# Patient Record
Sex: Female | Born: 1995 | Race: Black or African American | Hispanic: No | Marital: Single | Smoking: Never smoker
Health system: Southern US, Community
[De-identification: ages and names within clinical notes are randomized; demographics above are authoritative.]

## PROBLEM LIST (undated history)

## (undated) ENCOUNTER — Inpatient Hospital Stay (HOSPITAL_COMMUNITY): Payer: Self-pay

## (undated) DIAGNOSIS — F32A Depression, unspecified: Secondary | ICD-10-CM

## (undated) DIAGNOSIS — E119 Type 2 diabetes mellitus without complications: Secondary | ICD-10-CM

## (undated) DIAGNOSIS — G40909 Epilepsy, unspecified, not intractable, without status epilepticus: Secondary | ICD-10-CM

## (undated) DIAGNOSIS — F329 Major depressive disorder, single episode, unspecified: Secondary | ICD-10-CM

## (undated) HISTORY — PX: NO PAST SURGERIES: SHX2092

---
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
## 2017-03-28 LAB — OB RESULTS CONSOLE ANTIBODY SCREEN: ANTIBODY SCREEN: NEGATIVE

## 2017-03-28 LAB — SICKLE CELL SCREEN: SICKLE CELL SCREEN: NEGATIVE

## 2017-03-28 LAB — OB RESULTS CONSOLE TSH: TSH: 0.818

## 2017-03-28 LAB — OB RESULTS CONSOLE HIV ANTIBODY (ROUTINE TESTING): HIV: NONREACTIVE

## 2017-03-28 LAB — OB RESULTS CONSOLE ABO/RH: RH Type: POSITIVE

## 2017-03-28 LAB — OB RESULTS CONSOLE HGB/HCT, BLOOD
HEMATOCRIT: 39
HEMOGLOBIN: 13.1

## 2017-03-28 LAB — OB RESULTS CONSOLE RPR: RPR: NONREACTIVE

## 2017-03-28 LAB — OB RESULTS CONSOLE GC/CHLAMYDIA
CHLAMYDIA, DNA PROBE: NEGATIVE
GC PROBE AMP, GENITAL: NEGATIVE

## 2017-03-28 LAB — OB RESULTS CONSOLE RUBELLA ANTIBODY, IGM: Rubella: IMMUNE

## 2017-03-28 LAB — OB RESULTS CONSOLE PLATELET COUNT: Platelets: 264

## 2017-03-28 LAB — OB RESULTS CONSOLE HEPATITIS B SURFACE ANTIGEN: Hepatitis B Surface Ag: NEGATIVE

## 2017-05-28 NOTE — L&D Delivery Note (Signed)
Patient: Samantha Bowers MRN: [id number]  GBS status: positive, IAP given (PCN x 3 doses)  Patient is a 22 y.o. now G1P1001 s/p NSVD at [redacted]w[redacted]d, who was admitted for IOL for T2DM. S/p IOL with IV Pitocin. SROM 4h 8m prior to delivery with clar fluid.   Delivery Note At 5:56 PM a viable female was delivered via Vaginal, Spontaneous (Presentation: vertex; OA ).  APGAR: 5, 9; weight 8 lb 14.7 oz (4045 g).   Placenta status: intact, sent to pathology.  Cord: 3-vessel  with the following complications: shoulder dystocia.  Cord pH: 7.23  Head delivered OA and shoulder dystocia immediately recognized. Patient already in McRobert's, suprapubic pressure applied, then rotation with Rubins maneuver attempted, followed by attempt to deliver posterior shoulder, but with some difficulty. Dr. Charlotta Newton arrived for assistance and was able to deliver posterior shoulder, and body followed quickly. Infant bulb suctioned, and cord clamped and cut by without delay, and handed to awaiting neonatal team. Cord blood and arterial gas drawn. IV Pitocin started. Placenta delivered spontaneously with gentle cord traction.  Brisk bleeding noted, and fundus found to have poor tone. Bimanual massage done, IM methergine, misoprostol (400 mcg buccal and 400 mcg rectally) given. Tone improved with continued bimanual massage, but some continuous bleeding noted, so TXA 1g also given. 2nd degree perineal repaired with 3.0 Monocryl with good hemostasis achieved.  Anesthesia:  Epidural Episiotomy: None Lacerations: 2nd degree;Perineal Suture Repair: 3.0 Monocryl Est. Blood Loss (mL): 700  Mom to postpartum.  Baby to Couplet care / Skin to Skin.  Raynelle Fanning P. Gloris Shiroma, MD OB Fellow 10/10/17, 7:19 PM

## 2017-06-17 ENCOUNTER — Encounter (HOSPITAL_COMMUNITY): Payer: Self-pay | Admitting: Emergency Medicine

## 2017-06-17 ENCOUNTER — Other Ambulatory Visit: Payer: Self-pay

## 2017-06-17 ENCOUNTER — Emergency Department (HOSPITAL_COMMUNITY)
Admission: EM | Admit: 2017-06-17 | Discharge: 2017-06-17 | Disposition: A | Discharge status: Home / Self Care | Payer: Medicaid Other | Attending: Emergency Medicine | Admitting: Emergency Medicine

## 2017-06-17 DIAGNOSIS — K0889 Other specified disorders of teeth and supporting structures: Secondary | ICD-10-CM | POA: Diagnosis not present

## 2017-06-17 DIAGNOSIS — E119 Type 2 diabetes mellitus without complications: Secondary | ICD-10-CM | POA: Insufficient documentation

## 2017-06-17 HISTORY — DX: Type 2 diabetes mellitus without complications: E11.9

## 2017-06-17 MED ORDER — PENICILLIN V POTASSIUM 500 MG PO TABS: 500.0000 mg | ORAL_TABLET | Freq: Four times a day (QID) | ORAL | 0 refills | Status: AC

## 2017-06-17 NOTE — Discharge Instructions (Signed)
Please take Tylenol for pain Take Penicillin for possible infection. Take 4 times a day for the next week. A dental resource guide has been provided

## 2017-06-17 NOTE — ED Provider Notes (Signed)
[facility] EMERGENCY DEPARTMENT Provider Note   CSN: [id number] Arrival date & time: 06/17/17  1256     History   Chief Complaint Chief Complaint  Patient presents with  . Dental Pain    HPI Samantha Bowers is a 22 y.o. female who presents with left lower dental pain.  Patient is currently 5 months pregnant.  She states that filling fell out of her tooth about a month ago.  Over the past couple days it has been gradually worsening and she is noticed a small amount of swelling over her jawline. She has pain when she eats on that side but is able to swallow and does not have any shortness of breath. She has not taken anything for pain. She recently moved to the area and does not have a dentist.  She tried to get in with a dentist however was told that she did not have coverage through her Medicaid.  HPI  Past Medical History:  Diagnosis Date  . Diabetes mellitus without complication (HCC)     There are no active problems to display for this patient.   The histories are not reviewed yet. Please review them in the "History" navigator section and refresh this SmartLink.  OB History    Gravida Para Term Preterm AB Living   1             SAB TAB Ectopic Multiple Live Births                   Home Medications    Prior to Admission medications   Medication Sig Start Date End Date Taking? Authorizing Provider  penicillin v potassium (VEETID) 500 MG tablet Take 1 tablet (500 mg total) by mouth 4 (four) times daily for 7 days. 06/17/17 06/24/17  Bethel Born, PA-C    Family History No family history on file.  Social History Social History   Tobacco Use  . Smoking status: Never Smoker  Substance Use Topics  . Alcohol use: Not on file  . Drug use: Not on file     Allergies   Patient has no known allergies.   Review of Systems Review of Systems  Constitutional: Negative for fever.  HENT: Positive for dental problem.      Physical  Exam Updated Vital Signs BP 120/85 (BP Location: Right Arm)   Pulse 84   Temp 99.1 F (37.3 C) (Oral)   Resp 17   Ht 6' (1.829 m)   Wt 99.8 kg (220 lb)   SpO2 98%   BMI 29.84 kg/m   Physical Exam  Constitutional: She is oriented to person, place, and time. She appears well-developed and well-nourished. No distress.  HENT:  Head: Normocephalic and atraumatic.  Left lower posterior molar has filling missing. No obvious signs of infection or abscess  Eyes: Conjunctivae are normal. Pupils are equal, round, and reactive to light. Right eye exhibits no discharge. Left eye exhibits no discharge. No scleral icterus.  Neck: Normal range of motion.  Cardiovascular: Normal rate.  Pulmonary/Chest: Effort normal. No respiratory distress.  Abdominal: She exhibits no distension.  Neurological: She is alert and oriented to person, place, and time.  Skin: Skin is warm and dry.  Psychiatric: She has a normal mood and affect. Her behavior is normal.  Nursing note and vitals reviewed.    ED Treatments / Results  Labs (all labs ordered are listed, but only abnormal results are displayed) Labs Reviewed - No data to display  EKG  EKG Interpretation None       Radiology No results found.  Procedures Procedures (including critical care time)  Medications Ordered in ED Medications - No data to display   Initial Impression / Assessment and Plan / ED Course  I have reviewed the triage vital signs and the nursing notes.  Pertinent labs & imaging results that were available during my care of the patient were reviewed by me and considered in my medical decision making (see chart for details).  Dental pain due to missing filling and possible dental infection. Patient is afebrile, non toxic appearing, and swallowing secretions well. I gave patient referral to dentist and stressed the importance of dental follow up for ultimate management of dental pain. She asked me to pull her tooth or fill  it and I advised that we do not do that in the ED. She was advised to take Tylenol for pain and was given PCN rx. Patient expresses understanding and agrees with plan.   Final Clinical Impressions(s) / ED Diagnoses   Final diagnoses:  Pain, dental    ED Discharge Orders        Ordered    penicillin v potassium (VEETID) 500 MG tablet  4 times daily     06/17/17 1635       Bethel BornGekas, Kelly Marie, PA-C 06/17/17 1645    Tilden Fossaees, Elizabeth, MD 06/17/17 2041

## 2017-06-17 NOTE — ED Triage Notes (Addendum)
Pt had a cavity filling fall out 1 month ago.has pain to left lower tooth that started 2 days ago. Does not have dentist, needs referral. Currently 5 mths pregnant.

## 2017-06-17 NOTE — ED Notes (Signed)
Pt states she understands instructions. Home stable with steady gait. 

## 2017-07-02 ENCOUNTER — Encounter: Payer: Self-pay | Admitting: *Deleted

## 2017-07-12 ENCOUNTER — Ambulatory Visit (INDEPENDENT_AMBULATORY_CARE_PROVIDER_SITE_OTHER): Payer: Medicaid Other | Admitting: Obstetrics and Gynecology

## 2017-07-12 ENCOUNTER — Encounter: Payer: Self-pay | Admitting: Obstetrics and Gynecology

## 2017-07-12 ENCOUNTER — Encounter: Payer: Self-pay | Admitting: General Practice

## 2017-07-12 VITALS — BP 128/70 | HR 83 | Wt 240.6 lb

## 2017-07-12 DIAGNOSIS — O2342 Unspecified infection of urinary tract in pregnancy, second trimester: Principal | ICD-10-CM

## 2017-07-12 DIAGNOSIS — Z23 Encounter for immunization: Secondary | ICD-10-CM

## 2017-07-12 DIAGNOSIS — O2340 Unspecified infection of urinary tract in pregnancy, unspecified trimester: Secondary | ICD-10-CM

## 2017-07-12 DIAGNOSIS — E1165 Type 2 diabetes mellitus with hyperglycemia: Secondary | ICD-10-CM | POA: Insufficient documentation

## 2017-07-12 DIAGNOSIS — O98812 Other maternal infectious and parasitic diseases complicating pregnancy, second trimester: Secondary | ICD-10-CM

## 2017-07-12 DIAGNOSIS — B951 Streptococcus, group B, as the cause of diseases classified elsewhere: Secondary | ICD-10-CM | POA: Insufficient documentation

## 2017-07-12 DIAGNOSIS — O0990 Supervision of high risk pregnancy, unspecified, unspecified trimester: Secondary | ICD-10-CM | POA: Insufficient documentation

## 2017-07-12 DIAGNOSIS — O24113 Pre-existing diabetes mellitus, type 2, in pregnancy, third trimester: Secondary | ICD-10-CM

## 2017-07-12 DIAGNOSIS — O98819 Other maternal infectious and parasitic diseases complicating pregnancy, unspecified trimester: Secondary | ICD-10-CM

## 2017-07-12 DIAGNOSIS — O24111 Pre-existing diabetes mellitus, type 2, in pregnancy, first trimester: Secondary | ICD-10-CM | POA: Diagnosis not present

## 2017-07-12 DIAGNOSIS — O0991 Supervision of high risk pregnancy, unspecified, first trimester: Secondary | ICD-10-CM

## 2017-07-12 DIAGNOSIS — O2341 Unspecified infection of urinary tract in pregnancy, first trimester: Secondary | ICD-10-CM

## 2017-07-12 DIAGNOSIS — Z9119 Patient's noncompliance with other medical treatment and regimen: Secondary | ICD-10-CM

## 2017-07-12 DIAGNOSIS — O24119 Pre-existing diabetes mellitus, type 2, in pregnancy, unspecified trimester: Secondary | ICD-10-CM

## 2017-07-12 DIAGNOSIS — O98811 Other maternal infectious and parasitic diseases complicating pregnancy, first trimester: Secondary | ICD-10-CM

## 2017-07-12 DIAGNOSIS — A749 Chlamydial infection, unspecified: Secondary | ICD-10-CM

## 2017-07-12 DIAGNOSIS — Z91199 Patient's noncompliance with other medical treatment and regimen due to unspecified reason: Secondary | ICD-10-CM

## 2017-07-12 DIAGNOSIS — R7309 Other abnormal glucose: Secondary | ICD-10-CM | POA: Insufficient documentation

## 2017-07-12 LAB — POCT URINALYSIS DIP (DEVICE)
BILIRUBIN URINE: NEGATIVE
Hgb urine dipstick: NEGATIVE
Ketones, ur: NEGATIVE mg/dL
Leukocytes, UA: NEGATIVE
Nitrite: NEGATIVE
Protein, ur: NEGATIVE mg/dL
SPECIFIC GRAVITY, URINE: 1.02 (ref 1.005–1.030)
Urobilinogen, UA: 0.2 mg/dL (ref 0.0–1.0)
pH: 6.5 (ref 5.0–8.0)

## 2017-07-12 MED ORDER — INSULIN LISPRO 100 UNIT/ML CARTRIDGE: 12.0000 [IU] | Freq: Three times a day (TID) | SUBCUTANEOUS | 11 refills | Status: DC

## 2017-07-12 MED ORDER — INSULIN DETEMIR 100 UNIT/ML ~~LOC~~ SOLN: 15.0000 [IU] | Freq: Every day | SUBCUTANEOUS | Status: DC

## 2017-07-12 NOTE — Progress Notes (Signed)
New OB Note  07/12/2017   [facility]  Chief Complaint: transfer of care from [facility] [place]   History of Present Illness: [name] is a 22 y.o. G1P0000 @ 26/0 weeks (EDC 5/24, based on 8wk u/) Patient's last menstrual period was 01/10/2017 (exact date). Preg complicated by has GBS (group B streptococcus) UTI complicating pregnancy; Type 2 diabetes mellitus affecting pregnancy, antepartum; Chlamydia infection complicating pregnancy; and Supervision of high risk pregnancy, antepartum on their problem list.   No s/s of PTL or decreased FM  ROS: A 12-point review of systems was performed and negative, except as stated in the above HPI.  OBGYN History: As per HPI. OB History  Gravida Para Term Preterm AB Living  1 0 0 0 0 0  SAB TAB Ectopic Multiple Live Births  0 0 0 0 0    # Outcome Date GA Lbr Len/2nd Weight Sex Delivery Anes PTL Lv  1 Current                Past Medical History: Past Medical History:  Diagnosis Date  . Diabetes mellitus without complication [facility](HCC)     Past Surgical History: Past Surgical History:  Procedure Laterality Date  . NO PAST SURGERIES      Family History:  Family History  Problem Relation Age of Onset  . Diabetes Mother   . Diabetes Father     Social History:  Social History   Socioeconomic History  . Marital status: Single    Spouse name: Not on file  . Number of children: Not on file  . Years of education: Not on file  . Highest education level: Not on file  Social Needs  . Financial resource strain: Not on file  . Food insecurity - worry: Not on file  . Food insecurity - inability: Not on file  . Transportation needs - medical: Not on file  . Transportation needs - non-medical: Not on file  Occupational History  . Not on file  Tobacco Use  . Smoking status: Never Smoker  . Smokeless tobacco: Never Used  Substance and Sexual Activity  . Alcohol use: No    Frequency: Never  . Drug use: No  .  Sexual activity: Yes    Birth control/protection: Injection  Other Topics Concern  . Not on file  Social History Narrative  . Not on file    Allergy: No Known Allergies  Current Outpatient Medications: PN gummies humalog 05/09/11 Levemir 15 quhs  Physical Exam:   BP 128/70   Pulse 83   Wt 240 lb 9.6 oz (109.1 kg)   LMP 01/10/2017 (Exact Date)   BMI 32.63 kg/m  Body mass index is 32.63 kg/m. Contractions: Not present Vag. Bleeding: Bloody Show. Fundal height: 27 FHTs: 150s  General appearance: Well nourished, well developed female in no acute distress.   Laboratory: See care everywhere  Imaging:  See media tab. S/p normal anatomy u/s  Assessment: pt stable  Plan: 1. Supervision of high risk pregnancy, antepartum Routine care. Not on low dose ASA, too late to start at this point.   2. Type 2 diabetes mellitus affecting pregnancy, antepartum Pt only doing am fasting checks which are in the 120s. I told her re: risk of not having good BS compliance, including IUFD. I told her and sheet given am fasting and 2hr PP checks and #s to aim for. Will increase qhs levemir to 15 and pt told to check meal coverage at 12 b/c we don't  know what her PP #s are. Pt states that she didn't keep fetal echo appt in [place] so will set one up for her. Also will set her up for growth u/s here and ophtho exam. Will get a1c today. Was 8.4 on 10/19. Start ap testing at 32wks, delivery at 39wks -[facility] 4/16 @ 13:45 -dr Elizebeth Brooking 3/12 @ 11am  3. GBS bacteruria tx in labor. toc neg  4. H/o CT Negative TOC. Retest later in pregnancy  5. BMI 30s  Problem list reviewed and updated.  Follow up in 1 weeks.  >50% of 25 min visit spent on counseling and coordination of care.     Cornelia Copa MD Attending Center for [facility])

## 2017-07-12 NOTE — Progress Notes (Signed)
Fetal Echo scheduled w/ Dr Elizebeth Brookingotton on 08/06/17 @ 11am

## 2017-07-12 NOTE — Progress Notes (Signed)
Flu declined,Tdap accepted 07/12/16 in right arm @ 9:20

## 2017-07-12 NOTE — Progress Notes (Signed)
[facility] @ 145pm

## 2017-07-13 LAB — HEMOGLOBIN A1C
ESTIMATED AVERAGE GLUCOSE: 163 mg/dL
Hgb A1c MFr Bld: 7.3 % — ABNORMAL HIGH (ref 4.8–5.6)

## 2017-07-15 ENCOUNTER — Encounter (HOSPITAL_COMMUNITY): Payer: Self-pay | Admitting: Obstetrics and Gynecology

## 2017-07-18 ENCOUNTER — Ambulatory Visit (INDEPENDENT_AMBULATORY_CARE_PROVIDER_SITE_OTHER): Payer: Medicaid Other | Admitting: Obstetrics & Gynecology

## 2017-07-18 VITALS — BP 114/57 | HR 90 | Wt 245.5 lb

## 2017-07-18 DIAGNOSIS — O0990 Supervision of high risk pregnancy, unspecified, unspecified trimester: Secondary | ICD-10-CM

## 2017-07-18 DIAGNOSIS — O24119 Pre-existing diabetes mellitus, type 2, in pregnancy, unspecified trimester: Secondary | ICD-10-CM

## 2017-07-18 MED ORDER — INSULIN NPH (HUMAN) (ISOPHANE) 100 UNIT/ML ~~LOC~~ SUSP: SUBCUTANEOUS | 3 refills | Status: DC

## 2017-07-18 MED ORDER — INSULIN LISPRO 100 UNIT/ML CARTRIDGE: 15.0000 [IU] | Freq: Three times a day (TID) | SUBCUTANEOUS | 11 refills | Status: DC

## 2017-07-18 NOTE — Progress Notes (Signed)
Needs refills on insulin

## 2017-07-18 NOTE — Progress Notes (Signed)
   PRENATAL VISIT NOTE  Subjective:  Samantha Bowers is a 22 y.o. G1P0000 at 6062w0d being seen today for ongoing prenatal care.  She is currently monitored for the following issues for this high-risk pregnancy and has GBS (group B streptococcus) UTI complicating pregnancy; Type 2 diabetes mellitus affecting pregnancy, antepartum; Chlamydia infection complicating pregnancy; Supervision of high risk pregnancy, antepartum; and Noncompliance on their problem list.  Patient reports no complaints.  Contractions: Not present. Vag. Bleeding: None.  Movement: Present. Denies leaking of fluid.   The following portions of the patient's history were reviewed and updated as appropriate: allergies, current medications, past family history, past medical history, past social history, past surgical history and problem list. Problem list updated.  Objective:   Vitals:   07/18/17 0826  BP: (!) 114/57  Pulse: 90  Weight: 245 lb 8 oz (111.4 kg)    Fetal Status: Fetal Heart Rate (bpm): 145   Movement: Present     General:  Alert, oriented and cooperative. Patient is in no acute distress.  Skin: Skin is warm and dry. No rash noted.   Cardiovascular: Normal heart rate noted  Respiratory: Normal respiratory effort, no problems with respiration noted  Abdomen: Soft, gravid, appropriate for gestational age.  Pain/Pressure: Absent     Pelvic: Cervical exam deferred        Extremities: Normal range of motion.  Edema: None  Mental Status:  Normal mood and affect. Normal behavior. Normal judgment and thought content.   Assessment and Plan:  Pregnancy: G1P0000 at 6362w0d  1. Supervision of high risk pregnancy, antepartum FBS up to 150, PP above 200, change insulin dose and preparation - HIV antibody (with reflex) - RPR - CBC - insulin lispro (HUMALOG) 100 UNIT/ML cartridge; Inject 0.15 mLs (15 Units total) into the skin 3 (three) times daily with meals.  Dispense: 15 mL; Refill: 11 - insulin NPH Human (HUMULIN  N,NOVOLIN N) 100 UNIT/ML injection; 20 units [place] AC breakfast and 15 units [place] at bedtime  Dispense: 10 mL; Refill: 3  2. Type 2 diabetes mellitus affecting pregnancy, antepartum Go over dietary restrictions  Preterm labor symptoms and general obstetric precautions including but not limited to vaginal bleeding, contractions, leaking of fluid and fetal movement were reviewed in detail with the patient. Please refer to After Visit Summary for other counseling recommendations.  Return in about 1 week (around 07/25/2017).   Scheryl DarterJames Arnold, MD

## 2017-07-18 NOTE — Patient Instructions (Signed)

## 2017-07-19 ENCOUNTER — Ambulatory Visit (HOSPITAL_COMMUNITY)
Admission: RE | Admit: 2017-07-19 | Discharge: 2017-07-19 | Disposition: A | Discharge status: Home / Self Care | Payer: Medicaid Other | Source: Ambulatory Visit | Attending: Obstetrics and Gynecology | Admitting: Obstetrics and Gynecology

## 2017-07-19 ENCOUNTER — Other Ambulatory Visit: Payer: Self-pay | Admitting: Obstetrics and Gynecology

## 2017-07-19 ENCOUNTER — Encounter (HOSPITAL_COMMUNITY): Payer: Self-pay

## 2017-07-19 ENCOUNTER — Other Ambulatory Visit (HOSPITAL_COMMUNITY): Payer: Self-pay | Admitting: *Deleted

## 2017-07-19 DIAGNOSIS — A749 Chlamydial infection, unspecified: Secondary | ICD-10-CM

## 2017-07-19 DIAGNOSIS — O0990 Supervision of high risk pregnancy, unspecified, unspecified trimester: Secondary | ICD-10-CM

## 2017-07-19 DIAGNOSIS — O98819 Other maternal infectious and parasitic diseases complicating pregnancy, unspecified trimester: Secondary | ICD-10-CM

## 2017-07-19 DIAGNOSIS — Z3A27 27 weeks gestation of pregnancy: Secondary | ICD-10-CM | POA: Insufficient documentation

## 2017-07-19 DIAGNOSIS — O24119 Pre-existing diabetes mellitus, type 2, in pregnancy, unspecified trimester: Secondary | ICD-10-CM

## 2017-07-19 DIAGNOSIS — O24414 Gestational diabetes mellitus in pregnancy, insulin controlled: Secondary | ICD-10-CM

## 2017-07-19 DIAGNOSIS — Z3689 Encounter for other specified antenatal screening: Secondary | ICD-10-CM | POA: Insufficient documentation

## 2017-07-19 DIAGNOSIS — O09892 Supervision of other high risk pregnancies, second trimester: Secondary | ICD-10-CM | POA: Insufficient documentation

## 2017-07-19 DIAGNOSIS — B951 Streptococcus, group B, as the cause of diseases classified elsewhere: Secondary | ICD-10-CM

## 2017-07-19 DIAGNOSIS — O2340 Unspecified infection of urinary tract in pregnancy, unspecified trimester: Secondary | ICD-10-CM

## 2017-07-19 DIAGNOSIS — O24312 Unspecified pre-existing diabetes mellitus in pregnancy, second trimester: Secondary | ICD-10-CM | POA: Insufficient documentation

## 2017-07-19 LAB — CBC
HEMATOCRIT: 36.5 % (ref 34.0–46.6)
Hemoglobin: 11.9 g/dL (ref 11.1–15.9)
MCH: 30.3 pg (ref 26.6–33.0)
MCHC: 32.6 g/dL (ref 31.5–35.7)
MCV: 93 fL (ref 79–97)
PLATELETS: 230 10*3/uL (ref 150–379)
RBC: 3.93 x10E6/uL (ref 3.77–5.28)
RDW: 13.6 % (ref 12.3–15.4)
WBC: 7.4 10*3/uL (ref 3.4–10.8)

## 2017-07-19 LAB — HIV ANTIBODY (ROUTINE TESTING W REFLEX): HIV Screen 4th Generation wRfx: NONREACTIVE

## 2017-07-19 LAB — RPR: RPR: NONREACTIVE

## 2017-07-22 ENCOUNTER — Encounter: Payer: Self-pay | Admitting: *Deleted

## 2017-07-23 ENCOUNTER — Other Ambulatory Visit: Payer: Medicaid Other

## 2017-07-24 ENCOUNTER — Other Ambulatory Visit: Payer: Self-pay | Admitting: General Practice

## 2017-07-24 ENCOUNTER — Encounter: Payer: Self-pay | Admitting: Obstetrics & Gynecology

## 2017-07-24 DIAGNOSIS — O24119 Pre-existing diabetes mellitus, type 2, in pregnancy, unspecified trimester: Secondary | ICD-10-CM

## 2017-07-24 MED ORDER — "INSULIN SYRINGE 31G X 5/16"" 0.5 ML MISC": 1.0000 | Freq: Every day | 9 refills | Status: DC

## 2017-07-29 ENCOUNTER — Encounter: Payer: Self-pay | Admitting: Family Medicine

## 2017-07-29 ENCOUNTER — Ambulatory Visit (INDEPENDENT_AMBULATORY_CARE_PROVIDER_SITE_OTHER): Payer: Medicaid Other | Admitting: Family Medicine

## 2017-07-29 VITALS — BP 117/61 | HR 100 | Wt 250.8 lb

## 2017-07-29 DIAGNOSIS — O2340 Unspecified infection of urinary tract in pregnancy, unspecified trimester: Secondary | ICD-10-CM

## 2017-07-29 DIAGNOSIS — O24119 Pre-existing diabetes mellitus, type 2, in pregnancy, unspecified trimester: Secondary | ICD-10-CM

## 2017-07-29 DIAGNOSIS — O0990 Supervision of high risk pregnancy, unspecified, unspecified trimester: Secondary | ICD-10-CM

## 2017-07-29 DIAGNOSIS — B951 Streptococcus, group B, as the cause of diseases classified elsewhere: Secondary | ICD-10-CM

## 2017-07-29 LAB — POCT URINALYSIS DIP (DEVICE)
BILIRUBIN URINE: NEGATIVE
Glucose, UA: 500 mg/dL — AB
KETONES UR: NEGATIVE mg/dL
LEUKOCYTES UA: NEGATIVE
Nitrite: NEGATIVE
PH: 6 (ref 5.0–8.0)
Protein, ur: NEGATIVE mg/dL
Specific Gravity, Urine: 1.02 (ref 1.005–1.030)
Urobilinogen, UA: 0.2 mg/dL (ref 0.0–1.0)

## 2017-07-29 MED ORDER — INSULIN NPH (HUMAN) (ISOPHANE) 100 UNIT/ML ~~LOC~~ SUSP: SUBCUTANEOUS | 3 refills | Status: DC

## 2017-07-29 NOTE — Patient Instructions (Signed)
Increase NPH to 30units in AM and 20units in PM

## 2017-07-29 NOTE — Progress Notes (Signed)
Subjective:  Samantha Bowers is a [age] y.o. G1P0000 at 5167w4d being seen today for ongoing prenatal care.  She is currently monitored for the following issues for this high-risk pregnancy and has GBS (group B streptococcus) UTI complicating pregnancy; Type 2 diabetes mellitus affecting pregnancy, antepartum; Chlamydia infection complicating pregnancy; Supervision of high risk pregnancy, antepartum; and Noncompliance on their problem list.  GDM: Patient taking NPH and humalog.  Reports no hypoglycemic episodes.  Tolerating medication well. Patient had numbers written in phone, which broke Fasting: multiple high  2hr PP: multiple really high blood sugars 180 -200 range  Patient reports no complaints.  Contractions: Not present. Vag. Bleeding: None.  Movement: Present. Denies leaking of fluid.   The following portions of the patient's history were reviewed and updated as appropriate: allergies, current medications, past family history, past medical history, past social history, past surgical history and problem list. Problem list updated.  Objective:   Vitals:   07/29/17 1350  BP: 117/61  Pulse: 100  Weight: 250 lb 12.8 oz (113.8 kg)    Fetal Status: Fetal Heart Rate (bpm): 150   Movement: Present     General:  Alert, oriented and cooperative. Patient is in no acute distress.  Skin: Skin is warm and dry. No rash noted.   Cardiovascular: Normal heart rate noted  Respiratory: Normal respiratory effort, no problems with respiration noted  Abdomen: Soft, gravid, appropriate for gestational age. Pain/Pressure: Present     Pelvic: Vag. Bleeding: None     Cervical exam deferred        Extremities: Normal range of motion.  Edema: None  Mental Status: Normal mood and affect. Normal behavior. Normal judgment and thought content.   Urinalysis:      Assessment and Plan:  Pregnancy: G1P0000 at 3667w4d  1. Supervision of high risk pregnancy, antepartum FHT and FH normal  2. Group B Streptococcus  urinary tract infection affecting pregnancy, antepartum Intrapartum prophylaxis  3. Type 2 diabetes mellitus affecting pregnancy, antepartum Increase NPH to 30units in AM and 20units in PM Antenatal testing at 32 weeks US for growth on 3/22  Preterm labor symptoms and general obstetric precautions including but not limited to vaginal bleeding, contractions, leaking of fluid and fetal movement were reviewed in detail with the patient. Please refer to After Visit Summary for other counseling recommendations.  No Follow-up on file.   Levie HeritageStinson, Rodell Marrs J, DO

## 2017-08-01 ENCOUNTER — Encounter: Payer: Self-pay | Admitting: Family Medicine

## 2017-08-05 ENCOUNTER — Encounter: Payer: Self-pay | Admitting: Family Medicine

## 2017-08-09 ENCOUNTER — Encounter: Payer: Medicaid Other | Admitting: Family Medicine

## 2017-08-12 ENCOUNTER — Ambulatory Visit (INDEPENDENT_AMBULATORY_CARE_PROVIDER_SITE_OTHER): Payer: Medicaid Other | Admitting: Family Medicine

## 2017-08-12 VITALS — BP 118/66 | HR 92 | Wt 253.9 lb

## 2017-08-12 DIAGNOSIS — O0990 Supervision of high risk pregnancy, unspecified, unspecified trimester: Secondary | ICD-10-CM

## 2017-08-12 DIAGNOSIS — O24119 Pre-existing diabetes mellitus, type 2, in pregnancy, unspecified trimester: Secondary | ICD-10-CM

## 2017-08-12 NOTE — Progress Notes (Signed)
Subjective:  Samantha Bowers is a 22 y.o. G1P0000 at 2450w4d being seen today for ongoing prenatal care.  She is currently monitored for the following issues for this high-risk pregnancy and has GBS (group B streptococcus) UTI complicating pregnancy; Type 2 diabetes mellitus affecting pregnancy, antepartum; Chlamydia infection complicating pregnancy; Supervision of high risk pregnancy, antepartum; and Noncompliance on their problem list.  GDM: Patient taking Lispro 12 units TID with meals and NPH 30 unit AM, 20 qhs.  Reports no hypoglycemic episodes.  Tolerating medication well Fasting: 90-94 2hr PP: 150-160  Patient reports no complaints.  Contractions: Not present. Vag. Bleeding: None.  Movement: Present. Denies leaking of fluid.   The following portions of the patient's history were reviewed and updated as appropriate: allergies, current medications, past family history, past medical history, past social history, past surgical history and problem list. Problem list updated.  Objective:   Vitals:   08/12/17 1614  BP: 118/66  Pulse: 92  Weight: 253 lb 14.4 oz (115.2 kg)    Fetal Status: Fetal Heart Rate (bpm): 147   Movement: Present     General:  Alert, oriented and cooperative. Patient is in no acute distress.  Skin: Skin is warm and dry. No rash noted.   Cardiovascular: Normal heart rate noted  Respiratory: Normal respiratory effort, no problems with respiration noted  Abdomen: Soft, gravid, appropriate for gestational age. Pain/Pressure: Absent     Pelvic: Vag. Bleeding: None     Cervical exam deferred        Extremities: Normal range of motion.  Edema: Trace  Mental Status: Normal mood and affect. Normal behavior. Normal judgment and thought content.   Urinalysis:      Assessment and Plan:  Pregnancy: G1P0000 at 3450w4d  1. Type 2 diabetes mellitus affecting pregnancy, antepartum Increase lispro to 15 units with meals. Continue NPH 30/20 units. US this Friday Antenatal  testing at 32 weeks.  2. Supervision of high risk pregnancy, antepartum FH and FHT normal  Preterm labor symptoms and general obstetric precautions including but not limited to vaginal bleeding, contractions, leaking of fluid and fetal movement were reviewed in detail with the patient. Please refer to After Visit Summary for other counseling recommendations.  No Follow-up on file.   Levie HeritageStinson, Jacob J, DO

## 2017-08-13 ENCOUNTER — Encounter: Payer: Self-pay | Admitting: *Deleted

## 2017-08-13 LAB — POCT URINALYSIS DIP (DEVICE)
BILIRUBIN URINE: NEGATIVE
Glucose, UA: 500 mg/dL — AB
HGB URINE DIPSTICK: NEGATIVE
Ketones, ur: NEGATIVE mg/dL
NITRITE: NEGATIVE
PH: 7 (ref 5.0–8.0)
Protein, ur: NEGATIVE mg/dL
Specific Gravity, Urine: 1.015 (ref 1.005–1.030)
UROBILINOGEN UA: 0.2 mg/dL (ref 0.0–1.0)

## 2017-08-16 ENCOUNTER — Ambulatory Visit (HOSPITAL_COMMUNITY)
Admission: RE | Admit: 2017-08-16 | Discharge: 2017-08-16 | Disposition: A | Discharge status: Home / Self Care | Payer: Medicaid Other | Source: Ambulatory Visit | Attending: Obstetrics and Gynecology | Admitting: Obstetrics and Gynecology

## 2017-08-16 ENCOUNTER — Other Ambulatory Visit (HOSPITAL_COMMUNITY): Payer: Self-pay | Admitting: Maternal and Fetal Medicine

## 2017-08-16 ENCOUNTER — Encounter (HOSPITAL_COMMUNITY): Payer: Self-pay

## 2017-08-16 ENCOUNTER — Other Ambulatory Visit (HOSPITAL_COMMUNITY): Payer: Self-pay | Admitting: *Deleted

## 2017-08-16 DIAGNOSIS — Z0489 Encounter for examination and observation for other specified reasons: Secondary | ICD-10-CM

## 2017-08-16 DIAGNOSIS — O24313 Unspecified pre-existing diabetes mellitus in pregnancy, third trimester: Secondary | ICD-10-CM | POA: Insufficient documentation

## 2017-08-16 DIAGNOSIS — Z362 Encounter for other antenatal screening follow-up: Secondary | ICD-10-CM | POA: Diagnosis not present

## 2017-08-16 DIAGNOSIS — O24414 Gestational diabetes mellitus in pregnancy, insulin controlled: Secondary | ICD-10-CM | POA: Diagnosis present

## 2017-08-16 DIAGNOSIS — IMO0002 Reserved for concepts with insufficient information to code with codable children: Secondary | ICD-10-CM

## 2017-08-16 DIAGNOSIS — Z3A31 31 weeks gestation of pregnancy: Secondary | ICD-10-CM | POA: Insufficient documentation

## 2017-08-22 ENCOUNTER — Ambulatory Visit: Payer: Self-pay

## 2017-08-22 ENCOUNTER — Encounter: Payer: Self-pay | Admitting: Family Medicine

## 2017-08-22 ENCOUNTER — Ambulatory Visit (INDEPENDENT_AMBULATORY_CARE_PROVIDER_SITE_OTHER): Payer: Medicaid Other | Admitting: Family Medicine

## 2017-08-22 VITALS — BP 100/81 | HR 103 | Wt 251.0 lb

## 2017-08-22 DIAGNOSIS — O24113 Pre-existing diabetes mellitus, type 2, in pregnancy, third trimester: Secondary | ICD-10-CM

## 2017-08-22 DIAGNOSIS — O24119 Pre-existing diabetes mellitus, type 2, in pregnancy, unspecified trimester: Secondary | ICD-10-CM

## 2017-08-22 DIAGNOSIS — O0990 Supervision of high risk pregnancy, unspecified, unspecified trimester: Secondary | ICD-10-CM

## 2017-08-22 DIAGNOSIS — O2343 Unspecified infection of urinary tract in pregnancy, third trimester: Secondary | ICD-10-CM | POA: Diagnosis not present

## 2017-08-22 DIAGNOSIS — O2340 Unspecified infection of urinary tract in pregnancy, unspecified trimester: Secondary | ICD-10-CM

## 2017-08-22 DIAGNOSIS — O0993 Supervision of high risk pregnancy, unspecified, third trimester: Secondary | ICD-10-CM | POA: Diagnosis present

## 2017-08-22 DIAGNOSIS — B951 Streptococcus, group B, as the cause of diseases classified elsewhere: Secondary | ICD-10-CM

## 2017-08-22 LAB — POCT URINALYSIS DIP (DEVICE)
Bilirubin Urine: NEGATIVE
GLUCOSE, UA: 500 mg/dL — AB
Hgb urine dipstick: NEGATIVE
KETONES UR: NEGATIVE mg/dL
Nitrite: NEGATIVE
PROTEIN: NEGATIVE mg/dL
SPECIFIC GRAVITY, URINE: 1.01 (ref 1.005–1.030)
UROBILINOGEN UA: 0.2 mg/dL (ref 0.0–1.0)
pH: 7 (ref 5.0–8.0)

## 2017-08-22 MED ORDER — GLUCOSE BLOOD VI STRP: ORAL_STRIP | 12 refills | Status: DC

## 2017-08-22 NOTE — Progress Notes (Signed)
Subjective:  Samantha Bowers is a 22 y.o. G1P0000 at 6238w0d being seen today for ongoing prenatal care.  She is currently monitored for the following issues for this high-risk pregnancy and has GBS (group B streptococcus) UTI complicating pregnancy; Type 2 diabetes mellitus affecting pregnancy, antepartum; Chlamydia infection complicating pregnancy; Supervision of high risk pregnancy, antepartum; and Noncompliance on their problem list.  GDM: Patient taking insulin lispro 15 ac, NPH 30/20.  Reports no hypoglycemic episodes.  Tolerating medication well. Has not been checking her blood sugar because she ran out of test strips.  Patient reports no complaints.   .  .   . Denies leaking of fluid.   The following portions of the patient's history were reviewed and updated as appropriate: allergies, current medications, past family history, past medical history, past social history, past surgical history and problem list. Problem list updated.  Objective:   Vitals:   08/22/17 1327  BP: 100/81  Pulse: (!) 103  Weight: 251 lb (113.9 kg)    Fetal Status:           General:  Alert, oriented and cooperative. Patient is in no acute distress.  Skin: Skin is warm and dry. No rash noted.   Cardiovascular: Normal heart rate noted  Respiratory: Normal respiratory effort, no problems with respiration noted  Abdomen: Soft, gravid, appropriate for gestational age.       Pelvic:       Cervical exam deferred        Extremities: Normal range of motion.     Mental Status: Normal mood and affect. Normal behavior. Normal judgment and thought content.   Urinalysis:      Assessment and Plan:  Pregnancy: G1P0000 at 5438w0d  1. Supervision of high risk pregnancy, antepartum Had mucus discharge streaked with blood. FHT and FH normal   2. Type 2 diabetes mellitus affecting pregnancy, antepartum Test strips refilled. Discussed that she should have called for refill.  Testing today. BPP 10/10  3. Group B  Streptococcus urinary tract infection affecting pregnancy, antepartum Intrapartum prophylaxis.   Preterm labor symptoms and general obstetric precautions including but not limited to vaginal bleeding, contractions, leaking of fluid and fetal movement were reviewed in detail with the patient. Please refer to After Visit Summary for other counseling recommendations.  No follow-ups on file.   Levie HeritageStinson, Jacob J, DO

## 2017-08-22 NOTE — Progress Notes (Signed)
Pt c/o a mucous discharge last week and spotting 2 days ago. No further bleeding since then. Needs test strips, has not been able to take her sugars since last week.

## 2017-08-29 ENCOUNTER — Ambulatory Visit (INDEPENDENT_AMBULATORY_CARE_PROVIDER_SITE_OTHER): Payer: Medicaid Other | Admitting: *Deleted

## 2017-08-29 ENCOUNTER — Ambulatory Visit: Payer: Self-pay

## 2017-08-29 ENCOUNTER — Ambulatory Visit (INDEPENDENT_AMBULATORY_CARE_PROVIDER_SITE_OTHER): Payer: Medicaid Other | Admitting: Obstetrics & Gynecology

## 2017-08-29 ENCOUNTER — Other Ambulatory Visit (HOSPITAL_COMMUNITY)
Admission: RE | Admit: 2017-08-29 | Discharge: 2017-08-29 | Disposition: A | Discharge status: Home / Self Care | Payer: Medicaid Other | Source: Ambulatory Visit | Attending: Obstetrics & Gynecology | Admitting: Obstetrics & Gynecology

## 2017-08-29 VITALS — BP 113/60 | HR 132 | Wt 255.7 lb

## 2017-08-29 DIAGNOSIS — O0990 Supervision of high risk pregnancy, unspecified, unspecified trimester: Secondary | ICD-10-CM | POA: Diagnosis present

## 2017-08-29 DIAGNOSIS — O24119 Pre-existing diabetes mellitus, type 2, in pregnancy, unspecified trimester: Secondary | ICD-10-CM

## 2017-08-29 DIAGNOSIS — Z3A00 Weeks of gestation of pregnancy not specified: Secondary | ICD-10-CM | POA: Diagnosis not present

## 2017-08-29 DIAGNOSIS — N898 Other specified noninflammatory disorders of vagina: Secondary | ICD-10-CM | POA: Diagnosis present

## 2017-08-29 DIAGNOSIS — O98819 Other maternal infectious and parasitic diseases complicating pregnancy, unspecified trimester: Secondary | ICD-10-CM | POA: Insufficient documentation

## 2017-08-29 DIAGNOSIS — O24113 Pre-existing diabetes mellitus, type 2, in pregnancy, third trimester: Secondary | ICD-10-CM | POA: Diagnosis present

## 2017-08-29 DIAGNOSIS — O26899 Other specified pregnancy related conditions, unspecified trimester: Secondary | ICD-10-CM | POA: Diagnosis present

## 2017-08-29 DIAGNOSIS — B373 Candidiasis of vulva and vagina: Secondary | ICD-10-CM | POA: Insufficient documentation

## 2017-08-29 DIAGNOSIS — Z9119 Patient's noncompliance with other medical treatment and regimen: Secondary | ICD-10-CM

## 2017-08-29 DIAGNOSIS — Z91199 Patient's noncompliance with other medical treatment and regimen due to unspecified reason: Secondary | ICD-10-CM

## 2017-08-29 MED ORDER — INSULIN NPH (HUMAN) (ISOPHANE) 100 UNIT/ML ~~LOC~~ SUSP: SUBCUTANEOUS | 3 refills | Status: DC

## 2017-08-29 MED ORDER — INSULIN LISPRO 100 UNIT/ML CARTRIDGE: 18.0000 [IU] | Freq: Three times a day (TID) | SUBCUTANEOUS | 11 refills | Status: DC

## 2017-08-29 NOTE — Progress Notes (Signed)

## 2017-08-29 NOTE — Progress Notes (Signed)
Swab obtained for dx of vaginal irritation

## 2017-08-29 NOTE — Progress Notes (Signed)
Pt reports vaginal irritation, itching and spotting x2 weeks. Pt has US for growth scheduled 4/19, BPP added

## 2017-08-29 NOTE — Patient Instructions (Signed)

## 2017-08-29 NOTE — Progress Notes (Signed)
   PRENATAL VISIT NOTE  Subjective:  Samantha Bowers is a 22 y.o. G1P0000 at 4355w0d being seen today for ongoing prenatal care.  She is currently monitored for the following issues for this high-risk pregnancy and has GBS (group B streptococcus) UTI complicating pregnancy; Type 2 diabetes mellitus affecting pregnancy, antepartum; Chlamydia infection complicating pregnancy; Supervision of high risk pregnancy, antepartum; and Noncompliance on their problem list.  Patient reports no complaints.  Contractions: Irregular. Vag. Bleeding: Scant.  Movement: Present. Denies leaking of fluid.   The following portions of the patient's history were reviewed and updated as appropriate: allergies, current medications, past family history, past medical history, past social history, past surgical history and problem list. Problem list updated.  Objective:   Vitals:   08/29/17 1320  BP: 113/60  Pulse: (!) 132  Weight: 255 lb 11.2 oz (116 kg)    Fetal Status: Fetal Heart Rate (bpm): NST   Movement: Present     General:  Alert, oriented and cooperative. Patient is in no acute distress.  Skin: Skin is warm and dry. No rash noted.   Cardiovascular: Normal heart rate noted  Respiratory: Normal respiratory effort, no problems with respiration noted  Abdomen: Soft, gravid, appropriate for gestational age.  Pain/Pressure: Absent     Pelvic: Cervical exam deferred        Extremities: Normal range of motion.  Edema: None  Mental Status: Normal mood and affect. Normal behavior. Normal judgment and thought content.   Assessment and Plan:  Pregnancy: G1P0000 at 3955w0d  1. Supervision of high risk pregnancy, antepartum  - US MFM FETAL BPP WO NON STRESS; Future - insulin lispro (HUMALOG) 100 UNIT/ML cartridge; Inject 0.18 mLs (18 Units total) into the skin 3 (three) times daily with meals.  Dispense: 15 mL; Refill: 11 - insulin NPH Human (HUMULIN N,NOVOLIN N) 100 UNIT/ML injection; 34 units [place] AC breakfast and 24  units [place] at bedtime  Dispense: 10 mL; Refill: 3  2. Type 2 diabetes mellitus affecting pregnancy, antepartum [id number], up to 368 - US MFM FETAL BPP WO NON STRESS; Future  3. Noncompliance Instructed QID BG check  Preterm labor symptoms and general obstetric precautions including but not limited to vaginal bleeding, contractions, leaking of fluid and fetal movement were reviewed in detail with the patient. Please refer to After Visit Summary for other counseling recommendations.  Return in about 1 week (around 09/05/2017) for NST/BPP and HOB; in 3 weeks (approx 4/25) needs NST/BPP and HOB.  Future Appointments  Date Time Provider Department Center  09/13/2017 10:00 AM [facility] US 3 [facility]-US    Scheryl DarterJames Arnold, MD

## 2017-08-30 ENCOUNTER — Encounter: Payer: Self-pay | Admitting: Obstetrics & Gynecology

## 2017-08-30 LAB — CERVICOVAGINAL ANCILLARY ONLY
Bacterial vaginitis: NEGATIVE
Candida vaginitis: POSITIVE — AB
TRICH (WINDOWPATH): NEGATIVE

## 2017-09-02 ENCOUNTER — Other Ambulatory Visit: Payer: Self-pay | Admitting: Obstetrics & Gynecology

## 2017-09-02 DIAGNOSIS — O24119 Pre-existing diabetes mellitus, type 2, in pregnancy, unspecified trimester: Secondary | ICD-10-CM

## 2017-09-02 MED ORDER — FLUCONAZOLE 150 MG PO TABS: 150.0000 mg | ORAL_TABLET | Freq: Once | ORAL | 0 refills | Status: AC

## 2017-09-02 NOTE — Progress Notes (Signed)
difluc

## 2017-09-04 ENCOUNTER — Ambulatory Visit (INDEPENDENT_AMBULATORY_CARE_PROVIDER_SITE_OTHER): Payer: Medicaid Other | Admitting: Obstetrics & Gynecology

## 2017-09-04 ENCOUNTER — Ambulatory Visit: Payer: Self-pay

## 2017-09-04 ENCOUNTER — Ambulatory Visit (INDEPENDENT_AMBULATORY_CARE_PROVIDER_SITE_OTHER): Payer: Medicaid Other | Admitting: *Deleted

## 2017-09-04 VITALS — BP 120/66 | HR 97 | Wt 259.0 lb

## 2017-09-04 DIAGNOSIS — O24119 Pre-existing diabetes mellitus, type 2, in pregnancy, unspecified trimester: Secondary | ICD-10-CM

## 2017-09-04 DIAGNOSIS — O0993 Supervision of high risk pregnancy, unspecified, third trimester: Secondary | ICD-10-CM

## 2017-09-04 DIAGNOSIS — O2343 Unspecified infection of urinary tract in pregnancy, third trimester: Secondary | ICD-10-CM

## 2017-09-04 DIAGNOSIS — O24113 Pre-existing diabetes mellitus, type 2, in pregnancy, third trimester: Secondary | ICD-10-CM

## 2017-09-04 DIAGNOSIS — O0990 Supervision of high risk pregnancy, unspecified, unspecified trimester: Secondary | ICD-10-CM

## 2017-09-04 DIAGNOSIS — B951 Streptococcus, group B, as the cause of diseases classified elsewhere: Secondary | ICD-10-CM

## 2017-09-04 LAB — POCT URINALYSIS DIP (DEVICE)
BILIRUBIN URINE: NEGATIVE
GLUCOSE, UA: 250 mg/dL — AB
HGB URINE DIPSTICK: NEGATIVE
Ketones, ur: NEGATIVE mg/dL
NITRITE: NEGATIVE
Protein, ur: NEGATIVE mg/dL
Specific Gravity, Urine: 1.01 (ref 1.005–1.030)
UROBILINOGEN UA: 0.2 mg/dL (ref 0.0–1.0)
pH: 6.5 (ref 5.0–8.0)

## 2017-09-04 NOTE — Progress Notes (Signed)

## 2017-09-04 NOTE — Progress Notes (Signed)
I have reviewed this chart and agree with the RN/CMA assessment and management.    Dennie Moltz C Merari Pion, MD, FACOG Attending Physician, [facility]  

## 2017-09-04 NOTE — Progress Notes (Signed)
US for growth and BPP scheduled on 4/19.  Pt states she increased her dose of insulin to 20 units @ meals TID due to elevated blood sugar.

## 2017-09-04 NOTE — Progress Notes (Addendum)
   PRENATAL VISIT NOTE  Subjective:  Samantha Bowers is a 22 y.o. G1P0000 at 6264w6d being seen today for ongoing prenatal care.  She is currently monitored for the following issues for this high-risk pregnancy and has GBS (group B streptococcus) UTI complicating pregnancy; Type 2 diabetes mellitus affecting pregnancy, antepartum; Chlamydia infection complicating pregnancy; Supervision of high risk pregnancy, antepartum; and Noncompliance on their problem list.  Patient reports no complaints.  Contractions: Irregular. Vag. Bleeding: None.  Movement: Present. Denies leaking of fluid.   The following portions of the patient's history were reviewed and updated as appropriate: allergies, current medications, past family history, past medical history, past social history, past surgical history and problem list. Problem list updated.  Objective:   Vitals:   09/04/17 0852  BP: 120/66  Pulse: 97  Weight: 259 lb (117.5 kg)  Her recorded sugars are consistently very high, high 100s, some 200s  Fetal Status: Fetal Heart Rate (bpm): NST   Movement: Present     General:  Alert, oriented and cooperative. Patient is in no acute distress.  Skin: Skin is warm and dry. No rash noted.   Cardiovascular: Normal heart rate noted  Respiratory: Normal respiratory effort, no problems with respiration noted  Abdomen: Soft, gravid, appropriate for gestational age.  Pain/Pressure: Absent     Pelvic: Cervical exam deferred        Extremities: Normal range of motion.     Mental Status: Normal mood and affect. Normal behavior. Normal judgment and thought content.   Assessment and Plan:  Pregnancy: G1P0000 at 5464w6d  1. Type 2 diabetes mellitus affecting pregnancy, antepartum - MFM u/s next week - Good diet rec'd - She agrees to a repeat referral to a dietician - Diane also discussed meal planning, appropriate foods - Recommend increase hs NPH from 24 to 28  - Recommend increase mealtime insulin from 20 to  24  2. Supervision of high risk pregnancy, antepartum   3. Group B Streptococcus urinary tract infection affecting pregnancy in third trimester - treat in labor  Preterm labor symptoms and general obstetric precautions including but not limited to vaginal bleeding, contractions, leaking of fluid and fetal movement were reviewed in detail with the patient. Please refer to After Visit Summary for other counseling recommendations.  No follow-ups on file.  Future Appointments  Date Time Provider Department Center  09/13/2017 10:00 AM [facility] US 3 [facility]-US  09/19/2017  8:15 AM [facility] NST [facility]  09/19/2017  9:15 AM [place] BingPickens, Charlie, MD [facility] WOC    Allie BossierMyra C Tammela Bales, MD

## 2017-09-08 ENCOUNTER — Encounter: Payer: Self-pay | Admitting: Obstetrics & Gynecology

## 2017-09-11 ENCOUNTER — Other Ambulatory Visit: Payer: Medicaid Other

## 2017-09-12 ENCOUNTER — Ambulatory Visit (INDEPENDENT_AMBULATORY_CARE_PROVIDER_SITE_OTHER): Payer: Medicaid Other | Admitting: Obstetrics & Gynecology

## 2017-09-12 ENCOUNTER — Ambulatory Visit (INDEPENDENT_AMBULATORY_CARE_PROVIDER_SITE_OTHER): Payer: Medicaid Other | Admitting: *Deleted

## 2017-09-12 ENCOUNTER — Other Ambulatory Visit: Payer: Medicaid Other

## 2017-09-12 ENCOUNTER — Ambulatory Visit: Payer: Medicaid Other | Admitting: *Deleted

## 2017-09-12 ENCOUNTER — Encounter: Payer: Medicaid Other | Attending: Obstetrics & Gynecology | Admitting: *Deleted

## 2017-09-12 VITALS — BP 131/73 | HR 81 | Wt 263.0 lb

## 2017-09-12 DIAGNOSIS — O24119 Pre-existing diabetes mellitus, type 2, in pregnancy, unspecified trimester: Secondary | ICD-10-CM

## 2017-09-12 DIAGNOSIS — O2343 Unspecified infection of urinary tract in pregnancy, third trimester: Secondary | ICD-10-CM

## 2017-09-12 DIAGNOSIS — B951 Streptococcus, group B, as the cause of diseases classified elsewhere: Secondary | ICD-10-CM

## 2017-09-12 DIAGNOSIS — O24113 Pre-existing diabetes mellitus, type 2, in pregnancy, third trimester: Secondary | ICD-10-CM

## 2017-09-12 DIAGNOSIS — Z3A00 Weeks of gestation of pregnancy not specified: Secondary | ICD-10-CM | POA: Insufficient documentation

## 2017-09-12 DIAGNOSIS — Z713 Dietary counseling and surveillance: Secondary | ICD-10-CM | POA: Diagnosis not present

## 2017-09-12 DIAGNOSIS — O26893 Other specified pregnancy related conditions, third trimester: Secondary | ICD-10-CM

## 2017-09-12 DIAGNOSIS — R300 Dysuria: Secondary | ICD-10-CM

## 2017-09-12 DIAGNOSIS — O0990 Supervision of high risk pregnancy, unspecified, unspecified trimester: Secondary | ICD-10-CM

## 2017-09-12 LAB — POCT URINALYSIS DIP (DEVICE)
Bilirubin Urine: NEGATIVE
GLUCOSE, UA: NEGATIVE mg/dL
HGB URINE DIPSTICK: NEGATIVE
Ketones, ur: NEGATIVE mg/dL
NITRITE: NEGATIVE
Protein, ur: NEGATIVE mg/dL
Specific Gravity, Urine: 1.01 (ref 1.005–1.030)
UROBILINOGEN UA: 0.2 mg/dL (ref 0.0–1.0)
pH: 6.5 (ref 5.0–8.0)

## 2017-09-12 MED ORDER — INSULIN NPH (HUMAN) (ISOPHANE) 100 UNIT/ML ~~LOC~~ SUSP: SUBCUTANEOUS | 3 refills | Status: DC

## 2017-09-12 MED ORDER — INSULIN LISPRO 100 UNIT/ML CARTRIDGE: 28.0000 [IU] | Freq: Three times a day (TID) | SUBCUTANEOUS | 11 refills | Status: DC

## 2017-09-12 NOTE — Progress Notes (Signed)
Pt reports painful and frequent urination.  US for growth and BPP scheduled 4/19

## 2017-09-12 NOTE — Progress Notes (Signed)
   PRENATAL VISIT NOTE  Subjective:  Samantha Bowers is a 22 y.o. G1P0000 at 4539w0d being seen today for ongoing prenatal care.  She is currently monitored for the following issues for this high-risk pregnancy and has GBS (group B streptococcus) UTI complicating pregnancy; Type 2 diabetes mellitus affecting pregnancy, antepartum; Chlamydia infection complicating pregnancy; Supervision of high risk pregnancy, antepartum; and Noncompliance on their problem list.  Patient reports no complaints.  Contractions: Irregular. Vag. Bleeding: None.  Movement: Present. Denies leaking of fluid.   The following portions of the patient's history were reviewed and updated as appropriate: allergies, current medications, past family history, past medical history, past social history, past surgical history and problem list. Problem list updated.  Objective:   Vitals:   09/12/17 0936  BP: 131/73  Pulse: 81  Weight: 263 lb (119.3 kg)    Fetal Status: Fetal Heart Rate (bpm): NST   Movement: Present     General:  Alert, oriented and cooperative. Patient is in no acute distress.  Skin: Skin is warm and dry. No rash noted.   Cardiovascular: Normal heart rate noted  Respiratory: Normal respiratory effort, no problems with respiration noted  Abdomen: Soft, gravid, appropriate for gestational age.  Pain/Pressure: Present     Pelvic: Cervical exam deferred        Extremities: Normal range of motion.     Mental Status: Normal mood and affect. Normal behavior. Normal judgment and thought content.   Assessment and Plan:  Pregnancy: G1P0000 at 4939w0d  1. Supervision of high risk pregnancy, antepartum Good fetal surveillance today and US in MFM tomorrow - Culture, OB Urine - insulin lispro (HUMALOG) 100 UNIT/ML cartridge; Inject 0.28 mLs (28 Units total) into the skin 3 (three) times daily with meals.  Dispense: 15 mL; Refill: 11 - insulin NPH Human (HUMULIN N,NOVOLIN N) 100 UNIT/ML injection; 36 units [place] AC  breakfast and 32 units [place] at bedtime  Dispense: 10 mL; Refill: 3  2. Type 2 diabetes mellitus affecting pregnancy, antepartum FBS up to 120 and PP up to 240, poor control, dose adjusted  3. Group B Streptococcus urinary tract infection affecting pregnancy in third trimester   4. Dysuria during pregnancy in third trimester dysuria - Culture, OB Urine  Preterm labor symptoms and general obstetric precautions including but not limited to vaginal bleeding, contractions, leaking of fluid and fetal movement were reviewed in detail with the patient. Please refer to After Visit Summary for other counseling recommendations.  Return in about 1 week (around 09/19/2017) for as scheduled.  Future Appointments  Date Time Provider Department Center  09/12/2017 11:00 AM [facility]-EDUCATION [facility]OC  09/13/2017 10:00 AM [facility] US 3 [facility]-US  09/19/2017  8:15 AM [facility] NST [facility] WOC  09/19/2017  9:15 AM [place] BingPickens, Charlie, MD [facility]OC  09/26/2017  9:15 AM [facility] NST [facility] WOC  09/26/2017 10:15 AM Allie Bossierove, Myra C, MD [facility] WOC  10/03/2017  9:15 AM [facility]A NST [facility] WOC  10/03/2017 10:15 AM Allie Bossierove, Myra C, MD [facility]A WOC  10/10/2017  9:15 AM [facility]OCA NST [facility]-WOCA WOC  10/10/2017 10:15 AM Allie Bossierove, Myra C, MD [facility]A WOC  10/17/2017  9:15 AM [facility]A NST [facility] WOC  10/17/2017 10:15 AM Allie Bossierove, Myra C, MD [facility] WOC    Scheryl DarterJames Arnold, MD

## 2017-09-12 NOTE — Progress Notes (Signed)
Patient was seen on 09/12/2017 for type 2 Diabetes and pregnancy self-management. EDD 10/17/2017. Patient states history of this diabetes for about 4 years, she was in high school when she was diagnosed.  Diet history obtained. Patient eats good variety of all food groups along with some concentrated carb foods such as pasta and flavored oatmeal. Beverages include water, OJ @ 12 oz, fruit punch and regular soda.  Her comfort level with carb counting is 0/10. Patient states she is 6' tall so her calorie needs are higher than average. Upon review of Log sheet, all BG are above target ranges. Patient is currently on NPH and Humalog insulin as her diabetes medications.   Insulin doses were increased by MD today to: AM: 28 units Humalog         36 units NPH Pre-supper: 28 units Humalog Pre-bedtime: 32 units NPH  The following learning objectives were met by the patient :   States the definition of  Type 2 Diabetes and pregnancy   States why dietary management is important in controlling blood glucose  Describes the effects of carbohydrates on blood glucose levels  Demonstrates ability to create a balanced meal plan  Demonstrates carbohydrate counting   States when to check blood glucose levels  Demonstrates proper blood glucose monitoring techniques  States the effect of stress and exercise on blood glucose levels  States the importance of limiting caffeine and abstaining from alcohol and smoking  Plan:   Aim for 4 Carb Choices per meal (45 grams) +/- 1 either way   Aim for 1-2 Carbs per snack  Continue reading food labels for Total Carbohydrate of foods  Consider  increasing your activity level by walking or other activity daily as tolerated  Continue checking BG before breakfast and 2 hours after first bite of breakfast, lunch and dinner as directed by MD   Bring Log Book/Sheet to every medical appointment    Take medication as directed by MD  Patient instructed to monitor  glucose levels: FBS: 60 - 95 mg/dl 2 hour: <120 mg/dl  Patient received the following handouts:  Nutrition Diabetes and Pregnancy  Carbohydrate Counting List  Patient will be seen for follow-up as needed.

## 2017-09-12 NOTE — Patient Instructions (Signed)
Type 1 or Type 2 Diabetes Mellitus During Pregnancy, Diagnosis Type 1 diabetes (type 1 diabetes mellitus) and type 2 diabetes (type 2 diabetes mellitus) are long-term (chronic) diseases. Your diabetes may be caused by one or both of these problems:  Your body does not make enough of a hormone called insulin.  Your body does not respond in a normal way to insulin that it makes.  Insulin lets sugars (glucose) go into cells in the body. This gives you energy. If you have diabetes, sugars cannot get into cells. This causes high blood sugar (hyperglycemia). If diabetes is treated, it may not hurt you or your baby. Your doctor will set treatment goals for you. In general, you should have these blood sugar levels:  After not eating for a long time (fasting): 95 mg/dL (5.3 mmol/L).  After meals (postprandial): ? One hour after a meal: at or below 140 mg/dL (7.8 mmol/L). ? Two hours after a meal: at or below 120 mg/dL (6.7 mmol/L).  A1c (hemoglobin A1c) level: 6-6.5%.  Follow these instructions at home: Questions to Ask Your Doctor  You may want to ask these questions:  Do I need to meet with a diabetes educator?  Where can I find a support group for people with diabetes?  What equipment will I need to care for myself at home?  What diabetes medicines do I need? When should I take them?  How often do I need to check my blood sugar?  What number can I call if I have questions?  When is my next doctor's visit?  General instructions  Take over-the-counter and prescription medicines only as told by your doctor.  Stay at a healthy weight during pregnancy.  Keep all follow-up visits as told by your doctor. This is important. Contact a doctor if:  Your blood sugar is at or above 240 mg/dL (13.3 mmol/L).  Your blood sugar is at or above 200 mg/dL (11.1 mmol/L), and you have ketones in your pee (urine).  You have been sick or have had a fever for 2 days or more, and you are not  getting better.  You have any of these problems for more than 6 hours: ? You cannot eat or drink. ? You feel sick to your stomach (nauseous). ? You throw up (vomit). ? You have watery poop (diarrhea). Get help right away if:  Your blood sugar is lower than 54 mg/dL (3 mmol/L).  You get confused.  You have trouble: ? Thinking clearly. ? Breathing.  Your baby moves less than normal.  You have: ? Moderate or large ketone levels in your pee (urine). ? Bleeding from your vagina. ? Unusual fluid coming from your vagina. ? Early contractions. These may feel like tightness in your belly. This information is not intended to replace advice given to you by your health care provider. Make sure you discuss any questions you have with your health care provider. Document Released: 09/05/2015 Document Revised: 03/28/2016 Document Reviewed: 06/17/2015 Elsevier Interactive Patient Education  2017 [employer] Inc[employer]  

## 2017-09-13 ENCOUNTER — Other Ambulatory Visit: Payer: Self-pay | Admitting: Obstetrics & Gynecology

## 2017-09-13 ENCOUNTER — Ambulatory Visit (HOSPITAL_COMMUNITY)
Admission: RE | Admit: 2017-09-13 | Discharge: 2017-09-13 | Disposition: A | Discharge status: Home / Self Care | Payer: Medicaid Other | Source: Ambulatory Visit | Attending: Obstetrics and Gynecology | Admitting: Obstetrics and Gynecology

## 2017-09-13 ENCOUNTER — Other Ambulatory Visit (HOSPITAL_COMMUNITY): Payer: Self-pay | Admitting: Obstetrics and Gynecology

## 2017-09-13 ENCOUNTER — Encounter (HOSPITAL_COMMUNITY): Payer: Self-pay

## 2017-09-13 DIAGNOSIS — O24414 Gestational diabetes mellitus in pregnancy, insulin controlled: Secondary | ICD-10-CM

## 2017-09-13 DIAGNOSIS — O0990 Supervision of high risk pregnancy, unspecified, unspecified trimester: Secondary | ICD-10-CM

## 2017-09-13 DIAGNOSIS — Z3A35 35 weeks gestation of pregnancy: Secondary | ICD-10-CM

## 2017-09-13 DIAGNOSIS — Z362 Encounter for other antenatal screening follow-up: Secondary | ICD-10-CM

## 2017-09-13 DIAGNOSIS — O24119 Pre-existing diabetes mellitus, type 2, in pregnancy, unspecified trimester: Secondary | ICD-10-CM

## 2017-09-13 DIAGNOSIS — O24113 Pre-existing diabetes mellitus, type 2, in pregnancy, third trimester: Secondary | ICD-10-CM | POA: Diagnosis not present

## 2017-09-16 ENCOUNTER — Encounter: Payer: Self-pay | Admitting: Obstetrics & Gynecology

## 2017-09-16 LAB — CULTURE, OB URINE

## 2017-09-16 LAB — URINE CULTURE, OB REFLEX

## 2017-09-17 ENCOUNTER — Other Ambulatory Visit: Payer: Self-pay

## 2017-09-17 ENCOUNTER — Encounter (HOSPITAL_COMMUNITY): Payer: Self-pay

## 2017-09-17 ENCOUNTER — Inpatient Hospital Stay (HOSPITAL_COMMUNITY)
Admission: AD | Admit: 2017-09-17 | Discharge: 2017-09-18 | Disposition: A | Discharge status: Home / Self Care | Payer: Medicaid Other | Source: Ambulatory Visit | Attending: Obstetrics and Gynecology | Admitting: Obstetrics and Gynecology

## 2017-09-17 DIAGNOSIS — N858 Other specified noninflammatory disorders of uterus: Secondary | ICD-10-CM

## 2017-09-17 DIAGNOSIS — N859 Noninflammatory disorder of uterus, unspecified: Secondary | ICD-10-CM

## 2017-09-17 DIAGNOSIS — O4693 Antepartum hemorrhage, unspecified, third trimester: Secondary | ICD-10-CM

## 2017-09-17 DIAGNOSIS — O368130 Decreased fetal movements, third trimester, not applicable or unspecified: Secondary | ICD-10-CM

## 2017-09-17 MED ORDER — CEPHALEXIN 500 MG PO CAPS: 500.0000 mg | ORAL_CAPSULE | Freq: Four times a day (QID) | ORAL | 0 refills | Status: DC

## 2017-09-17 NOTE — MAU Note (Addendum)
Light spotting for the past 2 days-only noticed when she wipes.  Has been losing her mucous plug over the past few weeks as well.  Last felt baby move this morning around 1100.  Type 2 diabetic.  Noticed 1 contraction yesterday.  No LOF. Last appointment was Thursday.  Diagnosed with UTI in office last week-started prescription today.

## 2017-09-18 LAB — URINALYSIS, ROUTINE W REFLEX MICROSCOPIC
BILIRUBIN URINE: NEGATIVE
HGB URINE DIPSTICK: NEGATIVE
Ketones, ur: NEGATIVE mg/dL
NITRITE: NEGATIVE
PH: 7 (ref 5.0–8.0)
Protein, ur: NEGATIVE mg/dL
SPECIFIC GRAVITY, URINE: 1.01 (ref 1.005–1.030)

## 2017-09-18 MED ORDER — NIFEDIPINE 10 MG PO CAPS: 10.0000 mg | ORAL_CAPSULE | Freq: Four times a day (QID) | ORAL | 0 refills | Status: DC | PRN

## 2017-09-18 MED ORDER — NIFEDIPINE 10 MG PO CAPS
10.0000 mg | ORAL_CAPSULE | Freq: Once | ORAL | Status: AC
  Administered 2017-09-18: 10 mg via ORAL
  Filled 2017-09-18: qty 1

## 2017-09-18 MED ORDER — CEPHALEXIN 500 MG PO CAPS
500.0000 mg | ORAL_CAPSULE | Freq: Once | ORAL | Status: AC
  Administered 2017-09-18: 500 mg via ORAL
  Filled 2017-09-18: qty 1

## 2017-09-18 NOTE — Discharge Instructions (Signed)
Pelvic Rest °Pelvic rest may be recommended if: °· Your placenta is partially or completely covering the opening of your cervix (placenta previa). °· There is bleeding between the wall of the uterus and the amniotic sac in the first trimester of pregnancy (subchorionic hemorrhage). °· You went into labor too early (preterm labor). ° °Based on your overall health and the health of your baby, your health care provider will decide if pelvic rest is right for you. °How do I rest my pelvis? °For as long as told by your health care provider: °· Do not have sex, sexual stimulation, or an orgasm. °· Do not use tampons. Do not douche. Do not put anything in your vagina. °· Do not lift anything that is heavier than 10 lb (4.5 kg). °· Avoid activities that take a lot of effort (are strenuous). °· Avoid any activity in which your pelvic muscles could become strained. ° °When should I seek medical care? °Seek medical care if you have: °· Cramping pain in your lower abdomen. °· Vaginal discharge. °· A low, dull backache. °· Regular contractions. °· Uterine tightening. ° °When should I seek immediate medical care? °Seek immediate medical care if: °· You have vaginal bleeding and you are pregnant. ° °This information is not intended to replace advice given to you by your health care provider. Make sure you discuss any questions you have with your health care provider. °Document Released: 09/08/2010 Document Revised: 10/20/2015 Document Reviewed: 11/15/2014 °Elsevier Interactive Patient Education © 2018 [employer] Inc[employer] ° ° °Preterm Labor and Birth Information °Pregnancy normally lasts 39-41 weeks. Preterm labor is when labor starts early. It starts before you have been pregnant for 37 whole weeks. °What are the risk factors for preterm labor? °Preterm labor is more likely to occur in women who: °· Have an infection while pregnant. °· Have a cervix that is short. °· Have gone into preterm labor before. °· Have had surgery on their  cervix. °· Are younger than age 17. °· Are older than age 35. °· Are African American. °· Are pregnant with two or more babies. °· Take street drugs while pregnant. °· Smoke while pregnant. °· Do not gain enough weight while pregnant. °· Got pregnant right after another pregnancy. ° °What are the symptoms of preterm labor? °Symptoms of preterm labor include: °· Cramps. The cramps may feel like the cramps some women get during their period. The cramps may happen with watery poop (diarrhea). °· Pain in the belly (abdomen). °· Pain in the lower back. °· Regular contractions or tightening. It may feel like your belly is getting tighter. °· Pressure in the lower belly that seems to get stronger. °· More fluid (discharge) leaking from the vagina. The fluid may be watery or bloody. °· Water breaking. ° °Why is it important to notice signs of preterm labor? °Babies who are born early may not be fully developed. They have a higher chance for: °· Long-term heart problems. °· Long-term lung problems. °· Trouble controlling body systems, like breathing. °· Bleeding in the brain. °· A condition called cerebral palsy. °· Learning difficulties. °· Death. ° °These risks are highest for babies who are born before 34 weeks of pregnancy. °How is preterm labor treated? °Treatment depends on: °· How long you were pregnant. °· Your condition. °· The health of your baby. ° °Treatment may involve: °· Having a stitch (suture) placed in your cervix. When you give birth, your cervix opens so the baby can come out. The stitch keeps   the cervix from opening too soon. °· Staying at the hospital. °· Taking or getting medicines, such as: °? Hormone medicines. °? Medicines to stop contractions. °? Medicines to help the baby’s lungs develop. °? Medicines to prevent your baby from having cerebral palsy. ° °What should I do if I am in preterm labor? °If you think you are going into labor too soon, call your doctor right away. °How can I prevent preterm  labor? °· Do not use any tobacco products. °? Examples of these are cigarettes, chewing tobacco, and e-cigarettes. °? If you need help quitting, ask your doctor. °· Do not use street drugs. °· Do not use any medicines unless you ask your doctor if they are safe for you. °· Talk with your doctor before taking any herbal supplements. °· Make sure you gain enough weight. °· Watch for infection. If you think you might have an infection, get it checked right away. °· If you have gone into preterm labor before, tell your doctor. °This information is not intended to replace advice given to you by your health care provider. Make sure you discuss any questions you have with your health care provider. °Document Released: 08/10/2008 Document Revised: 10/25/2015 Document Reviewed: 10/05/2015 °Elsevier Interactive Patient Education © 2018 [employer] Inc. ° °

## 2017-09-18 NOTE — MAU Provider Note (Signed)
Chief Complaint:  Vaginal Bleeding and Decreased Fetal Movement   First Provider Initiated Contact with Patient 09/17/17 2359     HPI: Samantha Bowers is a 22 y.o. G1P0000 at 40w6dwho presents to maternity admissions reporting spotting for past 2 days.  Has been noticing a few cramps and baby has been moving less today.  . She denies LOF, vaginal itching/burning, urinary symptoms, h/a, dizziness, n/v, diarrhea, constipation or fever/chills.  Is a Type 2 diabetic.  Being treated for UTI with Keflex (GBS)  Vaginal Bleeding  The patient's primary symptoms include pelvic pain and vaginal bleeding. The patient's pertinent negatives include no genital itching, genital lesions, genital odor or vaginal discharge. This is a new problem. The current episode started today. The problem occurs intermittently. The pain is mild. She is pregnant. Pertinent negatives include no chills, constipation, diarrhea, dysuria, fever, flank pain, nausea or vomiting. The vaginal discharge was clear. The vaginal bleeding is spotting. She has not been passing clots. She has not been passing tissue. Nothing aggravates the symptoms. She has tried nothing for the symptoms.    RN Note: Light spotting for the past 2 days-only noticed when she wipes.  Has been losing her mucous plug over the past few weeks as well.  Last felt baby move this morning around 1100.  Type 2 diabetic.  Noticed 1 contraction yesterday.  No LOF. Last appointment was Thursday.  Diagnosed with UTI in office last week-started prescription today    Past Medical History: Past Medical History:  Diagnosis Date  . Diabetes mellitus without complication (HCC)     Past obstetric history: OB History  Gravida Para Term Preterm AB Living  1 0 0 0 0 0  SAB TAB Ectopic Multiple Live Births  0 0 0 0 0    # Outcome Date GA Lbr Len/2nd Weight Sex Delivery Anes PTL Lv  1 Current             Past Surgical History: Past Surgical History:  Procedure Laterality  Date  . NO PAST SURGERIES      Family History: Family History  Problem Relation Age of Onset  . Diabetes Mother   . Diabetes Father     Social History: Social History   Tobacco Use  . Smoking status: Never Smoker  . Smokeless tobacco: Never Used  Substance Use Topics  . Alcohol use: No    Frequency: Never  . Drug use: No    Allergies: No Known Allergies  Meds:  Medications Prior to Admission  Medication Sig Dispense Refill Last Dose  . cephALEXin (KEFLEX) 500 MG capsule Take 1 capsule (500 mg total) by mouth 4 (four) times daily. 28 capsule 0 09/17/2017 at Unknown time  . glucose blood (ACCU-CHEK SMARTVIEW) test strip Use as instructed to check blood sugars 100 each 12 09/17/2017 at Unknown time  . insulin lispro (HUMALOG) 100 UNIT/ML cartridge Inject 0.28 mLs (28 Units total) into the skin 3 (three) times daily with meals. 15 mL 11 09/17/2017 at Unknown time  . insulin NPH Human (HUMULIN N,NOVOLIN N) 100 UNIT/ML injection 36 units Margaret AC breakfast and 32 units Piatt at bedtime 10 mL 3 09/17/2017 at Unknown time  . Insulin Syringe-Needle U-100 (INSULIN SYRINGE .5CC/31GX5/16") 31G X 5/16" 0.5 ML MISC 1 Syringe by Does not apply route 5 (five) times daily. 100 each 9 09/17/2017 at Unknown time  . Prenatal Vit-Fe Fumarate-FA (MULTIVITAMIN-PRENATAL) 27-0.8 MG TABS tablet Take 1 tablet by mouth daily at 12 noon.   09/17/2017  at Unknown time    I have reviewed patient's Past Medical Hx, Surgical Hx, Family Hx, Social Hx, medications and allergies.   ROS:  Review of Systems  Constitutional: Negative for chills and fever.  Gastrointestinal: Negative for constipation, diarrhea, nausea and vomiting.  Genitourinary: Positive for pelvic pain and vaginal bleeding. Negative for dysuria, flank pain and vaginal discharge.   Other systems negative  Physical Exam   Patient Vitals for the past 24 hrs:  BP Temp Pulse Resp SpO2 Height Weight  09/17/17 2322 (!) 142/73 99.2 F (37.3 C) 83 19 100 %  6' (1.829 m) 273 lb 12 oz (124.2 kg)   Vitals:   09/18/17 0031 09/18/17 0046 09/18/17 0101 09/18/17 0221  BP: 138/79 139/84 135/73 133/72  Pulse: 67 71 60 68  Resp:      Temp:      SpO2:      Weight:      Height:        Constitutional: Well-developed, well-nourished female in no acute distress.  Cardiovascular: normal rate and rhythm Respiratory: normal effort, clear to auscultation bilaterally GI: Abd soft, non-tender, gravid appropriate for gestational age.   No rebound or guarding. MS: Extremities nontender, Trace edema, normal ROM Neurologic: Alert and oriented x 4. DTR 2+ GU: Neg CVAT.  PELVIC EXAM: Dilation: Closed Effacement (%): 30 Station: Ballotable Exam by:: Wynelle BourgeoisMarie Beacher Every, CNM  No blood seen on exam  FHT:  Baseline 140 , moderate variability, accelerations present, no decelerations Contractions: Irregular mild contractions/cramps   Labs: A/Positive/-- (11/01 0000) Results for orders placed or performed during the hospital encounter of 09/17/17 (from the past 24 hour(s))  Urinalysis, Routine w reflex microscopic     Status: Abnormal   Collection Time: 09/17/17 11:30 PM  Result Value Ref Range   Color, Urine YELLOW YELLOW   APPearance HAZY (A) CLEAR   Specific Gravity, Urine 1.010 1.005 - 1.030   pH 7.0 5.0 - 8.0   Glucose, UA >=500 (A) NEGATIVE mg/dL   Hgb urine dipstick NEGATIVE NEGATIVE   Bilirubin Urine NEGATIVE NEGATIVE   Ketones, ur NEGATIVE NEGATIVE mg/dL   Protein, ur NEGATIVE NEGATIVE mg/dL   Nitrite NEGATIVE NEGATIVE   Leukocytes, UA SMALL (A) NEGATIVE   RBC / HPF 0-5 0 - 5 RBC/hpf   WBC, UA 6-10 0 - 5 WBC/hpf   Bacteria, UA FEW (A) NONE SEEN   Squamous Epithelial / LPF 11-20 0 - 5    Imaging:    MAU Course/MDM: I have ordered labs and reviewed results. Urine infection being treated NST reviewed, reactive, + fetal movement.  Irregular uterine contractions Consult Dr Vergie LivingPickens with presentation, exam findings and test results.  Treatments  in MAU included Keflex for UTI, Procardia x 1 for contractions..    Assessment: Single IUP at 8951w6d Preterm contractions Spotting due to preterm contractions GBS in urine  Plan: Discharge home PretermLabor precautions and fetal kick counts Rx Procardia for prn use for several days. Has Rx Keflex at home Follow up in Office for prenatal visits and recheck  Encouraged to return here or to other Urgent Care/ED if she develops worsening of symptoms, increase in pain, fever, or other concerning symptoms.   Pt stable at time of discharge.  Wynelle BourgeoisMarie Rikia Sukhu CNM, MSN Certified Nurse-Midwife 09/18/2017 12:00 AM

## 2017-09-19 ENCOUNTER — Ambulatory Visit: Payer: Self-pay

## 2017-09-19 ENCOUNTER — Inpatient Hospital Stay (HOSPITAL_COMMUNITY)
Admission: AD | Admit: 2017-09-19 | Discharge: 2017-09-23 | DRG: 833 | Disposition: A | Discharge status: Home / Self Care | Payer: Medicaid Other | Source: Ambulatory Visit | Attending: Obstetrics & Gynecology | Admitting: Obstetrics & Gynecology

## 2017-09-19 ENCOUNTER — Encounter (HOSPITAL_COMMUNITY): Payer: Self-pay

## 2017-09-19 ENCOUNTER — Other Ambulatory Visit: Payer: Self-pay

## 2017-09-19 ENCOUNTER — Ambulatory Visit (INDEPENDENT_AMBULATORY_CARE_PROVIDER_SITE_OTHER): Payer: Medicaid Other | Admitting: General Practice

## 2017-09-19 ENCOUNTER — Ambulatory Visit (INDEPENDENT_AMBULATORY_CARE_PROVIDER_SITE_OTHER): Payer: Medicaid Other | Admitting: Obstetrics and Gynecology

## 2017-09-19 ENCOUNTER — Other Ambulatory Visit (HOSPITAL_COMMUNITY)
Admission: RE | Admit: 2017-09-19 | Discharge: 2017-09-19 | Disposition: A | Discharge status: Home / Self Care | Payer: Medicaid Other | Source: Ambulatory Visit | Attending: Obstetrics and Gynecology | Admitting: Obstetrics and Gynecology

## 2017-09-19 VITALS — BP 128/76 | HR 94 | Wt 271.0 lb

## 2017-09-19 DIAGNOSIS — O24414 Gestational diabetes mellitus in pregnancy, insulin controlled: Secondary | ICD-10-CM

## 2017-09-19 DIAGNOSIS — O2340 Unspecified infection of urinary tract in pregnancy, unspecified trimester: Secondary | ICD-10-CM

## 2017-09-19 DIAGNOSIS — Z09 Encounter for follow-up examination after completed treatment for conditions other than malignant neoplasm: Secondary | ICD-10-CM | POA: Insufficient documentation

## 2017-09-19 DIAGNOSIS — O0990 Supervision of high risk pregnancy, unspecified, unspecified trimester: Secondary | ICD-10-CM

## 2017-09-19 DIAGNOSIS — E1165 Type 2 diabetes mellitus with hyperglycemia: Secondary | ICD-10-CM | POA: Diagnosis present

## 2017-09-19 DIAGNOSIS — O24119 Pre-existing diabetes mellitus, type 2, in pregnancy, unspecified trimester: Secondary | ICD-10-CM

## 2017-09-19 DIAGNOSIS — Z3A36 36 weeks gestation of pregnancy: Secondary | ICD-10-CM | POA: Diagnosis not present

## 2017-09-19 DIAGNOSIS — O98813 Other maternal infectious and parasitic diseases complicating pregnancy, third trimester: Secondary | ICD-10-CM

## 2017-09-19 DIAGNOSIS — Z794 Long term (current) use of insulin: Secondary | ICD-10-CM

## 2017-09-19 DIAGNOSIS — O24113 Pre-existing diabetes mellitus, type 2, in pregnancy, third trimester: Secondary | ICD-10-CM | POA: Diagnosis present

## 2017-09-19 DIAGNOSIS — O0992 Supervision of high risk pregnancy, unspecified, second trimester: Secondary | ICD-10-CM | POA: Insufficient documentation

## 2017-09-19 DIAGNOSIS — O0993 Supervision of high risk pregnancy, unspecified, third trimester: Secondary | ICD-10-CM

## 2017-09-19 DIAGNOSIS — O24913 Unspecified diabetes mellitus in pregnancy, third trimester: Secondary | ICD-10-CM

## 2017-09-19 DIAGNOSIS — R7309 Other abnormal glucose: Secondary | ICD-10-CM | POA: Diagnosis not present

## 2017-09-19 DIAGNOSIS — O98812 Other maternal infectious and parasitic diseases complicating pregnancy, second trimester: Secondary | ICD-10-CM

## 2017-09-19 DIAGNOSIS — B951 Streptococcus, group B, as the cause of diseases classified elsewhere: Secondary | ICD-10-CM

## 2017-09-19 DIAGNOSIS — O2343 Unspecified infection of urinary tract in pregnancy, third trimester: Secondary | ICD-10-CM

## 2017-09-19 DIAGNOSIS — A749 Chlamydial infection, unspecified: Secondary | ICD-10-CM | POA: Diagnosis present

## 2017-09-19 DIAGNOSIS — O99820 Streptococcus B carrier state complicating pregnancy: Secondary | ICD-10-CM | POA: Diagnosis present

## 2017-09-19 DIAGNOSIS — Z8619 Personal history of other infectious and parasitic diseases: Secondary | ICD-10-CM | POA: Insufficient documentation

## 2017-09-19 LAB — CBC
HCT: 34.7 % — ABNORMAL LOW (ref 36.0–46.0)
Hemoglobin: 11.8 g/dL — ABNORMAL LOW (ref 12.0–15.0)
MCH: 30.3 pg (ref 26.0–34.0)
MCHC: 34 g/dL (ref 30.0–36.0)
MCV: 89.2 fL (ref 78.0–100.0)
PLATELETS: 182 10*3/uL (ref 150–400)
RBC: 3.89 MIL/uL (ref 3.87–5.11)
RDW: 13.4 % (ref 11.5–15.5)
WBC: 6.9 10*3/uL (ref 4.0–10.5)

## 2017-09-19 LAB — POCT URINALYSIS DIP (DEVICE)
Bilirubin Urine: NEGATIVE
GLUCOSE, UA: 250 mg/dL — AB
HGB URINE DIPSTICK: NEGATIVE
KETONES UR: NEGATIVE mg/dL
Nitrite: NEGATIVE
Protein, ur: NEGATIVE mg/dL
Urobilinogen, UA: 0.2 mg/dL (ref 0.0–1.0)
pH: 6.5 (ref 5.0–8.0)

## 2017-09-19 LAB — TYPE AND SCREEN
ABO/RH(D): A POS
Antibody Screen: NEGATIVE

## 2017-09-19 LAB — GLUCOSE, CAPILLARY
GLUCOSE-CAPILLARY: 206 mg/dL — AB (ref 65–99)
Glucose-Capillary: 131 mg/dL — ABNORMAL HIGH (ref 65–99)

## 2017-09-19 LAB — CREATININE, SERUM
CREATININE: 0.59 mg/dL (ref 0.44–1.00)
GFR calc Af Amer: >60 mL/min (ref >60)

## 2017-09-19 LAB — ABO/RH: ABO/RH(D): A POS

## 2017-09-19 LAB — HEMOGLOBIN A1C
HEMOGLOBIN A1C: 8.2 % — AB (ref 4.8–5.6)
Mean Plasma Glucose: 188.64 mg/dL

## 2017-09-19 MED ORDER — DOCUSATE SODIUM 100 MG PO CAPS: 100.0000 mg | ORAL_CAPSULE | Freq: Two times a day (BID) | ORAL | Status: DC | PRN

## 2017-09-19 MED ORDER — INSULIN NPH (HUMAN) (ISOPHANE) 100 UNIT/ML ~~LOC~~ SUSP
48.0000 [IU] | Freq: Every day | SUBCUTANEOUS | Status: DC
  Administered 2017-09-20: 48 [IU] via SUBCUTANEOUS
  Filled 2017-09-19: qty 10

## 2017-09-19 MED ORDER — ZOLPIDEM TARTRATE 5 MG PO TABS: 5.0000 mg | ORAL_TABLET | Freq: Every evening | ORAL | Status: DC | PRN

## 2017-09-19 MED ORDER — INSULIN NPH (HUMAN) (ISOPHANE) 100 UNIT/ML ~~LOC~~ SUSP
32.0000 [IU] | Freq: Every day | SUBCUTANEOUS | Status: DC
  Administered 2017-09-19 – 2017-09-21 (×3): 32 [IU] via SUBCUTANEOUS

## 2017-09-19 MED ORDER — CEPHALEXIN 500 MG PO CAPS
500.0000 mg | ORAL_CAPSULE | Freq: Four times a day (QID) | ORAL | Status: DC
  Administered 2017-09-19 – 2017-09-23 (×14): 500 mg via ORAL
  Filled 2017-09-19: qty 1

## 2017-09-19 MED ORDER — INSULIN ASPART 100 UNIT/ML ~~LOC~~ SOLN
25.0000 [IU] | Freq: Three times a day (TID) | SUBCUTANEOUS | Status: DC
  Administered 2017-09-19 – 2017-09-20 (×2): 25 [IU] via SUBCUTANEOUS

## 2017-09-19 MED ORDER — CALCIUM CARBONATE ANTACID 500 MG PO CHEW: 2.0000 | CHEWABLE_TABLET | ORAL | Status: DC | PRN

## 2017-09-19 MED ORDER — PRENATAL MULTIVITAMIN CH
1.0000 | ORAL_TABLET | Freq: Every day | ORAL | Status: DC
  Administered 2017-09-20 – 2017-09-22 (×3): 1 via ORAL
  Filled 2017-09-19 (×3): qty 1

## 2017-09-19 MED ORDER — INSULIN ASPART 100 UNIT/ML ~~LOC~~ SOLN
0.0000 [IU] | Freq: Four times a day (QID) | SUBCUTANEOUS | Status: DC
  Administered 2017-09-19: 4 [IU] via SUBCUTANEOUS
  Administered 2017-09-20 (×2): 2 [IU] via SUBCUTANEOUS
  Administered 2017-09-22: 8 [IU] via SUBCUTANEOUS

## 2017-09-19 MED ORDER — ACETAMINOPHEN 325 MG PO TABS: 650.0000 mg | ORAL_TABLET | ORAL | Status: DC | PRN

## 2017-09-19 MED ORDER — ENOXAPARIN SODIUM 60 MG/0.6ML ~~LOC~~ SOLN
60.0000 mg | SUBCUTANEOUS | Status: DC
  Administered 2017-09-19 – 2017-09-22 (×4): 60 mg via SUBCUTANEOUS
  Filled 2017-09-19 (×4): qty 0.6

## 2017-09-19 NOTE — Progress Notes (Signed)
Prenatal Visit Note Date: 09/19/2017 Clinic: [facility]  Subjective:  Samantha Bowers is a 22 y.o. G1P0000 at 7576w0d being seen today for ongoing prenatal care.  She is currently monitored for the following issues for this high-risk pregnancy and has GBS (group B streptococcus) UTI complicating pregnancy; Type 2 diabetes mellitus affecting pregnancy, antepartum; Chlamydia infection complicating pregnancy; Supervision of high risk pregnancy, antepartum; Noncompliance; and Insulin controlled gestational diabetes mellitus (GDM) during pregnancy, antepartum on their problem list.  Patient reports no complaints.   Contractions: Irregular. Vag. Bleeding: None.  Movement: Present. Denies leaking of fluid.   The following portions of the patient's history were reviewed and updated as appropriate: allergies, current medications, past family history, past medical history, past social history, past surgical history and problem list. Problem list updated.  Objective:   Vitals:   09/19/17 0833  BP: 128/76  Pulse: 94  Weight: 271 lb (122.9 kg)    Fetal Status: Fetal Heart Rate (bpm): rNST   Movement: Present  Presentation: Vertex  General:  Alert, oriented and cooperative. Patient is in no acute distress.  Skin: Skin is warm and dry. No rash noted.   Cardiovascular: Normal heart rate noted  Respiratory: Normal respiratory effort, no problems with respiration noted  Abdomen: Soft, gravid, appropriate for gestational age. Pain/Pressure: Present     Pelvic:  Cervical exam deferred        Extremities: Normal range of motion.  Edema: None  Mental Status: Normal mood and affect. Normal behavior. Normal judgment and thought content.   Urinalysis: Urine Protein: Negative Urine Glucose: 2+  Assessment and Plan:  Pregnancy: G1P0000 at 8776w0d  1. DM2 in pregnancy Pt on NPH 36/32 and lispro 28/28/28. BS values are poorly controlled. AM fastings in the 190s, 2hr PP are  220s-280s/110s-200s/220s-high 200s and some int he 300s. Pt admits to not eating an optimal diet. bpp 10/10 but I told her with such uncontrolled DM2 for such a long period (pt's #s have been in this range for at least a month based on her log book) that I recommend ap admission which she is amenable today.   She's currently on 152 units per day of insulin. Based on 2/3 in am and 1/3 in pm with 2/3 of am dose being nph and 1/2 of pm dose being nph, probably should be around nph 66/26 and lispro 20/20/20.  Will start her on nph 48/32 and lispro 25/25/25  2. Group B Streptococcus urinary tract infection affecting pregnancy in third trimester toc for mid to late may needed  3. Supervision of high risk pregnancy, antepartum Routine care - Cervicovaginal ancillary only - GC/Chlamydia probe amp ([place])not at [facility]  4. Chlamydia infection affecting pregnancy in second trimester - Cervicovaginal ancillary only  Preterm labor symptoms and general obstetric precautions including but not limited to vaginal bleeding, contractions, leaking of fluid and fetal movement were reviewed in detail with the patient. Please refer to After Visit Summary for other counseling recommendations.  Return in about 1 week (around 09/26/2017) for hrob. bpp, Rober Minionnst.   Onda Kattner, MD

## 2017-09-19 NOTE — H&P (Signed)
Samantha Bowers is a 22 y.o. female G1P0 at 5136 weeks with poorly controlled diabetes admitted for diabetes control. Patient with prenatal care at [facility] complicated by type 2 DM on insulin. Patient reports taking her insulin as instructed but admits to being non compliant with her diet. She reports fasting as high as 190 and pp as high as 300 (most in the 200). She reports good fetal movement, she denies vaginal bleeding, leakage of fluid or regular contractions.   OB History    Gravida  1   Para  0   Term  0   Preterm  0   AB  0   Living  0     SAB  0   TAB  0   Ectopic  0   Multiple  0   Live Births  0          Past Medical History:  Diagnosis Date  . Diabetes mellitus without complication [facility](HCC)    Past Surgical History:  Procedure Laterality Date  . NO PAST SURGERIES     Family History: family history includes Diabetes in her father and mother. Social History:  reports that she has never smoked. She has never used smokeless tobacco. She reports that she does not drink alcohol or use drugs.     Maternal Diabetes: Yes:  Diabetes Type:  Pre-pregnancy, Insulin/Medication controlled Genetic Screening:  Maternal Ultrasounds/Referrals: Normal Fetal Ultrasounds or other Referrals:  Fetal echo- normal Maternal Substance Abuse:  No Significant Maternal Medications:  None Significant Maternal Lab Results:  None Other Comments:  None  ROS  See pertinent in HPI History   Blood pressure 132/73, pulse 87, temperature 98.1 F (36.7 C), temperature source Oral, resp. rate 18, height 6' (1.829 m), weight 272 lb (123.4 kg), last menstrual period 01/10/2017, SpO2 99 %. Exam Physical Exam  GENERAL: Well-developed, well-nourished female in no acute distress.  LUNGS: Clear to auscultation bilaterally.  HEART: Regular rate and rhythm. ABDOMEN: Soft, nontender, gravid PELVIC: Not indicated EXTREMITIES: No cyanosis, clubbing, or edema, 2+ distal pulses.  FHT: baseline 135,  mod variability, +accels, no decels Toco: no contractions  Prenatal labs: ABO, Rh: A/Positive/-- (11/01 0000) Antibody: Negative (11/01 0000) Rubella: Immune (11/01 0000) RPR: Non Reactive (02/21 1134)  HBsAg: Negative (11/01 0000)  HIV: Non Reactive (02/21 1134)  GBS:   positive  Assessment/Plan: 22 yo G1P0 at 36 weeks with type 2 DM admitted for glycemic control - Reviewed importance of euglycemia in this peripartum period - Reviewed fetal impact of poorly controlled diabetes including but not limited to risks of IUFD, neonatal demise, neonatal hypoglycemia, neonatal seizures, prolonged NICU admission - Admit for observation - Will adjust insulin as needed   Thurmond Hildebran 09/19/2017, 5:28 PM

## 2017-09-19 NOTE — Progress Notes (Signed)
Pt informed that the ultrasound is considered a limited OB ultrasound and is not intended to be a complete ultrasound exam.  Patient also informed that the ultrasound is not being completed with the intent of assessing for fetal or placental anomalies or any pelvic abnormalities.  Explained that the purpose of today's ultrasound is to assess for  BPP, presentation and AFI.  Patient acknowledges the purpose of the exam and the limitations of the study.    

## 2017-09-19 NOTE — Progress Notes (Signed)
Patient reports MAU visit 2 days ago for contractions/spotting.

## 2017-09-20 DIAGNOSIS — O24113 Pre-existing diabetes mellitus, type 2, in pregnancy, third trimester: Principal | ICD-10-CM

## 2017-09-20 DIAGNOSIS — E1165 Type 2 diabetes mellitus with hyperglycemia: Secondary | ICD-10-CM

## 2017-09-20 DIAGNOSIS — R7309 Other abnormal glucose: Secondary | ICD-10-CM

## 2017-09-20 LAB — GLUCOSE, CAPILLARY
GLUCOSE-CAPILLARY: 94 mg/dL (ref 65–99)
GLUCOSE-CAPILLARY: 89 mg/dL (ref 65–99)
GLUCOSE-CAPILLARY: 169 mg/dL — AB (ref 65–99)
Glucose-Capillary: 195 mg/dL — ABNORMAL HIGH (ref 65–99)
Glucose-Capillary: 160 mg/dL — ABNORMAL HIGH (ref 65–99)

## 2017-09-20 MED ORDER — METFORMIN HCL 500 MG PO TABS
500.0000 mg | ORAL_TABLET | Freq: Two times a day (BID) | ORAL | Status: DC
  Administered 2017-09-20 – 2017-09-22 (×5): 500 mg via ORAL
  Filled 2017-09-20 (×7): qty 1

## 2017-09-20 MED ORDER — INSULIN ASPART 100 UNIT/ML ~~LOC~~ SOLN
27.0000 [IU] | Freq: Three times a day (TID) | SUBCUTANEOUS | Status: DC
  Administered 2017-09-20 (×2): 27 [IU] via SUBCUTANEOUS

## 2017-09-20 MED ORDER — INSULIN NPH (HUMAN) (ISOPHANE) 100 UNIT/ML ~~LOC~~ SUSP
50.0000 [IU] | Freq: Every day | SUBCUTANEOUS | Status: DC
  Filled 2017-09-20: qty 10

## 2017-09-20 NOTE — Progress Notes (Signed)
Spoke to patient on the phone. Stated that she was diagnosed with diabetes a few years ago and was started on Metformin, but then was taken off the medication.  She started on the NPH and Humalog insulin when she became pregnant. She states that she is taking her insulin every day.  States that she goes back to the high risk clinic next Thursday. She did talk with Pincus LargeBeverly Paddock at [facility] about 1 week ago and she gave her lots of good information about her diet and CHO counting. Told her that the diabetes coordinator would be following her blood sugars while in the hospital.  Smith MinceKendra Dannya Pitkin RN BSN CDE Diabetes Coordinator Pager: [phone]  8am-5pm

## 2017-09-20 NOTE — Progress Notes (Signed)
[facility] COMPREHENSIVE PROGRESS NOTE  Samantha Bowers is a 22 y.o. G1P0000 at 78w1dwho is admitted for poor glycemic control/hyperglycemia in the setting of known Type II DM.  Patient is noncompliant with her diet.   Estimated Date of Delivery: 10/17/17 Fetal presentation is cephalic.  Length of Stay:  1 Days. Admitted 09/19/2017  Subjective: No complaints. Having 1-2 contractions, desires cervical check. Patient reports good fetal movement.  She reports rare uterine contractions, no bleeding and no loss of fluid per vagina.  Vitals:  Blood pressure 131/79, pulse 63, temperature 98 F (36.7 C), temperature source Oral, resp. rate 18, height 6' (1.829 m), weight 272 lb (123.4 kg), last menstrual period 01/10/2017, SpO2 100 %. Physical Examination: CONSTITUTIONAL: Well-developed, well-nourished female in no acute distress.  HENT:  Normocephalic, atraumatic, External right and left ear normal. Oropharynx is clear and moist EYES: Conjunctivae and EOM are normal. Pupils are equal, round, and reactive to light. No scleral icterus.  NECK: Normal range of motion, supple, no masses SKIN: Skin is warm and dry. No rash noted. Not diaphoretic. No erythema. No pallor. NEnders Alert and oriented to person, place, and time. Normal reflexes, muscle tone coordination. No cranial nerve deficit noted. PSYCHIATRIC: Normal mood and affect. Normal behavior. Normal judgment and thought content. CARDIOVASCULAR: Normal heart rate noted, regular rhythm RESPIRATORY: Effort and breath sounds normal, no problems with respiration noted MUSCULOSKELETAL: Normal range of motion. No edema and no tenderness. 2+ distal pulses. ABDOMEN: Soft, nontender, nondistended, gravid. CERVIX: Dilation: 1 Effacement (%): 30 Cervical Position: Posterior Station: Ballotable Exam by:: Dr AEster Rink Fetal monitoring: FHR: 125 bpm, Variability: moderate, Accelerations: Present, Decelerations: Absent  Uterine activity:  1-2 contractions per hour  Results for orders placed or performed during the hospital encounter of 09/19/17 (from the past 48 hour(s))  Hemoglobin A1c     Status: Abnormal   Collection Time: 09/19/17  3:48 PM  Result Value Ref Range   Hgb A1c MFr Bld 8.2 (H) 4.8 - 5.6 %    Comment: (NOTE) Pre diabetes:          5.7%-6.4% Diabetes:              >6.4% Glycemic control for   <7.0% adults with diabetes    Mean Plasma Glucose 188.64 mg/dL    Comment: Performed at [facility] [id number], [place] [id number] CBC on admission     Status: Abnormal   Collection Time: 09/19/17  4:10 PM  Result Value Ref Range   WBC 6.9 4.0 - 10.5 K/uL   RBC 3.89 3.87 - 5.11 MIL/uL   Hemoglobin 11.8 (L) 12.0 - 15.0 g/dL   HCT 34.7 (L) 36.0 - 46.0 %   MCV 89.2 78.0 - 100.0 fL   MCH 30.3 26.0 - 34.0 pg   MCHC 34.0 30.0 - 36.0 g/dL   RDW 13.4 11.5 - 15.5 %   Platelets 182 150 - 400 K/uL    Comment: Performed at [facility] [address] [place] [address] Creatinine, serum     Status: None   Collection Time: 09/19/17  4:10 PM  Result Value Ref Range   Creatinine, Ser 0.59 0.44 - 1.00 mg/dL   GFR calc non Af Amer >60 >60 mL/min   GFR calc Af Amer >60 >60 mL/min    Comment: (NOTE) The eGFR has been calculated using the CKD EPI equation. This calculation has not been validated in all clinical situations. eGFR's persistently <  60 mL/min signify possible Chronic Kidney Disease. Performed at [facility], [address] [place], [place] [address]   Type and screen     Status: None   Collection Time: 09/19/17  5:22 PM  Result Value Ref Range   ABO/RH(D) A POS    Antibody Screen NEG    Sample Expiration      09/22/2017 Performed at [facility], [address] [place], [place] [address]   ABO/Rh     Status: None   Collection Time: 09/19/17  5:22 PM  Result Value Ref Range   ABO/RH(D)      A POS Performed at [facility], [place], [place] [address]   Glucose, capillary     Status: Abnormal   Collection Time: 09/19/17  6:36 PM  Result Value Ref Range   Glucose-Capillary 131 (H) 65 - 99 mg/dL  Glucose, capillary     Status: Abnormal   Collection Time: 09/19/17  9:02 PM  Result Value Ref Range   Glucose-Capillary 206 (H) 65 - 99 mg/dL  Glucose, capillary     Status: None   Collection Time: 09/20/17  3:06 AM  Result Value Ref Range   Glucose-Capillary 94 65 - 99 mg/dL  Glucose, capillary     Status: None   Collection Time: 09/20/17  8:08 AM  Result Value Ref Range   Glucose-Capillary 89 65 - 99 mg/dL  Glucose, capillary     Status: Abnormal   Collection Time: 09/20/17 11:08 AM  Result Value Ref Range   Glucose-Capillary 169 (H) 65 - 99 mg/dL   Comment 1 Notify RN    Comment 2 Document in Chart     US Fetal Bpp W/nonstress  Result Date: 09/19/2017 ----------------------------------------------------------------------  OBSTETRICS REPORT                      (Signed Final 09/19/2017 12:35 pm) ---------------------------------------------------------------------- Patient Info  ID #:       [id number]                          D.O.B.:  April 21, 1996 (22 yrs)  Name:       Samantha Bowers                 Visit Date: 09/19/2017 11:45 am ---------------------------------------------------------------------- Performed By  Performed By:     Derinda Late RN      Ref. Address:     [address]                    [place], [place]  [place]  Attending:        Aletha Halim MD     [facility]         [facility]                                                             [facility]  Referred By:      Aletha Halim MD  ---------------------------------------------------------------------- Orders   #  Description                                 Code   1  US FETAL BPP W/NONSTRESS                    56213.0  ----------------------------------------------------------------------   #  Ordered By               Order #        Accession #    Episode #   1  Aletha Halim          [id number]      [id number]     [id number]  ---------------------------------------------------------------------- Indications   [redacted] weeks gestation of pregnancy                Z3A.36   Gestational diabetes in pregnancy, insulin     O24.414   controlled  ---------------------------------------------------------------------- OB History  Gravidity:    1 ---------------------------------------------------------------------- Fetal Evaluation  Num Of Fetuses:     1  Preg. Location:     Intrauterine  Cardiac Activity:   Observed  Fetal Lie:          Maternal left side  Presentation:       Cephalic  Amniotic Fluid  AFI FV:      Subjectively within normal limits  AFI Sum(cm)     %Tile       Largest Pocket(cm)  18.48           69          5.87  RUQ(cm)       RLQ(cm)       LUQ(cm)        LLQ(cm)  4.43          4.93          5.87           3.25 ---------------------------------------------------------------------- Biophysical Evaluation  Amniotic F.V:   Pocket => 2 cm two         F. Tone:        Observed                  planes  F. Movement:    Observed  N.S.T:          Reactive  F. Breathing:   Observed                   Score:          10/10 ---------------------------------------------------------------------- Gestational Age  LMP:           36w 0d        Date:  01/10/17                 EDD:   10/17/17  Best:          Harolyn Rutherford 0d     Det. By:  LMP  (01/10/17)          EDD:   10/17/17 ---------------------------------------------------------------------- Impression  Reassuring AP testing ----------------------------------------------------------------------  Recommendations  Repeat one week. ----------------------------------------------------------------------                Aletha Halim, MD Electronically Signed Final Report   09/19/2017 12:35 pm ----------------------------------------------------------------------   Current scheduled medications . cephALEXin  500 mg Oral Q6H  . enoxaparin (LOVENOX) injection  60 mg Subcutaneous Q24H  . insulin aspart  0-12 Units Subcutaneous QID  . insulin aspart  27 Units Subcutaneous TID WC  . insulin NPH Human  32 Units Subcutaneous QHS  . [START ON 09/21/2017] insulin NPH Human  50 Units Subcutaneous QAC breakfast  . metFORMIN  500 mg Oral BID WC  . prenatal multivitamin  1 tablet Oral Q1200    I have reviewed the patient's current medications.  ASSESSMENT: Principal Problem:   Pre-existing type 2 diabetes mellitus with hyperglycemia during pregnancy in third trimester ([place]) Active Problems:   GBS (group B streptococcus) UTI complicating pregnancy   Supervision of high risk pregnancy, antepartum   Poor glycemic control   PLAN: Insulin regimen changed based on CBGs, now NPH 50/32, Novolog 27 with meals. Continue SS.  Metformin 500 bid also started. DM educator to see patient soon Reassuring FHR tracing. Continue routine antenatal care.   Verita Schneiders, MD, [place] [facility] [employer], [place]

## 2017-09-20 NOTE — Progress Notes (Addendum)
Results for [name], Samantha D (MRN [id number]) as of 09/20/2017 14:03  Ref. Range 09/19/2017 18:36 09/19/2017 21:02 09/20/2017 03:06 09/20/2017 08:08 09/20/2017 11:08  Glucose-Capillary Latest Ref Range: 65 - 99 mg/dL 782131 (H) 956206 (H) 94 89 213169 (H)  Type 2 diabetes Home diabetes medications: NPH 36 units at breakfast, 32 units at bedtime, Humalog 28 units TID, and Metformin  Received diabetes coordinator consult.  Noted that patient has Type 2 diabetes and is [redacted] weeks gestation.  Is followed by the high risk pregnancy clinic and was seen by Pincus LargeBeverly Paddock on 09/12/[facility] for diet instruction and blood sugar control.  Noted that patient takes NPH, Humalog, and Metformin at home.   Will continue to monitor blood sugars while in the hospital and will make insulin adjustment recommendations if needed.  Smith MinceKendra Mariadelcarmen Corella RN BSN CDE Diabetes Coordinator Pager: [phone]  8am-5pm

## 2017-09-21 LAB — GLUCOSE, CAPILLARY
GLUCOSE-CAPILLARY: 108 mg/dL — AB (ref 65–99)
GLUCOSE-CAPILLARY: 122 mg/dL — AB (ref 65–99)
GLUCOSE-CAPILLARY: 143 mg/dL — AB (ref 65–99)
Glucose-Capillary: 85 mg/dL (ref 65–99)
Glucose-Capillary: 155 mg/dL — ABNORMAL HIGH (ref 65–99)
Glucose-Capillary: 151 mg/dL — ABNORMAL HIGH (ref 65–99)

## 2017-09-21 MED ORDER — INSULIN NPH (HUMAN) (ISOPHANE) 100 UNIT/ML ~~LOC~~ SUSP
56.0000 [IU] | Freq: Every day | SUBCUTANEOUS | Status: DC
  Administered 2017-09-21: 56 [IU] via SUBCUTANEOUS
  Filled 2017-09-21: qty 10

## 2017-09-21 MED ORDER — INSULIN ASPART 100 UNIT/ML ~~LOC~~ SOLN
30.0000 [IU] | Freq: Three times a day (TID) | SUBCUTANEOUS | Status: DC
  Administered 2017-09-21 – 2017-09-23 (×7): 30 [IU] via SUBCUTANEOUS

## 2017-09-21 MED ORDER — PROMETHAZINE HCL 25 MG PO TABS
25.0000 mg | ORAL_TABLET | Freq: Four times a day (QID) | ORAL | Status: DC | PRN
  Administered 2017-09-21 – 2017-09-23 (×3): 25 mg via ORAL
  Filled 2017-09-21 (×4): qty 1

## 2017-09-21 NOTE — Progress Notes (Signed)
FACULTY PRACTICE ANTEPARTUM(COMPREHENSIVE) NOTE  Samantha Bowers is a 22 y.o. G1P0000 at [redacted]w[redacted]d  who is admitted for poor glycemic control in the face of known DM type II Fetal presentation is cephalic. Length of Stay:  2  Days  Subjective: Pt acknowledges the need to change diet at home: example pt willing to give up juices Pt spoken to over the phone x 2 by Smith Mince DM educator, not seen face-face Patient reports the fetal movement as active. Patient reports uterine contraction  activity as none. Patient reports  vaginal bleeding as none. Patient describes fluid per vagina as None.  Vitals:  Blood pressure (!) 104/54, pulse (!) 55, temperature 97.8 F (36.6 C), resp. rate 19, height 6' (1.829 m), weight 272 lb (123.4 kg), last menstrual period 01/10/2017, SpO2 100 %. Physical Examination:  General appearance - alert, well appearing, and in no distress and overweight Heart - normal rate and regular rhythm Abdomen - soft, nontender, nondistended Fundal Height:  size equals dates Cervical Exam: Not evaluated. fetal presentation is cephalic. Extremities: extremities normal, atraumatic, no cyanosis or edema and Homans sign is negative, no sign of DVT with DTRs 2+ bilaterally Membranes:intact  Fetal Monitoring:  Baseline: 135 bpm, Variability: Good {> 6 bpm), Accelerations: Reactive and Decelerations: Absent  Labs:  Results for orders placed or performed during the hospital encounter of 09/19/17 (from the past 24 hour(s))  Glucose, capillary   Collection Time: 09/20/17  8:08 AM  Result Value Ref Range   Glucose-Capillary 89 65 - 99 mg/dL  Glucose, capillary   Collection Time: 09/20/17 11:08 AM  Result Value Ref Range   Glucose-Capillary 169 (H) 65 - 99 mg/dL   Comment 1 Notify RN    Comment 2 Document in Chart   Glucose, capillary   Collection Time: 09/20/17  5:07 PM  Result Value Ref Range   Glucose-Capillary 195 (H) 65 - 99 mg/dL   Comment 1 Notify RN    Comment 2  Document in Chart   Glucose, capillary   Collection Time: 09/20/17  9:53 PM  Result Value Ref Range   Glucose-Capillary 160 (H) 65 - 99 mg/dL  Glucose, capillary   Collection Time: 09/21/17  3:07 AM  Result Value Ref Range   Glucose-Capillary 108 (H) 65 - 99 mg/dL    Imaging Studies:     Currently EPIC will not allow sonographic studies to automatically populate into notes.  In the meantime, copy and paste results into note or free text.  Medications:  Scheduled . cephALEXin  500 mg Oral Q6H  . enoxaparin (LOVENOX) injection  60 mg Subcutaneous Q24H  . insulin aspart  0-12 Units Subcutaneous QID  . insulin aspart  27 Units Subcutaneous TID WC  . insulin NPH Human  32 Units Subcutaneous QHS  . insulin NPH Human  50 Units Subcutaneous QAC breakfast  . metFORMIN  500 mg Oral BID WC  . prenatal multivitamin  1 tablet Oral Q1200   I have reviewed the patient's current medications.  ASSESSMENT: Patient Active Problem List   Diagnosis Date Noted  . GBS (group B streptococcus) UTI complicating pregnancy 07/12/2017  . Pre-existing type 2 diabetes mellitus with hyperglycemia during pregnancy in third trimester (HCC) 07/12/2017  . Chlamydia infection complicating pregnancy 07/12/2017  . Supervision of high risk pregnancy, antepartum 07/12/2017  . Poor glycemic control 07/12/2017    PLAN: Will adjust insulins: NPH incr to 56 u q am, 32u q pm Aspart incr to 30 u q meal Continue SSI  0-12 q shift NST Tilda Burrow 09/21/2017,7:28 AM

## 2017-09-22 LAB — GLUCOSE, CAPILLARY
Glucose-Capillary: 160 mg/dL — ABNORMAL HIGH (ref 65–99)
Glucose-Capillary: 86 mg/dL (ref 65–99)
Glucose-Capillary: 155 mg/dL — ABNORMAL HIGH (ref 65–99)
Glucose-Capillary: 320 mg/dL — ABNORMAL HIGH (ref 65–99)
Glucose-Capillary: 106 mg/dL — ABNORMAL HIGH (ref 65–99)

## 2017-09-22 MED ORDER — INSULIN NPH (HUMAN) (ISOPHANE) 100 UNIT/ML ~~LOC~~ SUSP
60.0000 [IU] | Freq: Every day | SUBCUTANEOUS | Status: DC
  Administered 2017-09-22 – 2017-09-23 (×2): 60 [IU] via SUBCUTANEOUS
  Filled 2017-09-22: qty 10

## 2017-09-22 MED ORDER — INSULIN NPH (HUMAN) (ISOPHANE) 100 UNIT/ML ~~LOC~~ SUSP
36.0000 [IU] | Freq: Every day | SUBCUTANEOUS | Status: DC
  Administered 2017-09-22: 36 [IU] via SUBCUTANEOUS

## 2017-09-22 NOTE — Progress Notes (Addendum)
CBG goals in Pregnancy: Fasting glucose <90 mg/dl 2-hour Postprandial glucose <120 mg/dl    Results for Samantha Bowers, Samantha Bowers (MRN [id number]) as of 09/22/2017 10:31  Ref. Range 09/21/2017 03:07 09/21/2017 08:18 09/21/2017 10:00 09/21/2017 13:46 09/21/2017 16:36 09/21/2017 22:14  Glucose-Capillary Latest Ref Range: 65 - 99 mg/dL 562 (H) 85  30 units NOVOLOG +  56 units NPH  155 (H) 122 (H)  30 units NOVOLOG at 2pm 151 (H) 143 (H)    32 units NPH   Results for Samantha Bowers, Samantha Bowers (MRN [id number]) as of 09/22/2017 10:31  Ref. Range 09/22/2017 03:25 09/22/2017 08:12  Glucose-Capillary Latest Ref Range: 65 - 99 mg/dL 696 (H) 86    Current Orders: NPH Insulin 60 units AM/ 36 units PM       Novolog 30 units TID with meals       Novolog 0-12 units 2 hours after meals (for CBG >160 mg/dl)      Metformin 295 mg BID     Note NPH Insulin doses increased this AM.  Will get both increased doses today.  May consider increasing Novolog Meal Coverage to 32 units TID with meals   Note that patient has Type 2 diabetes and is [redacted] weeks gestation.  Is followed by the high risk pregnancy clinic and was seen by Pincus Large on 09/12/[facility] at [facility] for diet instruction and blood sugar control.  Noted that patient takes NPH, Humalog, and Metformin at home.      --Will follow patient during hospitalization--  Ambrose Finland RN, MSN, CDE Diabetes Coordinator Inpatient Glycemic Control Team Team Pager: [phone] (8a-5p)

## 2017-09-22 NOTE — Progress Notes (Signed)
FACULTY PRACTICE ANTEPARTUM PROGRESS NOTE  Samantha Bowers is a 22 y.o. G1P0000 at [redacted]w[redacted]d who is admitted for diabetic management.  Estimated Date of Delivery: 10/17/17 Fetal presentation is cephalic.  Length of Stay:  3 Days. Admitted 09/19/2017  Subjective:  Patient reports normal fetal movement.  She denies uterine contractions, denies bleeding and leaking of fluid per vagina. Reports last meal was 8 pm, 3 am CBG was truly fasting. Reports she is trying to improve diet per suggestions (switching from coke to diet coke) but is also eating like she would at home.  Vitals:  Blood pressure (!) 95/43, pulse 68, temperature 97.9 F (36.6 C), resp. rate 18, height 6' (1.829 m), weight 272 lb (123.4 kg), last menstrual period 01/10/2017, SpO2 100 %. Physical Examination: CONSTITUTIONAL: Well-developed, well-nourished female in no acute distress.  HENT:  Normocephalic, atraumatic, External right and left ear normal. Oropharynx is clear and moist EYES: Conjunctivae and EOM are normal. Pupils are equal, round, and reactive to light. No scleral icterus.  NECK: Normal range of motion, supple, no masses. SKIN: Skin is warm and dry. No rash noted. Not diaphoretic. No erythema. No pallor. NEUROLGIC: Alert and oriented to person, place, and time. Normal reflexes, muscle tone coordination. No cranial nerve deficit noted. PSYCHIATRIC: Normal mood and affect. Normal behavior. Normal judgment and thought content. CARDIOVASCULAR: Normal heart rate noted, regular rhythm RESPIRATORY: Effort and breath sounds normal, no problems with respiration noted MUSCULOSKELETAL: Normal range of motion. No edema and no tenderness. ABDOMEN: Soft, nontender, nondistended, gravid. CERVIX: deferred  Fetal monitoring: from yesterday 4 pm FHR: 130 bpm, Variability: moderate, Accelerations: Present, Decelerations: Absent  Uterine activity: no contractions per hour  Results for orders placed or performed during the hospital  encounter of 09/19/17 (from the past 48 hour(s))  Glucose, capillary     Status: None   Collection Time: 09/20/17  8:08 AM  Result Value Ref Range   Glucose-Capillary 89 65 - 99 mg/dL  Glucose, capillary     Status: Abnormal   Collection Time: 09/20/17 11:08 AM  Result Value Ref Range   Glucose-Capillary 169 (H) 65 - 99 mg/dL   Comment 1 Notify RN    Comment 2 Document in Chart   Glucose, capillary     Status: Abnormal   Collection Time: 09/20/17  5:07 PM  Result Value Ref Range   Glucose-Capillary 195 (H) 65 - 99 mg/dL   Comment 1 Notify RN    Comment 2 Document in Chart   Glucose, capillary     Status: Abnormal   Collection Time: 09/20/17  9:53 PM  Result Value Ref Range   Glucose-Capillary 160 (H) 65 - 99 mg/dL  Glucose, capillary     Status: Abnormal   Collection Time: 09/21/17  3:07 AM  Result Value Ref Range   Glucose-Capillary 108 (H) 65 - 99 mg/dL  Glucose, capillary     Status: None   Collection Time: 09/21/17  8:18 AM  Result Value Ref Range   Glucose-Capillary 85 65 - 99 mg/dL  Glucose, capillary     Status: Abnormal   Collection Time: 09/21/17 10:00 AM  Result Value Ref Range   Glucose-Capillary 155 (H) 65 - 99 mg/dL  Glucose, capillary     Status: Abnormal   Collection Time: 09/21/17  1:46 PM  Result Value Ref Range   Glucose-Capillary 122 (H) 65 - 99 mg/dL  Glucose, capillary     Status: Abnormal   Collection Time: 09/21/17  4:36 PM  Result Value  Ref Range   Glucose-Capillary 151 (H) 65 - 99 mg/dL   Comment 1 Notify RN    Comment 2 Document in Chart   Glucose, capillary     Status: Abnormal   Collection Time: 09/21/17 10:14 PM  Result Value Ref Range   Glucose-Capillary 143 (H) 65 - 99 mg/dL  Glucose, capillary     Status: Abnormal   Collection Time: 09/22/17  3:25 AM  Result Value Ref Range   Glucose-Capillary 160 (H) 65 - 99 mg/dL    No results found.  Current scheduled medications . cephALEXin  500 mg Oral Q6H  . enoxaparin (LOVENOX) injection   60 mg Subcutaneous Q24H  . insulin aspart  0-12 Units Subcutaneous QID  . insulin aspart  30 Units Subcutaneous TID WC  . insulin NPH Human  36 Units Subcutaneous QHS  . insulin NPH Human  60 Units Subcutaneous QAC breakfast  . metFORMIN  500 mg Oral BID WC  . prenatal multivitamin  1 tablet Oral Q1200    I have reviewed the patient's current medications.  ASSESSMENT: Principal Problem:   Pre-existing type 2 diabetes mellitus with hyperglycemia during pregnancy in third trimester (HCC) Active Problems:   GBS (group B streptococcus) UTI complicating pregnancy   Supervision of high risk pregnancy, antepartum   Poor glycemic control   PLAN: CBGs all remain elevated NPH increased to 60 q am, 36 q pm Aspart remains same at 30 u q meal Cont SSI NST q shift   Continue routine antenatal care.   Baldemar Lenis, M.D. Attending Obstetrician & Gynecologist, [facility] for [employer], [facility]

## 2017-09-22 NOTE — Progress Notes (Signed)
Pt. In bathroom.

## 2017-09-23 LAB — GC/CHLAMYDIA PROBE AMP (~~LOC~~) NOT AT ARMC
CHLAMYDIA, DNA PROBE: NEGATIVE
NEISSERIA GONORRHEA: NEGATIVE

## 2017-09-23 LAB — CERVICOVAGINAL ANCILLARY ONLY
Chlamydia: NEGATIVE
NEISSERIA GONORRHEA: NEGATIVE
Trichomonas: NEGATIVE

## 2017-09-23 LAB — GLUCOSE, CAPILLARY
GLUCOSE-CAPILLARY: 69 mg/dL (ref 65–99)
Glucose-Capillary: 67 mg/dL (ref 65–99)

## 2017-09-23 MED ORDER — INSULIN NPH (HUMAN) (ISOPHANE) 100 UNIT/ML ~~LOC~~ SUSP: 36.0000 [IU] | Freq: Every day | SUBCUTANEOUS | 11 refills | Status: DC

## 2017-09-23 MED ORDER — PROMETHAZINE HCL 25 MG PO TABS: 25.0000 mg | ORAL_TABLET | Freq: Four times a day (QID) | ORAL | 2 refills | Status: DC | PRN

## 2017-09-23 MED ORDER — INSULIN ASPART 100 UNIT/ML ~~LOC~~ SOLN: 30.0000 [IU] | Freq: Three times a day (TID) | SUBCUTANEOUS | 11 refills | Status: DC

## 2017-09-23 MED ORDER — INSULIN LISPRO 100 UNIT/ML CARTRIDGE: 30.0000 [IU] | Freq: Three times a day (TID) | SUBCUTANEOUS | 11 refills | Status: DC

## 2017-09-23 MED ORDER — INSULIN NPH (HUMAN) (ISOPHANE) 100 UNIT/ML ~~LOC~~ SUSP: 60.0000 [IU] | Freq: Every day | SUBCUTANEOUS | 11 refills | Status: DC

## 2017-09-23 MED ORDER — METFORMIN HCL 500 MG PO TABS: 500.0000 mg | ORAL_TABLET | Freq: Two times a day (BID) | ORAL | 1 refills | Status: DC

## 2017-09-23 MED ORDER — PRENATAL MULTIVITAMIN CH: 1.0000 | ORAL_TABLET | Freq: Every day | ORAL | 1 refills | Status: DC

## 2017-09-23 NOTE — Discharge Instructions (Signed)
Your discharge insulin doses: NPH 60 q am, 30 units q evening, Lispro 30 units with meals.

## 2017-09-23 NOTE — Discharge Summary (Signed)
Physician Discharge Summary  Patient ID: [name] MRN: [id number] DOB/AGE: 20/22/1997 20 y.o.  Admit date: 09/19/2017 Discharge date: 09/23/2017  Admission Diagnoses:  Discharge Diagnoses:  Principal Problem:   Pre-existing type 2 diabetes mellitus with hyperglycemia during pregnancy in third trimester (HCC) Active Problems:   GBS (group B streptococcus) UTI complicating pregnancy   Supervision of high risk pregnancy, antepartum   Poor glycemic control   Discharged Condition: good  Hospital Course: *Samantha Bowers is a 22 y.o. female G1P0 at 39 weeks with poorly controlled diabetes admitted for diabetes control. Patient with prenatal care at [facility] complicated by type 2 DM on insulin. Patient reports taking her insulin as instructed but admits to being non compliant with her diet. She reports fasting as high as 190 and pp as high as 300 (most in the 200). She reports good fetal movement, she denies vaginal bleeding, leakage of fluid or regular contractions.  She was admitted on 152 units insulin per day , started on NPH 48/32 and Meal Lispro 25/25/25.  Adjustments were made daily with discharge dosing of NPH 60/36 and Meal Lispro of 30/30/30. Sliding scale dose recommendations were 0-12 units for cbg over 160 Cbg's gradually improved , except when pt would knowingly drink soft drinks. Her CBG 300+ on the afternoon Sunday was after a can of ginger ale with sugar. Diabetic education Edrick Oh RN reviewed the  adjustments, and she will be seen by Pincus Large as an outpatient  Consults: diabetic education  Significant Diagnostic Studies: labs: Hgb A1C 8.2 Importance of avoiding high CHO meals repeatedly emphasized, and role of increased activity reviewed.  Treatments: insulin adjustments  Discharge Exam: Blood pressure (!) 113/48, pulse 72, temperature 98.2 F (36.8 C), temperature source Oral, resp. rate 18, height 6' (1.829 m), weight 272 lb (123.4 kg), last  menstrual period 01/10/2017, SpO2 100 %. General appearance: alert, cooperative and no distress Head: Normocephalic, without obvious abnormality, atraumatic GI: normal findings: bowel sounds normal Extremities: extremities normal, atraumatic, no cyanosis or edema  Disposition:  There are no questions and answers to display.          Follow-up Information    [facility] Follow up in 3 day(s).   Specialty:  Obstetrics and Gynecology Contact information: [address] [place] [address] [phone]        continue biweekly testing and office visits, NOTE sent to [facility] to arrange appt 3 days.  Signed: Tilda Burrow 09/23/2017, 6:50 AM

## 2017-09-24 ENCOUNTER — Encounter: Payer: Self-pay | Admitting: Obstetrics & Gynecology

## 2017-09-26 ENCOUNTER — Ambulatory Visit (INDEPENDENT_AMBULATORY_CARE_PROVIDER_SITE_OTHER): Payer: Medicaid Other | Admitting: Obstetrics & Gynecology

## 2017-09-26 ENCOUNTER — Ambulatory Visit: Payer: Self-pay

## 2017-09-26 ENCOUNTER — Ambulatory Visit (INDEPENDENT_AMBULATORY_CARE_PROVIDER_SITE_OTHER): Payer: Medicaid Other | Admitting: General Practice

## 2017-09-26 ENCOUNTER — Ambulatory Visit: Payer: Medicaid Other | Admitting: Registered"

## 2017-09-26 VITALS — BP 135/72 | HR 94 | Wt 278.0 lb

## 2017-09-26 DIAGNOSIS — B951 Streptococcus, group B, as the cause of diseases classified elsewhere: Secondary | ICD-10-CM

## 2017-09-26 DIAGNOSIS — O2343 Unspecified infection of urinary tract in pregnancy, third trimester: Secondary | ICD-10-CM

## 2017-09-26 DIAGNOSIS — E1165 Type 2 diabetes mellitus with hyperglycemia: Secondary | ICD-10-CM

## 2017-09-26 DIAGNOSIS — O24113 Pre-existing diabetes mellitus, type 2, in pregnancy, third trimester: Secondary | ICD-10-CM | POA: Diagnosis present

## 2017-09-26 DIAGNOSIS — O0990 Supervision of high risk pregnancy, unspecified, unspecified trimester: Secondary | ICD-10-CM

## 2017-09-26 LAB — POCT URINALYSIS DIP (DEVICE)
Bilirubin Urine: NEGATIVE
Glucose, UA: NEGATIVE mg/dL
Ketones, ur: NEGATIVE mg/dL
NITRITE: NEGATIVE
PH: 7 (ref 5.0–8.0)
PROTEIN: NEGATIVE mg/dL
Specific Gravity, Urine: 1.01 (ref 1.005–1.030)
UROBILINOGEN UA: 0.2 mg/dL (ref 0.0–1.0)

## 2017-09-26 NOTE — Progress Notes (Signed)
Pt informed that the ultrasound is considered a limited OB ultrasound and is not intended to be a complete ultrasound exam.  Patient also informed that the ultrasound is not being completed with the intent of assessing for fetal or placental anomalies or any pelvic abnormalities.  Explained that the purpose of today's ultrasound is to assess for  BPP, presentation and AFI.  Patient acknowledges the purpose of the exam and the limitations of the study.    

## 2017-09-26 NOTE — Progress Notes (Signed)
   PRENATAL VISIT NOTE  Subjective:  Samantha Bowers is a 22 y.o. G1P0000 at [redacted]w[redacted]d being seen today for ongoing prenatal care.  She is currently monitored for the following issues for this high-risk pregnancy and has GBS (group B streptococcus) UTI complicating pregnancy; Pre-existing type 2 diabetes mellitus with hyperglycemia during pregnancy in third trimester (HCC); Chlamydia infection complicating pregnancy; Supervision of high risk pregnancy, antepartum; and Poor glycemic control on their problem list.  Patient reports no complaints.  Contractions: Not present. Vag. Bleeding: None.  Movement: Present. Denies leaking of fluid.   The following portions of the patient's history were reviewed and updated as appropriate: allergies, current medications, past family history, past medical history, past social history, past surgical history and problem list. Problem list updated.  Objective:   Vitals:   09/26/17 0942  BP: 135/72  Pulse: 94  Weight: 278 lb (126.1 kg)    Fetal Status: Fetal Heart Rate (bpm): NST   Movement: Present     General:  Alert, oriented and cooperative. Patient is in no acute distress.  Skin: Skin is warm and dry. No rash noted.   Cardiovascular: Normal heart rate noted  Respiratory: Normal respiratory effort, no problems with respiration noted  Abdomen: Soft, gravid, appropriate for gestational age.  Pain/Pressure: Present     Pelvic: Cervical exam deferred        Extremities: Normal range of motion.  Edema: Trace  Mental Status: Normal mood and affect. Normal behavior. Normal judgment and thought content.   Assessment and Plan:  Pregnancy: G1P0000 at [redacted]w[redacted]d  1. Pre-existing type 2 diabetes mellitus with hyperglycemia during pregnancy in third trimester (HCC) - she did not bring her sugars, just got out of the hospital about 3 days ago. Says that her fasting today was 71  2. Group B Streptococcus urinary tract infection affecting pregnancy in third trimester -  treat in labor  3. Supervision of high risk pregnancy, antepartum   Preterm labor symptoms and general obstetric precautions including but not limited to vaginal bleeding, contractions, leaking of fluid and fetal movement were reviewed in detail with the patient. Please refer to After Visit Summary for other counseling recommendations.  No follow-ups on file.  Future Appointments  Date Time Provider Department Center  10/03/2017  9:15 AM [facility] NST [facility]  10/03/2017 10:15 AM Allie Bossier, MD [facility]  10/10/2017  9:15 AM [facility] NST [facility]  10/10/2017 10:15 AM Allie Bossier, MD [facility]  10/17/2017  9:15 AM [facility] NST [facility]  10/17/2017 10:15 AM Allie Bossier, MD [facility] WOC    Allie Bossier, MD

## 2017-09-27 ENCOUNTER — Inpatient Hospital Stay (HOSPITAL_COMMUNITY)
Admission: AD | Admit: 2017-09-27 | Discharge: 2017-09-27 | Disposition: A | Discharge status: Home / Self Care | Payer: Medicaid Other | Source: Ambulatory Visit | Attending: Obstetrics & Gynecology | Admitting: Obstetrics & Gynecology

## 2017-09-27 ENCOUNTER — Encounter (HOSPITAL_COMMUNITY): Payer: Self-pay | Admitting: *Deleted

## 2017-09-27 ENCOUNTER — Encounter: Payer: Self-pay | Admitting: Obstetrics & Gynecology

## 2017-09-27 DIAGNOSIS — O1203 Gestational edema, third trimester: Secondary | ICD-10-CM | POA: Diagnosis not present

## 2017-09-27 DIAGNOSIS — R600 Localized edema: Secondary | ICD-10-CM

## 2017-09-27 DIAGNOSIS — O1202 Gestational edema, second trimester: Secondary | ICD-10-CM

## 2017-09-27 DIAGNOSIS — Z794 Long term (current) use of insulin: Secondary | ICD-10-CM | POA: Diagnosis not present

## 2017-09-27 DIAGNOSIS — E119 Type 2 diabetes mellitus without complications: Secondary | ICD-10-CM | POA: Diagnosis not present

## 2017-09-27 DIAGNOSIS — Z3A37 37 weeks gestation of pregnancy: Secondary | ICD-10-CM | POA: Insufficient documentation

## 2017-09-27 DIAGNOSIS — O24113 Pre-existing diabetes mellitus, type 2, in pregnancy, third trimester: Secondary | ICD-10-CM | POA: Insufficient documentation

## 2017-09-27 LAB — URINALYSIS, ROUTINE W REFLEX MICROSCOPIC
BILIRUBIN URINE: NEGATIVE
Hgb urine dipstick: NEGATIVE
KETONES UR: NEGATIVE mg/dL
Nitrite: NEGATIVE
PH: 7 (ref 5.0–8.0)
Protein, ur: NEGATIVE mg/dL
Specific Gravity, Urine: 1.003 — ABNORMAL LOW (ref 1.005–1.030)

## 2017-09-27 NOTE — Progress Notes (Signed)
I have reviewed this chart and agree with the RN/CMA assessment and management.    Mirela Parsley C Keymari Sato, MD, FACOG Attending Physician, [facility]  

## 2017-09-27 NOTE — MAU Provider Note (Signed)
History     CSN: [id number]  Arrival date and time: 09/27/17 2029   First Provider Initiated Contact with Patient 09/27/17 2101      Chief Complaint  Patient presents with  . Foot Swelling   HPI [name] is a 22 y.o. G1P0000 at [redacted]w[redacted]d who presents to MAU today with complaint of LE edema x 1 hour. The patient was sitting for a long time today to get her hair done and then noted the swelling. She denies headache, abdominal pain, vaginal bleeding, LOF or contractions. She is a T2DM on insulin. She reports normal FM and denies issues with HTN.   OB History    Gravida  1   Para  0   Term  0   Preterm  0   AB  0   Living  0     SAB  0   TAB  0   Ectopic  0   Multiple  0   Live Births  0           Past Medical History:  Diagnosis Date  . Diabetes mellitus without complication [facility])     Past Surgical History:  Procedure Laterality Date  . NO PAST SURGERIES      Family History  Problem Relation Age of Onset  . Diabetes Mother   . Diabetes Father     Social History   Tobacco Use  . Smoking status: Never Smoker  . Smokeless tobacco: Never Used  Substance Use Topics  . Alcohol use: No    Frequency: Never  . Drug use: No    Allergies: No Known Allergies  Medications Prior to Admission  Medication Sig Dispense Refill Last Dose  . cephALEXin (KEFLEX) 500 MG capsule Take 1 capsule (500 mg total) by mouth 4 (four) times daily. 28 capsule 0 Taking  . insulin lispro (HUMALOG) 100 UNIT/ML cartridge Inject 0.3 mLs (30 Units total) into the skin 3 (three) times daily with meals. 15 mL 11 Taking  . insulin NPH Human (HUMULIN N,NOVOLIN N) 100 UNIT/ML injection Inject 0.36 mLs (36 Units total) into the skin at bedtime. 10 mL 11 Taking  . insulin NPH Human (HUMULIN N,NOVOLIN N) 100 UNIT/ML injection Inject 0.6 mLs (60 Units total) into the skin daily before breakfast. 10 mL 11   . metFORMIN (GLUCOPHAGE) 500 MG tablet Take 1 tablet (500 mg total) by  mouth 2 (two) times daily with a meal. 30 tablet 1 Taking  . NIFEdipine (PROCARDIA) 10 MG capsule Take 1 capsule (10 mg total) by mouth every 6 (six) hours as needed (contractions). (Patient not taking: Reported on 09/19/2017) 10 capsule 0 Not Taking  . Prenatal Vit-Fe Fumarate-FA (PRENATAL MULTIVITAMIN) TABS tablet Take 1 tablet by mouth daily at 12 noon. 30 tablet 1 Taking  . promethazine (PHENERGAN) 25 MG tablet Take 1 tablet (25 mg total) by mouth every 6 (six) hours as needed for nausea or vomiting. 30 tablet 2 Taking    Review of Systems  Constitutional: Negative for fever.  Cardiovascular: Positive for leg swelling.  Gastrointestinal: Negative for abdominal pain, constipation, diarrhea, nausea and vomiting.  Genitourinary: Negative for vaginal bleeding and vaginal discharge.   Physical Exam   Temperature 98.6 F (37 C), resp. rate 18, height 6' (1.829 m), weight 278 lb (126.1 kg), last menstrual period 01/10/2017.  Physical Exam  Nursing note and vitals reviewed. Constitutional: She is oriented to person, place, and time. She appears well-developed and well-nourished. No distress.  HENT:  Head: Normocephalic and atraumatic.  Cardiovascular: Normal rate.  Respiratory: Effort normal.  GI: Soft. She exhibits no distension and no mass. There is no tenderness. There is no rebound and no guarding.  Musculoskeletal: She exhibits edema (very mild, non-pitting edema equal bilaterally).  Neurological: She is alert and oriented to person, place, and time. She has normal reflexes.  Skin: Skin is warm and dry. No erythema.  Psychiatric: She has a normal mood and affect.   Results for orders placed or performed during the hospital encounter of 09/27/17 (from the past 24 hour(s))  Urinalysis, Routine w reflex microscopic     Status: Abnormal   Collection Time: 09/27/17  8:45 PM  Result Value Ref Range   Color, Urine STRAW (A) YELLOW   APPearance HAZY (A) CLEAR   Specific Gravity, Urine  1.003 (L) 1.005 - 1.030   pH 7.0 5.0 - 8.0   Glucose, UA >=500 (A) NEGATIVE mg/dL   Hgb urine dipstick NEGATIVE NEGATIVE   Bilirubin Urine NEGATIVE NEGATIVE   Ketones, ur NEGATIVE NEGATIVE mg/dL   Protein, ur NEGATIVE NEGATIVE mg/dL   Nitrite NEGATIVE NEGATIVE   Leukocytes, UA MODERATE (A) NEGATIVE   RBC / HPF 0-5 0 - 5 RBC/hpf   WBC, UA 11-20 0 - 5 WBC/hpf   Bacteria, UA MANY (A) NONE SEEN   Squamous Epithelial / LPF 11-20 0 - 5    Fetal Monitoring: Baseline: 130 bpm Variability: moderate Accelerations: 15 x 15 Decelerations: none Contractions: few, irregular  MAU Course  Procedures None  MDM UA today  Urine culture ordered  Normotensive and asymptomatic of pre-eclampsia  Assessment and Plan  A: SIUP at [redacted]w[redacted]d Mild LE edema   P: Discharge home Warning signs for worsening condition and labor precautions discussed Patient advised to follow-up with [facility] as scheduled for routine prenatal care Patient may return to MAU as needed or if her condition were to change or worsen   Vonzella Nipple, PA-C 09/27/2017, 9:02 PM

## 2017-09-27 NOTE — MAU Note (Signed)
Feet have been swollen for hour or so. Has not been on feet more than usual today. Denies LOF or bleeding. Some yellow/clear d/c. No pain

## 2017-09-27 NOTE — Discharge Instructions (Signed)
Edema Edema is when you have too much fluid in your body or under your skin. Edema may make your legs, feet, and ankles swell up. Swelling is also common in looser tissues, like around your eyes. This is a common condition. It gets more common as you get older. There are many possible causes of edema. Eating too much salt (sodium) and being on your feet or sitting for a long time can cause edema in your legs, feet, and ankles. Hot weather may make edema worse. Edema is usually painless. Your skin may look swollen or shiny. Follow these instructions at home:  Keep the swollen body part raised (elevated) above the level of your heart when you are sitting or lying down.  Do not sit still or stand for a long time.  Do not wear tight clothes. Do not wear garters on your upper legs.  Exercise your legs. This can help the swelling go down.  Wear elastic bandages or support stockings as told by your doctor.  Eat a low-salt (low-sodium) diet to reduce fluid as told by your doctor.  Depending on the cause of your swelling, you may need to limit how much fluid you drink (fluid restriction).  Take over-the-counter and prescription medicines only as told by your doctor. Contact a doctor if:  Treatment is not working.  You have heart, liver, or kidney disease and have symptoms of edema.  You have sudden and unexplained weight gain. Get help right away if:  You have shortness of breath or chest pain.  You cannot breathe when you lie down.  You have pain, redness, or warmth in the swollen areas.  You have heart, liver, or kidney disease and get edema all of a sudden.  You have a fever and your symptoms get worse all of a sudden. Summary  Edema is when you have too much fluid in your body or under your skin.  Edema may make your legs, feet, and ankles swell up. Swelling is also common in looser tissues, like around your eyes.  Raise (elevate) the swollen body part above the level of your  heart when you are sitting or lying down.  Follow your doctor's instructions about diet and how much fluid you can drink (fluid restriction). This information is not intended to replace advice given to you by your health care provider. Make sure you discuss any questions you have with your health care provider. Document Released: 10/31/2007 Document Revised: 06/01/2016 Document Reviewed: 06/01/2016 Elsevier Interactive Patient Education  2017 Elsevier Inc.  Fetal Movement Counts Patient Name: ________________________________________________ Patient Due Date: ____________________ What is a fetal movement count? A fetal movement count is the number of times that you feel your baby move during a certain amount of time. This may also be called a fetal kick count. A fetal movement count is recommended for every pregnant woman. You may be asked to start counting fetal movements as early as week 28 of your pregnancy. Pay attention to when your baby is most active. You may notice your baby's sleep and wake cycles. You may also notice things that make your baby move more. You should do a fetal movement count:  When your baby is normally most active.  At the same time each day.  A good time to count movements is while you are resting, after having something to eat and drink. How do I count fetal movements? 1. Find a quiet, comfortable area. Sit, or lie down on your side. 2. Write down the  date, the start time and stop time, and the number of movements that you felt between those two times. Take this information with you to your health care visits. 3. For 2 hours, count kicks, flutters, swishes, rolls, and jabs. You should feel at least 10 movements during 2 hours. 4. You may stop counting after you have felt 10 movements. 5. If you do not feel 10 movements in 2 hours, have something to eat and drink. Then, keep resting and counting for 1 hour. If you feel at least 4 movements during that hour, you may  stop counting. Contact a health care provider if:  You feel fewer than 4 movements in 2 hours.  Your baby is not moving like he or she usually does. Date: ____________ Start time: ____________ Stop time: ____________ Movements: ____________ Date: ____________ Start time: ____________ Stop time: ____________ Movements: ____________ Date: ____________ Start time: ____________ Stop time: ____________ Movements: ____________ Date: ____________ Start time: ____________ Stop time: ____________ Movements: ____________ Date: ____________ Start time: ____________ Stop time: ____________ Movements: ____________ Date: ____________ Start time: ____________ Stop time: ____________ Movements: ____________ Date: ____________ Start time: ____________ Stop time: ____________ Movements: ____________ Date: ____________ Start time: ____________ Stop time: ____________ Movements: ____________ Date: ____________ Start time: ____________ Stop time: ____________ Movements: ____________ This information is not intended to replace advice given to you by your health care provider. Make sure you discuss any questions you have with your health care provider. Document Released: 06/13/2006 Document Revised: 01/11/2016 Document Reviewed: 06/23/2015 Elsevier Interactive Patient Education  2018 [employer].  [employer] of the uterus can occur throughout pregnancy, but they are not always a sign that you are in labor. You may have practice contractions called Braxton Hicks contractions. These false labor contractions are sometimes confused with true labor. What are Deberah Pelton contractions? Braxton Hicks contractions are tightening movements that occur in the muscles of the uterus before labor. Unlike true labor contractions, these contractions do not result in opening (dilation) and thinning of the cervix. Toward the end of pregnancy (32-34 weeks), Braxton Hicks contractions can happen more  often and may become stronger. These contractions are sometimes difficult to tell apart from true labor because they can be very uncomfortable. You should not feel embarrassed if you go to the hospital with false labor. Sometimes, the only way to tell if you are in true labor is for your health care provider to look for changes in the cervix. The health care provider will do a physical exam and may monitor your contractions. If you are not in true labor, the exam should show that your cervix is not dilating and your water has not broken. If there are other health problems associated with your pregnancy, it is completely safe for you to be sent home with false labor. You may continue to have Braxton Hicks contractions until you go into true labor. How to tell the difference between true labor and false labor True labor  Contractions last 30-70 seconds.  Contractions become very regular.  Discomfort is usually felt in the top of the uterus, and it spreads to the lower abdomen and low back.  Contractions do not go away with walking.  Contractions usually become more intense and increase in frequency.  The cervix dilates and gets thinner. False labor  Contractions are usually shorter and not as strong as true labor contractions.  Contractions are usually irregular.  Contractions are often felt in the front of the lower abdomen and in the groin.  Contractions may go away when you walk around or change positions while lying down.  Contractions get weaker and are shorter-lasting as time goes on.  The cervix usually does not dilate or become thin. Follow these instructions at home:  Take over-the-counter and prescription medicines only as told by your health care provider.  Keep up with your usual exercises and follow other instructions from your health care provider.  Eat and drink lightly if you think you are going into labor.  If Braxton Hicks contractions are making you  uncomfortable: ? Change your position from lying down or resting to walking, or change from walking to resting. ? Sit and rest in a tub of warm water. ? Drink enough fluid to keep your urine pale yellow. Dehydration may cause these contractions. ? Do slow and deep breathing several times an hour.  Keep all follow-up prenatal visits as told by your health care provider. This is important. Contact a health care provider if:  You have a fever.  You have continuous pain in your abdomen. Get help right away if:  Your contractions become stronger, more regular, and closer together.  You have fluid leaking or gushing from your vagina.  You pass blood-tinged mucus (bloody show).  You have bleeding from your vagina.  You have low back pain that you never had before.  You feel your babys head pushing down and causing pelvic pressure.  Your baby is not moving inside you as much as it used to. Summary  Contractions that occur before labor are called Braxton Hicks contractions, false labor, or practice contractions.  Braxton Hicks contractions are usually shorter, weaker, farther apart, and less regular than true labor contractions. True labor contractions usually become progressively stronger and regular and they become more frequent.  Manage discomfort from [facility] contractions by changing position, resting in a warm bath, drinking plenty of water, or practicing deep breathing. This information is not intended to replace advice given to you by your health care provider. Make sure you discuss any questions you have with your health care provider. Document Released: 09/27/2016 Document Revised: 09/27/2016 Document Reviewed: 09/27/2016 Elsevier Interactive Patient Education  2018 [employer].

## 2017-09-27 NOTE — MAU Note (Signed)
PT  SAYS SHE GETS PNC  IN CLINIC.   NOTICED FEET SWELLING  TODAY  AFTER SHE GOT HAIR  FIXED.

## 2017-09-30 ENCOUNTER — Encounter: Payer: Self-pay | Admitting: Obstetrics & Gynecology

## 2017-09-30 NOTE — Telephone Encounter (Signed)
Pt concerns addressed in MAU on 09/27/17.  

## 2017-10-01 ENCOUNTER — Other Ambulatory Visit: Payer: Self-pay

## 2017-10-01 MED ORDER — GLUCOSE BLOOD VI STRP: ORAL_STRIP | 12 refills | Status: AC

## 2017-10-03 ENCOUNTER — Ambulatory Visit (INDEPENDENT_AMBULATORY_CARE_PROVIDER_SITE_OTHER): Payer: Medicaid Other | Admitting: Obstetrics & Gynecology

## 2017-10-03 ENCOUNTER — Encounter: Payer: Self-pay | Admitting: *Deleted

## 2017-10-03 ENCOUNTER — Ambulatory Visit (INDEPENDENT_AMBULATORY_CARE_PROVIDER_SITE_OTHER): Payer: Medicaid Other | Admitting: *Deleted

## 2017-10-03 ENCOUNTER — Encounter: Payer: Self-pay | Admitting: Obstetrics & Gynecology

## 2017-10-03 ENCOUNTER — Ambulatory Visit: Payer: Self-pay

## 2017-10-03 VITALS — BP 130/78 | HR 95 | Wt 278.8 lb

## 2017-10-03 DIAGNOSIS — O0990 Supervision of high risk pregnancy, unspecified, unspecified trimester: Secondary | ICD-10-CM

## 2017-10-03 DIAGNOSIS — O2343 Unspecified infection of urinary tract in pregnancy, third trimester: Secondary | ICD-10-CM

## 2017-10-03 DIAGNOSIS — B951 Streptococcus, group B, as the cause of diseases classified elsewhere: Secondary | ICD-10-CM

## 2017-10-03 DIAGNOSIS — E1165 Type 2 diabetes mellitus with hyperglycemia: Secondary | ICD-10-CM

## 2017-10-03 DIAGNOSIS — O24113 Pre-existing diabetes mellitus, type 2, in pregnancy, third trimester: Secondary | ICD-10-CM

## 2017-10-03 LAB — POCT URINALYSIS DIP (DEVICE)
Bilirubin Urine: NEGATIVE
Glucose, UA: NEGATIVE mg/dL
HGB URINE DIPSTICK: NEGATIVE
Ketones, ur: NEGATIVE mg/dL
Nitrite: NEGATIVE
PH: 7 (ref 5.0–8.0)
PROTEIN: NEGATIVE mg/dL
Specific Gravity, Urine: 1.01 (ref 1.005–1.030)
UROBILINOGEN UA: 0.2 mg/dL (ref 0.0–1.0)

## 2017-10-03 NOTE — Progress Notes (Signed)
   PRENATAL VISIT NOTE  Subjective:  Samantha Bowers is a 22 y.o. G1P0000 at [redacted]w[redacted]d being seen today for ongoing prenatal care.  She is currently monitored for the following issues for this high-risk pregnancy and has GBS (group B streptococcus) UTI complicating pregnancy; Pre-existing type 2 diabetes mellitus with hyperglycemia during pregnancy in third trimester (HCC); Chlamydia infection complicating pregnancy; Supervision of high risk pregnancy, antepartum; and Poor glycemic control on their problem list.  Patient reports no complaints.  Contractions: Irregular. Vag. Bleeding: None.  Movement: Present. Denies leaking of fluid.   The following portions of the patient's history were reviewed and updated as appropriate: allergies, current medications, past family history, past medical history, past social history, past surgical history and problem list. Problem list updated.  Objective:   Vitals:   10/03/17 0938  BP: 130/78  Pulse: 95  Weight: 278 lb 12.8 oz (126.5 kg)    Fetal Status: Fetal Heart Rate (bpm): NST   Movement: Present     General:  Alert, oriented and cooperative. Patient is in no acute distress.  Skin: Skin is warm and dry. No rash noted.   Cardiovascular: Normal heart rate noted  Respiratory: Normal respiratory effort, no problems with respiration noted  Abdomen: Soft, gravid, appropriate for gestational age.  Pain/Pressure: Present     Pelvic: Cervical exam performed        Extremities: Normal range of motion.     Mental Status: Normal mood and affect. Normal behavior. Normal judgment and thought content.   Assessment and Plan:  Pregnancy: G1P0000 at [redacted]w[redacted]d  1. Supervision of high risk pregnancy, antepartum   2. Pre-existing type 2 diabetes mellitus with hyperglycemia during pregnancy in third trimester (HCC) -IOL for 39 weeks scheduled. I entered orders for IOL - her sugars that she brought with her (not checking for the last 3 days as she was out of strips)  range from 58-203. There is not concensus when the highs and lows are occurring so I cannot change her insulin dose  3. Group B Streptococcus urinary tract infection affecting pregnancy in third trimester - treat in labor  Term labor symptoms and general obstetric precautions including but not limited to vaginal bleeding, contractions, leaking of fluid and fetal movement were reviewed in detail with the patient. Please refer to After Visit Summary for other counseling recommendations.  Return in about 5 weeks (around 11/07/2017) for postpartum visit.  Future Appointments  Date Time Provider Department Center  10/10/2017  9:15 AM [facility] NST [facility]  10/10/2017 10:15 AM Allie Bossier, MD [facility]    Allie Bossier, MD

## 2017-10-03 NOTE — Progress Notes (Signed)

## 2017-10-03 NOTE — Progress Notes (Signed)
I have reviewed this chart and agree with the RN/CMA assessment and management.    Manila Rommel C Tyshell Ramberg, MD, FACOG Attending Physician, [facility]  

## 2017-10-03 NOTE — Progress Notes (Signed)
Pt requests Cx exam today.

## 2017-10-04 ENCOUNTER — Other Ambulatory Visit: Payer: Self-pay | Admitting: Advanced Practice Midwife

## 2017-10-07 ENCOUNTER — Telehealth (HOSPITAL_COMMUNITY): Payer: Self-pay | Admitting: *Deleted

## 2017-10-07 ENCOUNTER — Encounter (HOSPITAL_COMMUNITY): Payer: Self-pay | Admitting: *Deleted

## 2017-10-07 NOTE — Telephone Encounter (Signed)
Preadmission screen  

## 2017-10-10 ENCOUNTER — Other Ambulatory Visit: Payer: Self-pay

## 2017-10-10 ENCOUNTER — Encounter (HOSPITAL_COMMUNITY): Payer: Self-pay

## 2017-10-10 ENCOUNTER — Encounter: Payer: Medicaid Other | Admitting: Obstetrics & Gynecology

## 2017-10-10 ENCOUNTER — Inpatient Hospital Stay (HOSPITAL_COMMUNITY): Payer: Medicaid Other | Admitting: Anesthesiology

## 2017-10-10 ENCOUNTER — Inpatient Hospital Stay (HOSPITAL_COMMUNITY)
Admission: RE | Admit: 2017-10-10 | Discharge: 2017-10-12 | DRG: 807 | Disposition: A | Discharge status: Home / Self Care | Payer: Medicaid Other | Source: Ambulatory Visit | Attending: Obstetrics and Gynecology | Admitting: Obstetrics and Gynecology

## 2017-10-10 ENCOUNTER — Other Ambulatory Visit: Payer: Medicaid Other

## 2017-10-10 DIAGNOSIS — E1165 Type 2 diabetes mellitus with hyperglycemia: Secondary | ICD-10-CM

## 2017-10-10 DIAGNOSIS — O2412 Pre-existing diabetes mellitus, type 2, in childbirth: Principal | ICD-10-CM | POA: Diagnosis present

## 2017-10-10 DIAGNOSIS — O99824 Streptococcus B carrier state complicating childbirth: Secondary | ICD-10-CM | POA: Diagnosis not present

## 2017-10-10 DIAGNOSIS — A749 Chlamydial infection, unspecified: Secondary | ICD-10-CM

## 2017-10-10 DIAGNOSIS — Z3A39 39 weeks gestation of pregnancy: Secondary | ICD-10-CM | POA: Diagnosis not present

## 2017-10-10 DIAGNOSIS — E119 Type 2 diabetes mellitus without complications: Secondary | ICD-10-CM | POA: Diagnosis present

## 2017-10-10 DIAGNOSIS — Z794 Long term (current) use of insulin: Secondary | ICD-10-CM | POA: Diagnosis not present

## 2017-10-10 DIAGNOSIS — O2343 Unspecified infection of urinary tract in pregnancy, third trimester: Secondary | ICD-10-CM

## 2017-10-10 DIAGNOSIS — O24113 Pre-existing diabetes mellitus, type 2, in pregnancy, third trimester: Secondary | ICD-10-CM

## 2017-10-10 DIAGNOSIS — O24119 Pre-existing diabetes mellitus, type 2, in pregnancy, unspecified trimester: Secondary | ICD-10-CM | POA: Diagnosis present

## 2017-10-10 DIAGNOSIS — R7309 Other abnormal glucose: Secondary | ICD-10-CM | POA: Diagnosis present

## 2017-10-10 DIAGNOSIS — O98819 Other maternal infectious and parasitic diseases complicating pregnancy, unspecified trimester: Secondary | ICD-10-CM

## 2017-10-10 DIAGNOSIS — O0990 Supervision of high risk pregnancy, unspecified, unspecified trimester: Secondary | ICD-10-CM

## 2017-10-10 DIAGNOSIS — B951 Streptococcus, group B, as the cause of diseases classified elsewhere: Secondary | ICD-10-CM

## 2017-10-10 DIAGNOSIS — O2340 Unspecified infection of urinary tract in pregnancy, unspecified trimester: Secondary | ICD-10-CM

## 2017-10-10 LAB — CBC
HCT: 35.7 % — ABNORMAL LOW (ref 36.0–46.0)
HCT: 37.2 % (ref 36.0–46.0)
HEMOGLOBIN: 12.1 g/dL (ref 12.0–15.0)
HEMOGLOBIN: 12.4 g/dL (ref 12.0–15.0)
MCH: 30.1 pg (ref 26.0–34.0)
MCH: 30.1 pg (ref 26.0–34.0)
MCHC: 33.9 g/dL (ref 30.0–36.0)
MCHC: 33.3 g/dL (ref 30.0–36.0)
MCV: 88.8 fL (ref 78.0–100.0)
MCV: 90.3 fL (ref 78.0–100.0)
PLATELETS: 169 10*3/uL (ref 150–400)
Platelets: 171 10*3/uL (ref 150–400)
RBC: 4.02 MIL/uL (ref 3.87–5.11)
RBC: 4.12 MIL/uL (ref 3.87–5.11)
RDW: 13.3 % (ref 11.5–15.5)
RDW: 13.5 % (ref 11.5–15.5)
WBC: 8.1 10*3/uL (ref 4.0–10.5)
WBC: 14.4 10*3/uL — ABNORMAL HIGH (ref 4.0–10.5)

## 2017-10-10 LAB — GLUCOSE, CAPILLARY
GLUCOSE-CAPILLARY: 109 mg/dL — AB (ref 65–99)
Glucose-Capillary: 149 mg/dL — ABNORMAL HIGH (ref 65–99)
Glucose-Capillary: 78 mg/dL (ref 65–99)
Glucose-Capillary: 100 mg/dL — ABNORMAL HIGH (ref 65–99)
Glucose-Capillary: 60 mg/dL — ABNORMAL LOW (ref 65–99)

## 2017-10-10 LAB — TYPE AND SCREEN
ABO/RH(D): A POS
Antibody Screen: NEGATIVE

## 2017-10-10 LAB — RPR: RPR Ser Ql: NONREACTIVE

## 2017-10-10 MED ORDER — LACTATED RINGERS IV SOLN
500.0000 mL | INTRAVENOUS | Status: DC | PRN
  Administered 2017-10-10: 500 mL via INTRAVENOUS

## 2017-10-10 MED ORDER — MISOPROSTOL 200 MCG PO TABS: 800.0000 ug | ORAL_TABLET | Freq: Once | ORAL | Status: DC

## 2017-10-10 MED ORDER — FENTANYL 2.5 MCG/ML BUPIVACAINE 1/10 % EPIDURAL INFUSION (WH - ANES)
INTRAMUSCULAR | Status: AC
  Filled 2017-10-10: qty 100

## 2017-10-10 MED ORDER — PRENATAL MULTIVITAMIN CH
1.0000 | ORAL_TABLET | Freq: Every day | ORAL | Status: DC
  Administered 2017-10-11 – 2017-10-12 (×2): 1 via ORAL
  Filled 2017-10-10 (×2): qty 1

## 2017-10-10 MED ORDER — EPHEDRINE 5 MG/ML INJ
10.0000 mg | INTRAVENOUS | Status: DC | PRN
  Filled 2017-10-10: qty 2

## 2017-10-10 MED ORDER — WITCH HAZEL-GLYCERIN EX PADS: 1.0000 "application " | MEDICATED_PAD | CUTANEOUS | Status: DC | PRN

## 2017-10-10 MED ORDER — PENICILLIN G POT IN DEXTROSE 60000 UNIT/ML IV SOLN
3.0000 10*6.[IU] | INTRAVENOUS | Status: DC
  Administered 2017-10-10 (×2): 3 10*6.[IU] via INTRAVENOUS
  Filled 2017-10-10 (×5): qty 50

## 2017-10-10 MED ORDER — MISOPROSTOL 200 MCG PO TABS
400.0000 ug | ORAL_TABLET | Freq: Once | ORAL | Status: AC
  Administered 2017-10-10: 400 ug via BUCCAL

## 2017-10-10 MED ORDER — PENICILLIN G POTASSIUM 5000000 UNITS IJ SOLR
5.0000 10*6.[IU] | Freq: Once | INTRAMUSCULAR | Status: AC
  Administered 2017-10-10: 5 10*6.[IU] via INTRAVENOUS
  Filled 2017-10-10: qty 5

## 2017-10-10 MED ORDER — IBUPROFEN 600 MG PO TABS
600.0000 mg | ORAL_TABLET | Freq: Four times a day (QID) | ORAL | Status: DC
  Administered 2017-10-10 – 2017-10-12 (×7): 600 mg via ORAL
  Filled 2017-10-10 (×7): qty 1

## 2017-10-10 MED ORDER — OXYTOCIN 40 UNITS IN LACTATED RINGERS INFUSION - SIMPLE MED
1.0000 m[IU]/min | INTRAVENOUS | Status: DC
  Administered 2017-10-10: 2 m[IU]/min via INTRAVENOUS
  Filled 2017-10-10: qty 1000

## 2017-10-10 MED ORDER — EPHEDRINE 5 MG/ML INJ
INTRAVENOUS | Status: AC
  Filled 2017-10-10: qty 4

## 2017-10-10 MED ORDER — OXYTOCIN BOLUS FROM INFUSION
500.0000 mL | Freq: Once | INTRAVENOUS | Status: AC
  Administered 2017-10-10: 500 mL via INTRAVENOUS

## 2017-10-10 MED ORDER — TETANUS-DIPHTH-ACELL PERTUSSIS 5-2.5-18.5 LF-MCG/0.5 IM SUSP: 0.5000 mL | Freq: Once | INTRAMUSCULAR | Status: DC

## 2017-10-10 MED ORDER — PHENYLEPHRINE 40 MCG/ML (10ML) SYRINGE FOR IV PUSH (FOR BLOOD PRESSURE SUPPORT)
80.0000 ug | PREFILLED_SYRINGE | INTRAVENOUS | Status: DC | PRN
  Filled 2017-10-10: qty 5

## 2017-10-10 MED ORDER — MISOPROSTOL 50MCG HALF TABLET
50.0000 ug | ORAL_TABLET | ORAL | Status: DC | PRN
  Filled 2017-10-10 (×2): qty 1

## 2017-10-10 MED ORDER — METFORMIN HCL 500 MG PO TABS
500.0000 mg | ORAL_TABLET | Freq: Two times a day (BID) | ORAL | Status: DC
  Administered 2017-10-11 – 2017-10-12 (×3): 500 mg via ORAL
  Filled 2017-10-10 (×3): qty 1

## 2017-10-10 MED ORDER — TERBUTALINE SULFATE 1 MG/ML IJ SOLN
0.2500 mg | Freq: Once | INTRAMUSCULAR | Status: DC | PRN
  Filled 2017-10-10: qty 1

## 2017-10-10 MED ORDER — LIDOCAINE HCL (PF) 1 % IJ SOLN
30.0000 mL | INTRAMUSCULAR | Status: AC | PRN
  Administered 2017-10-10: 30 mL via SUBCUTANEOUS
  Filled 2017-10-10: qty 30

## 2017-10-10 MED ORDER — FENTANYL 2.5 MCG/ML BUPIVACAINE 1/10 % EPIDURAL INFUSION (WH - ANES)
14.0000 mL/h | INTRAMUSCULAR | Status: DC | PRN
  Administered 2017-10-10: 14 mL/h via EPIDURAL

## 2017-10-10 MED ORDER — METHYLERGONOVINE MALEATE 0.2 MG/ML IJ SOLN
0.2000 mg | Freq: Once | INTRAMUSCULAR | Status: AC
  Administered 2017-10-10: 0.2 mg via INTRAMUSCULAR

## 2017-10-10 MED ORDER — TRANEXAMIC ACID 1000 MG/10ML IV SOLN
1000.0000 mg | Freq: Once | INTRAVENOUS | Status: AC
  Administered 2017-10-10: 1000 mg via INTRAVENOUS
  Filled 2017-10-10: qty 1100

## 2017-10-10 MED ORDER — METHYLERGONOVINE MALEATE 0.2 MG/ML IJ SOLN
INTRAMUSCULAR | Status: AC
  Filled 2017-10-10: qty 1

## 2017-10-10 MED ORDER — ZOLPIDEM TARTRATE 5 MG PO TABS: 5.0000 mg | ORAL_TABLET | Freq: Every evening | ORAL | Status: DC | PRN

## 2017-10-10 MED ORDER — EPHEDRINE 5 MG/ML INJ: 10.0000 mg | INTRAVENOUS | Status: DC | PRN

## 2017-10-10 MED ORDER — LIDOCAINE HCL (PF) 1 % IJ SOLN
INTRAMUSCULAR | Status: DC | PRN
  Administered 2017-10-10: 5 mL via EPIDURAL

## 2017-10-10 MED ORDER — LACTATED RINGERS IV SOLN
500.0000 mL | Freq: Once | INTRAVENOUS | Status: AC
  Administered 2017-10-10: 500 mL via INTRAVENOUS

## 2017-10-10 MED ORDER — ONDANSETRON HCL 4 MG PO TABS: 4.0000 mg | ORAL_TABLET | ORAL | Status: DC | PRN

## 2017-10-10 MED ORDER — LACTATED RINGERS IV SOLN
INTRAVENOUS | Status: DC
  Administered 2017-10-10 (×2): via INTRAVENOUS

## 2017-10-10 MED ORDER — MISOPROSTOL 200 MCG PO TABS
ORAL_TABLET | ORAL | Status: AC
  Filled 2017-10-10: qty 4

## 2017-10-10 MED ORDER — BENZOCAINE-MENTHOL 20-0.5 % EX AERO
1.0000 "application " | INHALATION_SPRAY | CUTANEOUS | Status: DC | PRN
  Administered 2017-10-10 – 2017-10-12 (×2): 1 via TOPICAL
  Filled 2017-10-10 (×2): qty 56

## 2017-10-10 MED ORDER — OXYCODONE-ACETAMINOPHEN 5-325 MG PO TABS: 2.0000 | ORAL_TABLET | ORAL | Status: DC | PRN

## 2017-10-10 MED ORDER — COCONUT OIL OIL
1.0000 "application " | TOPICAL_OIL | Status: DC | PRN
  Administered 2017-10-12: 1 via TOPICAL
  Filled 2017-10-10: qty 120

## 2017-10-10 MED ORDER — DIPHENHYDRAMINE HCL 50 MG/ML IJ SOLN: 12.5000 mg | INTRAMUSCULAR | Status: DC | PRN

## 2017-10-10 MED ORDER — OXYTOCIN 40 UNITS IN LACTATED RINGERS INFUSION - SIMPLE MED
2.5000 [IU]/h | INTRAVENOUS | Status: DC
  Administered 2017-10-10: 2.5 [IU]/h via INTRAVENOUS

## 2017-10-10 MED ORDER — OXYCODONE-ACETAMINOPHEN 5-325 MG PO TABS: 1.0000 | ORAL_TABLET | ORAL | Status: DC | PRN

## 2017-10-10 MED ORDER — DIPHENHYDRAMINE HCL 25 MG PO CAPS
25.0000 mg | ORAL_CAPSULE | Freq: Four times a day (QID) | ORAL | Status: DC | PRN
Start: 2017-10-10 — End: 2017-10-12

## 2017-10-10 MED ORDER — SIMETHICONE 80 MG PO CHEW: 80.0000 mg | CHEWABLE_TABLET | ORAL | Status: DC | PRN

## 2017-10-10 MED ORDER — ACETAMINOPHEN 325 MG PO TABS: 650.0000 mg | ORAL_TABLET | ORAL | Status: DC | PRN

## 2017-10-10 MED ORDER — MISOPROSTOL 200 MCG PO TABS
400.0000 ug | ORAL_TABLET | Freq: Once | ORAL | Status: AC
  Administered 2017-10-10: 400 ug via RECTAL

## 2017-10-10 MED ORDER — PHENYLEPHRINE 40 MCG/ML (10ML) SYRINGE FOR IV PUSH (FOR BLOOD PRESSURE SUPPORT): 80.0000 ug | PREFILLED_SYRINGE | INTRAVENOUS | Status: DC | PRN

## 2017-10-10 MED ORDER — SENNOSIDES-DOCUSATE SODIUM 8.6-50 MG PO TABS
2.0000 | ORAL_TABLET | ORAL | Status: DC
  Administered 2017-10-10 – 2017-10-11 (×2): 2 via ORAL
  Filled 2017-10-10 (×2): qty 2

## 2017-10-10 MED ORDER — DIBUCAINE 1 % RE OINT: 1.0000 "application " | TOPICAL_OINTMENT | RECTAL | Status: DC | PRN

## 2017-10-10 MED ORDER — ONDANSETRON HCL 4 MG/2ML IJ SOLN: 4.0000 mg | INTRAMUSCULAR | Status: DC | PRN

## 2017-10-10 MED ORDER — ACETAMINOPHEN 325 MG PO TABS
650.0000 mg | ORAL_TABLET | ORAL | Status: DC | PRN
  Administered 2017-10-11: 650 mg via ORAL
  Filled 2017-10-10: qty 2

## 2017-10-10 MED ORDER — LACTATED RINGERS IV SOLN: 500.0000 mL | Freq: Once | INTRAVENOUS | Status: DC

## 2017-10-10 MED ORDER — PHENYLEPHRINE 40 MCG/ML (10ML) SYRINGE FOR IV PUSH (FOR BLOOD PRESSURE SUPPORT)
PREFILLED_SYRINGE | INTRAVENOUS | Status: AC
  Filled 2017-10-10: qty 20

## 2017-10-10 MED ORDER — ONDANSETRON HCL 4 MG/2ML IJ SOLN
4.0000 mg | Freq: Four times a day (QID) | INTRAMUSCULAR | Status: DC | PRN
  Administered 2017-10-10: 4 mg via INTRAVENOUS
  Filled 2017-10-10: qty 2

## 2017-10-10 MED ORDER — SOD CITRATE-CITRIC ACID 500-334 MG/5ML PO SOLN: 30.0000 mL | ORAL | Status: DC | PRN

## 2017-10-10 MED ORDER — FENTANYL CITRATE (PF) 100 MCG/2ML IJ SOLN
100.0000 ug | INTRAMUSCULAR | Status: DC | PRN
  Administered 2017-10-10: 100 ug via INTRAVENOUS
  Filled 2017-10-10: qty 2

## 2017-10-10 NOTE — Progress Notes (Signed)
Pt unable to void on bedpan straight cath for 800 cc clear yellow urine. Peri care don with ne ice pack

## 2017-10-10 NOTE — Anesthesia Preprocedure Evaluation (Signed)
Anesthesia Evaluation  Patient identified by MRN, date of birth, ID band Patient awake    Reviewed: Allergy & Precautions, Patient's Chart, lab work & pertinent test results  Airway Mallampati: II       Dental no notable dental hx.    Pulmonary    Pulmonary exam normal        Cardiovascular negative cardio ROS Normal cardiovascular exam     Neuro/Psych    GI/Hepatic Neg liver ROS,   Endo/Other  diabetes, Type 2, Insulin Dependent, Oral Hypoglycemic Agents  Renal/GU      Musculoskeletal   Abdominal   Peds  Hematology negative hematology ROS (+)   Anesthesia Other Findings   Reproductive/Obstetrics (+) Pregnancy                             Anesthesia Physical Anesthesia Plan  ASA: III  Anesthesia Plan: Epidural   Post-op Pain Management:    Induction:   PONV Risk Score and Plan:   Airway Management Planned:   Additional Equipment:   Intra-op Plan:   Post-operative Plan:   Informed Consent: I have reviewed the patients History and Physical, chart, labs and discussed the procedure including the risks, benefits and alternatives for the proposed anesthesia with the patient or authorized representative who has indicated his/her understanding and acceptance.     Plan Discussed with:   Anesthesia Plan Comments: (Lab Results      Component                Value               Date                      WBC                      8.1                 10/10/2017                HGB                      12.1                10/10/2017                HCT                      35.7 (L)            10/10/2017                MCV                      88.8                10/10/2017                PLT                      171                 10/10/2017           )        Anesthesia Quick Evaluation

## 2017-10-10 NOTE — Anesthesia Pain Management Evaluation Note (Addendum)
  CRNA Pain Management Visit Note  Patient: Samantha Bowers, 22 y.o., female  "Hello I am a member of the anesthesia team at [facility]. We have an anesthesia team available at all times to provide care throughout the hospital, including epidural management and anesthesia for C-section. I don't know your plan for the delivery whether it a natural birth, water birth, IV sedation, nitrous supplementation, doula or epidural, but we want to meet your pain goals."   1.Was your pain managed to your expectations on prior hospitalizations?   No prior hospitalizations.  2.What is your expectation for pain management during this hospitalization?     Epidural  3.How can we help you reach that goal? Epidural @ pain goal.  Record the patient's initial score and the patient's pain goal.   Pain: 2  Pain Goal: 6 [facility] wants you to be able to say your pain was always managed very well.  [name] 10/10/2017

## 2017-10-10 NOTE — Anesthesia Procedure Notes (Signed)
Epidural Patient location during procedure: OB Start time: 10/10/2017 1:01 PM End time: 10/10/2017 1:07 PM  Staffing Anesthesiologist: Shelton Silvas, MD Performed: anesthesiologist   Preanesthetic Checklist Completed: patient identified, site marked, surgical consent, pre-op evaluation, timeout performed, IV checked, risks and benefits discussed and monitors and equipment checked  Epidural Patient position: sitting Prep: ChloraPrep Patient monitoring: heart rate, continuous pulse ox and blood pressure Approach: midline Location: L3-L4 Injection technique: LOR saline  Needle:  Needle type: Tuohy  Needle gauge: 17 G Needle length: 9 cm Catheter type: closed end flexible Catheter size: 20 Guage Test dose: negative and 1.5% lidocaine  Assessment Events: blood not aspirated, injection not painful, no injection resistance and no paresthesia  Additional Notes LOR @ 6.5  Patient identified. Risks/Benefits/Options discussed with patient including but not limited to bleeding, infection, nerve damage, paralysis, failed block, incomplete pain control, headache, blood pressure changes, nausea, vomiting, reactions to medications, itching and postpartum back pain. Confirmed with bedside nurse the patient's most recent platelet count. Confirmed with patient that they are not currently taking any anticoagulation, have any bleeding history or any family history of bleeding disorders. Patient expressed understanding and wished to proceed. All questions were answered. Sterile technique was used throughout the entire procedure. Please see nursing notes for vital signs. Test dose was given through epidural catheter and negative prior to continuing to dose epidural or start infusion. Warning signs of high block given to the patient including shortness of breath, tingling/numbness in hands, complete motor block, or any concerning symptoms with instructions to call for help. Patient was given instructions  on fall risk and not to get out of bed. All questions and concerns addressed with instructions to call with any issues or inadequate analgesia.    Reason for block:procedure for pain

## 2017-10-10 NOTE — Progress Notes (Signed)
Pt in wc with assist of stedy.  Pt baby family and all of her belongings to rm 62

## 2017-10-10 NOTE — Progress Notes (Signed)
LABOR PROGRESS NOTE  Samantha Bowers is a 22 y.o. G1P0000 at [redacted]w[redacted]d  admitted for IOL for T2DM  Subjective: Patient doing well. Comfortable with epidural  Objective: BP (!) 133/97   Pulse 72   Temp 98.4 F (36.9 C) (Axillary)   Resp 16   Ht 6' (1.829 m)   Wt 280 lb 1.6 oz (127.1 kg)   LMP 01/10/2017 (Exact Date)   BMI 37.99 kg/m  or  Vitals:   10/10/17 1356 10/10/17 1400 10/10/17 1406 10/10/17 1430  BP: 130/79 (!) 142/92 118/77 (!) 133/97  Pulse:  88 96 72  Resp:      Temp:      TempSrc:      Weight:      Height:        Last SVE Dilation: 7 Effacement (%): 100 Cervical Position: Middle Station: -1, 0 Presentation: Vertex Exam by:: Milus Glazier RN FHT: baseline rate 140, moderate varibility, +acel, early decel Toco: ctx q 1-3 min  CBG (last 3)  Recent Labs    10/10/17 0730 10/10/17 1014 10/10/17 1323  GLUCAP 149* 78 100*    Assessment / Plan: 22 y.o. G1P0000 at [redacted]w[redacted]d here for IOL for T2DM  Labor: progressing well on Pitocin Fetal Wellbeing:  Cat I  Pain Control:  epidural Anticipated MOD:  SVD T2DM: CBGs now at goal  Frederik Pear, MD 10/10/2017, 3:02 PM

## 2017-10-10 NOTE — H&P (Addendum)
LABOR AND DELIVERY HISTORY AND PHYSICAL  Samantha Bowers is a 22 y.o. female G1P0000 with IUP at [redacted]w[redacted]d by 8-WK u/s presenting for IOL for T2DM. She reports positive fetal movement. She denies leakage of fluid or vaginal bleeding.  Prenatal History/Complications: PNC at [facility] Pregnancy complications:  - T2DM (on metformin and insulin); poor glycemic control - GBS UTI - Chlamydia  OB History    Gravida  1   Para  0   Term  0   Preterm  0   AB  0   Living  0     SAB  0   TAB  0   Ectopic  0   Multiple  0   Live Births  0          Past Medical History:  Diagnosis Date  . Diabetes mellitus without complication [facility])    Past Surgical History:  Procedure Laterality Date  . NO PAST SURGERIES     Family History: family history includes Diabetes in her father and mother. Social History:  reports that she has never smoked. She has never used smokeless tobacco. She reports that she does not drink alcohol or use drugs.     Maternal Diabetes: Yes:  Diabetes Type:  Insulin/Medication controlled Genetic Screening: Normal Maternal Ultrasounds/Referrals: Normal Fetal Ultrasounds or other Referrals:  None Maternal Substance Abuse:  No Significant Maternal Medications:  Meds include: Other: Insulin, Metformin Significant Maternal Lab Results:  None Other Comments:  None  Review of Systems  Constitutional: Negative.   Genitourinary: Negative.    Maternal Medical History:  Fetal activity: Perceived fetal activity is normal.   Last perceived fetal movement was within the past hour.    Prenatal complications: no prenatal complications Prenatal Complications - Diabetes: type 2. Diabetes is managed by insulin injections.        Height 6' (1.829 m), weight 127.1 kg (280 lb 1.6 oz), last menstrual period 01/10/2017. Maternal Exam:  Uterine Assessment: Contraction strength is mild.  Contraction frequency is rare.   Abdomen: Patient reports no abdominal tenderness.  Fetal presentation: vertex     Physical Exam  Constitutional: She appears well-developed and well-nourished. No distress.  HENT:  Head: Normocephalic and atraumatic.  Eyes: EOM are normal.  Respiratory: Effort normal.  Neurological: She is alert.  Skin: She is not diaphoretic.  Psychiatric: She has a normal mood and affect. Her behavior is normal.    Prenatal labs: ABO, Rh: --/--/A POS, A POS Performed at [facility], [address] [place], [place] [address]  [phone]) Antibody: NEG (04/25 1722) Rubella: Immune (11/01 0000) RPR: Non Reactive (02/21 1134)  HBsAg: Negative (11/01 0000)  HIV: Non Reactive (02/21 1134)  GBS:   Positive  Assessment/Plan: 22yo G1P0000 at 39wk being admitted for IOL for type II DM  Labor - Cytotec for cervical ripening, will place FB when able Pain control- Epidural Contraception - Depo ID- GBS positive, will start Penicillin Baby plan- girl, breastfeed   Felisa Bonier, MD, PGY-1 Family Medicine - [facility] 10/10/2017, 7:21 AM  OB FELLOW HISTORY AND PHYSICAL ATTESTATION  I have seen and examined this patient; I agree with above documentation in the resident's note.   IOL for T2DM (has had poor control on metformin and insulin). Last growth scan at [redacted]w[redacted]d, 69%ile (5lb15oz), - Cervix favorable, start IV Pitocin - GBS positive, start PCN - FHT Cat II FHT - T2DM: initial CBG 149, above goal. Will recheck soon, and if still elevated, will start  IV insulin gtt  Frederik Pear, MD OB Fellow 10/10/2017, 9:16 AM

## 2017-10-10 NOTE — Lactation Note (Signed)
This note was copied from a baby's chart. Lactation Consultation Note  Patient Name: Samantha Bowers [id number] Date: 10/10/2017 Reason for consult: Initial assessment;1st time breastfeeding;Primapara;Term  P1 mother whose infant is now 44 hours old.  Mother is doing STS and baby fed 2 hours ago.  Encouraged STS, breast massage and hand expression, feeding 8-12 times/24 hours or earlier if baby shows cues.  Reviewed feeding cues with mother.  Mother has no questions/concerns now but will call for latch assistance as needed. Mom made aware of O/P services, breastfeeding support groups, community resources, and our phone # for post-discharge questions.    Maternal Data Formula Feeding for Exclusion: No Has patient been taught Hand Expression?: Yes Does the patient have breastfeeding experience prior to this delivery?: No  Feeding Feeding Type: Breast Fed Length of feed: 5 min  LATCH Score                   Interventions    Lactation Tools Discussed/Used     Consult Status Consult Status: Follow-up Date: 10/11/17 Follow-up type: In-patient    Dora Sims 10/10/2017, 9:38 PM

## 2017-10-11 LAB — GLUCOSE, CAPILLARY
GLUCOSE-CAPILLARY: 124 mg/dL — AB (ref 65–99)
Glucose-Capillary: 128 mg/dL — ABNORMAL HIGH (ref 65–99)
Glucose-Capillary: 179 mg/dL — ABNORMAL HIGH (ref 65–99)
Glucose-Capillary: 193 mg/dL — ABNORMAL HIGH (ref 65–99)

## 2017-10-11 NOTE — Lactation Note (Signed)
This note was copied from a baby's chart. Lactation Consultation Note  Patient Name: Samantha Bowers [id number] Date: 10/11/2017 Reason for consult: Follow-up assessment;Term  RN called; mom requested latch assistance. She's already pumping and on NS # 20. Mom had baby swaddled and in her clothes when entering the room, recommended mom doing STS instead, she graciously agreed. LC took baby to the right breast on football position using a NS # 20. Mom was able to hand express a drop of colostrum prior that, and baby was able to latch on after a few tries. LC had to do breast compressions nearly throughout the feeding in order to keep baby interested at the breast, otherwise she'd come off. Baby still nursing when exiting the room.  RN came into the room during [facility] consult to check baby's skin bilirubin and she mentioned in was on the upper end, still WNL. Explained to mom that if baby were to need supplementation mother's milk will always be our first option. Discussed the importance of consistent pumping; mom verbalized understanding.  Encouraged mom to feed baby STS 8-12 times/24 hours or sooner if feeding cues are present. If baby not cueing within a 3 hour period, mom will give her an opportunity to feed placing her STS at the breast. Mom will call for lactation help PRN, but aware that there is only one LC on this shift.   Maternal Data    Feeding Feeding Type: Breast Fed Length of feed: 7 min  LATCH Score Latch: Repeated attempts needed to sustain latch, nipple held in mouth throughout feeding, stimulation needed to elicit sucking reflex.  Audible Swallowing: A few with stimulation  Type of Nipple: Everted at rest and after stimulation  Comfort (Breast/Nipple): Soft / non-tender  Hold (Positioning): Assistance needed to correctly position infant at breast and maintain latch.  LATCH Score: 7  Interventions Interventions: Breast feeding basics reviewed;Assisted with latch;Skin to  skin;Breast massage;Hand express;Breast compression;Adjust position;Support pillows;Position options;Shells;DEBP  Lactation Tools Discussed/Used Tools: Shells;Nipple Shields Nipple shield size: 20 Shell Type: Inverted Pump Review: Setup, frequency, and cleaning Initiated by:: RN Date initiated:: 10/11/17   Consult Status Consult Status: Follow-up Date: 10/12/17 Follow-up type: In-patient    Sesilia Poucher Venetia Constable 10/11/2017, 6:10 PM

## 2017-10-11 NOTE — Lactation Note (Signed)
This note was copied from a baby's chart. Lactation Consultation Note  Patient Name: Samantha Bowers [name] Date: 10/11/2017 Reason for consult: Initial assessment;1st time breastfeeding;Primapara;Term  LC Follow Up: RN Request  Mother holding infant in arms as I arrived.  Assisted infant to latch using a #24 NS in the football hold on the left breast.  Instructed mother in applying NS and breast massage during feeds.  Infant with flanged lips and good rhythmic sucking.  Observed her feed for 20 minutes.  When she released their was colostrum in the NS.  Mother and grandmother very happy to see infant feed.  Baby quiet and sleepy after feeding.  Mother will call as needed for latch assistance.  RN updated.   Consult Status Consult Status: Follow-up Date: 10/11/17 Follow-up type: In-patient    [name] Hence R Faatima Tench 10/11/2017, 1:30 AM

## 2017-10-11 NOTE — Progress Notes (Signed)
Post Partum Day 1 Subjective: no complaints, up ad lib, voiding, tolerating PO and + flatus  Objective: Blood pressure 133/79, pulse 71, temperature 98.1 F (36.7 C), resp. rate 16, height 6' (1.829 m), weight 127.1 kg (280 lb 1.6 oz), last menstrual period 01/10/2017, SpO2 100 %, unknown if currently breastfeeding.  Physical Exam:  General: alert, cooperative, appears stated age and no distress Lochia: appropriate Uterine Fundus: firm DVT Evaluation: No evidence of DVT seen on physical exam. Negative Homan's sign. No cords or calf tenderness. Trace LE edema  Recent Labs    10/10/17 0725 10/10/17 2254  HGB 12.1 12.4  HCT 35.7* 37.2    Assessment/Plan: Plan for discharge tomorrow   LOS: 1 day   Samantha Bowers 10/11/2017, 7:25 AM

## 2017-10-11 NOTE — Lactation Note (Signed)
This note was copied from a baby's chart. Lactation Consultation Note  Patient Name: Samantha Bowers [id number] Date: 10/11/2017 Reason for consult: Follow-up assessment Mom called out for latch assist.  Baby unwrapped and place skin to skin in football hold.  Mom has been wearing shells.  Nipples everted.  Mom has been using a nipple shield.  Baby latched with a 20 mm nipple shield and fed for 10 minutes.  Shield removed and colostrum easily hand expressed.  Baby latched well without shield and fed an additional 15 minutes on opposite breast.  Symphony pump set up and initiated to stimulate milk supply.  Instructed to post pump 4-6 times per day and give any expressed milk back to baby.  Encouraged to call for assist/concerns.  Maternal Data    Feeding Feeding Type: Breast Fed Length of feed: 25 min  LATCH Score Latch: Repeated attempts needed to sustain latch, nipple held in mouth throughout feeding, stimulation needed to elicit sucking reflex.  Audible Swallowing: A few with stimulation  Type of Nipple: Everted at rest and after stimulation  Comfort (Breast/Nipple): Soft / non-tender  Hold (Positioning): Assistance needed to correctly position infant at breast and maintain latch.  LATCH Score: 7  Interventions Interventions: Breast feeding basics reviewed;Assisted with latch;Breast compression;Skin to skin;Adjust position;Breast massage;Support pillows;Hand express;Position options  Lactation Tools Discussed/Used Pump Review: Setup, frequency, and cleaning;Milk Storage Initiated by:: LM Date initiated:: 10/11/17   Consult Status Consult Status: Follow-up Date: 10/12/17 Follow-up type: In-patient    Huston Foley 10/11/2017, 11:31 AM

## 2017-10-11 NOTE — Anesthesia Postprocedure Evaluation (Signed)
Anesthesia Post Note  Patient: Samantha Bowers  Procedure(s) Performed: AN AD HOC LABOR EPIDURAL     Patient location during evaluation: Mother Baby Anesthesia Type: Epidural Level of consciousness: awake and alert, oriented and patient cooperative Pain management: pain level controlled Vital Signs Assessment: post-procedure vital signs reviewed and stable Respiratory status: spontaneous breathing Cardiovascular status: stable Postop Assessment: no headache, epidural receding, patient able to bend at knees and no signs of nausea or vomiting Anesthetic complications: no Comments: Pain score 0.    Last Vitals:  Vitals:   10/11/17 0215 10/11/17 0558  BP: 130/84 133/79  Pulse: 79 71  Resp: 18 16  Temp: 36.9 C 36.7 C  SpO2:      Last Pain:  Vitals:   10/11/17 0558  TempSrc:   PainSc: 0-No pain   Pain Goal:                 [facility]

## 2017-10-12 LAB — GLUCOSE, CAPILLARY
Glucose-Capillary: 88 mg/dL (ref 65–99)
Glucose-Capillary: 120 mg/dL — ABNORMAL HIGH (ref 65–99)

## 2017-10-12 MED ORDER — METFORMIN HCL 1000 MG PO TABS: 1000.0000 mg | ORAL_TABLET | Freq: Two times a day (BID) | ORAL | 3 refills | Status: DC

## 2017-10-12 MED ORDER — METFORMIN HCL 500 MG PO TABS
1000.0000 mg | ORAL_TABLET | Freq: Two times a day (BID) | ORAL | Status: DC
  Filled 2017-10-12 (×2): qty 2

## 2017-10-12 MED ORDER — IBUPROFEN 600 MG PO TABS: 600.0000 mg | ORAL_TABLET | Freq: Four times a day (QID) | ORAL | 0 refills | Status: DC

## 2017-10-12 MED ORDER — METFORMIN HCL 500 MG PO TABS
500.0000 mg | ORAL_TABLET | Freq: Once | ORAL | Status: AC
  Administered 2017-10-12: 500 mg via ORAL
  Filled 2017-10-12: qty 1

## 2017-10-12 NOTE — Discharge Summary (Addendum)
OB Discharge Summary     Patient Name: Samantha Bowers DOB: 08/29/1995 MRN: [id number]  Date of admission: 10/10/2017 Delivering MD: Frederik Pear   Date of discharge: 10/12/2017  Admitting diagnosis: INDUCTION Intrauterine pregnancy: [redacted]w[redacted]d     Secondary diagnosis:  Principal Problem:   Supervision of high risk pregnancy, antepartum Active Problems:   GBS (group B streptococcus) UTI complicating pregnancy   Pre-existing type 2 diabetes mellitus with hyperglycemia during pregnancy in third trimester (HCC)   Chlamydia infection complicating pregnancy   Poor glycemic control   Pre-existing type 2 diabetes mellitus in pregnancy   SVD (spontaneous vaginal delivery)  Additional problems: none     Discharge diagnosis: Term Pregnancy Delivered and Type 2 DM                                                                                                Post partum procedures:none  Augmentation: Pitocin  Complications: see below  Hospital course:  Onset of Labor With Vaginal Delivery     22 y.o. yo G1P1001 at [redacted]w[redacted]d was admitted in Latent Labor- as an IOL for T2DM on 10/10/2017. Membrane Rupture Time/Date: 1:39 PM ,10/10/2017   Patient had an uncomplicated labor course however at delivery there was some difficulty as follows:  Head delivered OA and shoulder dystocia immediately recognized. Patient already in McRobert's, suprapubic pressure applied, then rotation with Rubins maneuver attempted, followed by attempt to deliver posterior shoulder, but with some difficulty. Dr. Charlotta Newton arrived for assistance and was able to deliver posterior shoulder, and body followed quickly. Infant bulb suctioned, and cord clamped and cut by without delay, and handed to awaiting neonatal team. Cord blood and arterial gas drawn. IV Pitocin started. Placenta delivered spontaneously with gentle cord traction.  Brisk bleeding noted, and fundus found to have poor tone. Bimanual massage done, IM methergine, misoprostol  (400 mcg buccal and 400 mcg rectally) given. Tone improved with continued bimanual massage, but some continuous bleeding noted, so TXA 1g also given. 2nd degree perineal repaired with 3.0 Monocryl with good hemostasis achieved. EBL 700cc  Intrapartum Procedures: Episiotomy: None [1]                                         Lacerations:  2nd degree [3];Perineal [11]    Patient had a delivery of a Viable infant.  10/10/2017  Information for the patient's newborn:  Lanisha, Stepanian [[id number]]       Pateint had a postpartum course remarkable for some CBGs above goal range. Was initially on Metformin 500 bid; at d/c that was increased to 1000 bid.  She is ambulating, tolerating a regular diet, passing flatus, and urinating well. Patient is discharged home in stable condition on 10/12/17.   Physical exam  Vitals:   10/11/17 0215 10/11/17 0558 10/11/17 1727 10/12/17 0654  BP: 130/84 133/79 138/84 126/70  Pulse: 79 71 74 71  Resp: Temp: 98.4 F (36.9 C) 98.1 F (36.7 C) 99 F (  37.2 C) 97.9 F (36.6 C)  TempSrc: Oral  Oral Oral  SpO2:      Weight:      Height:       General: alert, cooperative and no distress Lochia: appropriate Uterine Fundus: firm Incision: N/A DVT Evaluation: No evidence of DVT seen on physical exam. Labs: Lab Results  Component Value Date   WBC 14.4 (H) 10/10/2017   HGB 12.4 10/10/2017   HCT 37.2 10/10/2017   MCV 90.3 10/10/2017   PLT 169 10/10/2017   CMP Latest Ref Rng & Units 09/19/2017  Creatinine 0.44 - 1.00 mg/dL 9.62    Discharge instruction: per After Visit Summary and "Baby and Me Booklet".  After visit meds:  Allergies as of 10/12/2017   No Known Allergies     Medication List    STOP taking these medications   cephALEXin 500 MG capsule Commonly known as:  KEFLEX   insulin lispro 100 UNIT/ML cartridge Commonly known as:  HUMALOG   insulin NPH Human 100 UNIT/ML injection Commonly known as:  HUMULIN N,NOVOLIN N    NIFEdipine 10 MG capsule Commonly known as:  PROCARDIA   promethazine 25 MG tablet Commonly known as:  PHENERGAN     TAKE these medications   glucose blood test strip Use as instructed   ibuprofen 600 MG tablet Commonly known as:  ADVIL,MOTRIN Take 1 tablet (600 mg total) by mouth every 6 (six) hours.   metFORMIN 1000 MG tablet Commonly known as:  GLUCOPHAGE Take 1 tablet (1,000 mg total) by mouth 2 (two) times daily with a meal. What changed:    medication strength  how much to take   prenatal multivitamin Tabs tablet Take 1 tablet by mouth daily at 12 noon.       Diet: routine diet  Activity: Advance as tolerated. Pelvic rest for 6 weeks.   Outpatient follow up:4 weeks Follow up Appt: Future Appointments  Date Time Provider Department Center  11/08/2017 10:15 AM Allie Bossier, MD [facility]   Follow up Visit:No follow-ups on file.  Postpartum contraception: Depo Provera  Newborn Data: Live born female  Birth Weight: 8 lb 14.7 oz (4045 g) APGAR: 5, 9  Newborn Delivery   Birth date/time:  10/10/2017 17:56:00 Delivery type:  Vaginal, Spontaneous     Baby Feeding: Bottle and Breast Disposition:home with mother   10/12/2017 Felisa Bonier, MD, PGY-1 Family Medicine- [facility]  CNM attestation I have seen and examined this patient and agree with above documentation in the resident's note.   DVORA BUITRON is a 22 y.o. G1P1001 s/p SVD.   Pain is well controlled.  Plan for birth control is Depo-Provera.  Method of Feeding: both  PE:  BP 126/70 (BP Location: Left Arm)   Pulse 71   Temp 97.9 F (36.6 C) (Oral)   Resp 16   Ht 6' (1.829 m)   Wt 127.1 kg (280 lb 1.6 oz)   LMP 01/10/2017 (Exact Date)   SpO2 100%   Breastfeeding? Unknown   BMI 37.99 kg/m  Fundus firm  Recent Labs    10/10/17 0725 10/10/17 2254  HGB 12.1 12.4  HCT 35.7* 37.2     Plan: discharge today - postpartum care discussed - f/u clinic in 4 weeks for  postpartum visit   Cam Hai, CNM 11:03 AM 10/12/2017

## 2017-10-12 NOTE — Discharge Instructions (Signed)
Vaginal Delivery, Care After °Refer to this sheet in the next few weeks. These instructions provide you with information about caring for yourself after vaginal delivery. Your health care provider may also give you more specific instructions. Your treatment has been planned according to current medical practices, but problems sometimes occur. Call your health care provider if you have any problems or questions. °What can I expect after the procedure? °After vaginal delivery, it is common to have: °· Some bleeding from your vagina. °· Soreness in your abdomen, your vagina, and the area of skin between your vaginal opening and your anus (perineum). °· Pelvic cramps. °· Fatigue. ° °Follow these instructions at home: °Medicines °· Take over-the-counter and prescription medicines only as told by your health care provider. °· If you were prescribed an antibiotic medicine, take it as told by your health care provider. Do not stop taking the antibiotic until it is finished. °Driving ° °· Do not drive or operate heavy machinery while taking prescription pain medicine. °· Do not drive for 24 hours if you received a sedative. °Lifestyle °· Do not drink alcohol. This is especially important if you are breastfeeding or taking medicine to relieve pain. °· Do not use tobacco products, including cigarettes, chewing tobacco, or e-cigarettes. If you need help quitting, ask your health care provider. °Eating and drinking °· Drink at least 8 eight-ounce glasses of water every day unless you are told not to by your health care provider. If you choose to breastfeed your baby, you may need to drink more water than this. °· Eat high-fiber foods every day. These foods may help prevent or relieve constipation. High-fiber foods include: °? Whole grain cereals and breads. °? Brown rice. °? Beans. °? Fresh fruits and vegetables. °Activity °· Return to your normal activities as told by your health care provider. Ask your health care provider  what activities are safe for you. °· Rest as much as possible. Try to rest or take a nap when your baby is sleeping. °· Do not lift anything that is heavier than your baby or 10 lb (4.5 kg) until your health care provider says that it is safe. °· Talk with your health care provider about when you can engage in sexual activity. This may depend on your: °? Risk of infection. °? Rate of healing. °? Comfort and desire to engage in sexual activity. °Vaginal Care °· If you have an episiotomy or a vaginal tear, check the area every day for signs of infection. Check for: °? More redness, swelling, or pain. °? More fluid or blood. °? Warmth. °? Pus or a bad smell. °· Do not use tampons or douches until your health care provider says this is safe. °· Watch for any blood clots that may pass from your vagina. These may look like clumps of dark red, brown, or black discharge. °General instructions °· Keep your perineum clean and dry as told by your health care provider. °· Wear loose, comfortable clothing. °· Wipe from front to back when you use the toilet. °· Ask your health care provider if you can shower or take a bath. If you had an episiotomy or a perineal tear during labor and delivery, your health care provider may tell you not to take baths for a certain length of time. °· Wear a bra that supports your breasts and fits you well. °· If possible, have someone help you with household activities and help care for your baby for at least a few days after   you leave the hospital. °· Keep all follow-up visits for you and your baby as told by your health care provider. This is important. °Contact a health care provider if: °· You have: °? Vaginal discharge that has a bad smell. °? Difficulty urinating. °? Pain when urinating. °? A sudden increase or decrease in the frequency of your bowel movements. °? More redness, swelling, or pain around your episiotomy or vaginal tear. °? More fluid or blood coming from your episiotomy or  vaginal tear. °? Pus or a bad smell coming from your episiotomy or vaginal tear. °? A fever. °? A rash. °? Little or no interest in activities you used to enjoy. °? Questions about caring for yourself or your baby. °· Your episiotomy or vaginal tear feels warm to the touch. °· Your episiotomy or vaginal tear is separating or does not appear to be healing. °· Your breasts are painful, hard, or turn red. °· You feel unusually sad or worried. °· You feel nauseous or you vomit. °· You pass large blood clots from your vagina. If you pass a blood clot from your vagina, save it to show to your health care provider. Do not flush blood clots down the toilet without having your health care provider look at them. °· You urinate more than usual. °· You are dizzy or light-headed. °· You have not breastfed at all and you have not had a menstrual period for 12 weeks after delivery. °· You have stopped breastfeeding and you have not had a menstrual period for 12 weeks after you stopped breastfeeding. °Get help right away if: °· You have: °? Pain that does not go away or does not get better with medicine. °? Chest pain. °? Difficulty breathing. °? Blurred vision or spots in your vision. °? Thoughts about hurting yourself or your baby. °· You develop pain in your abdomen or in one of your legs. °· You develop a severe headache. °· You faint. °· You bleed from your vagina so much that you fill two sanitary pads in one hour. °This information is not intended to replace advice given to you by your health care provider. Make sure you discuss any questions you have with your health care provider. °Document Released: 05/11/2000 Document Revised: 10/26/2015 Document Reviewed: 05/29/2015 °Elsevier Interactive Patient Education © 2018 [employer] ° °

## 2017-10-12 NOTE — Lactation Note (Signed)
This note was copied from a baby's chart. Lactation Consultation Note  Patient Name: Samantha Bowers Today's Date: 10/12/2017   P1, Baby 38 hours old.  Parents they recently gave baby a bottle of formula and have chosen to switch to formula. Offered assistance if needed. Discussed using cabbage leaves to reduce milk supply.     Maternal Data    Feeding    LATCH Score                   Interventions    Lactation Tools Discussed/Used     Consult Status      Dahlia Byes [facility] 10/12/2017, 8:16 AM

## 2017-10-14 LAB — GLUCOSE, CAPILLARY: GLUCOSE-CAPILLARY: 75 mg/dL (ref 65–99)

## 2017-10-16 ENCOUNTER — Other Ambulatory Visit: Payer: Self-pay

## 2017-10-16 ENCOUNTER — Inpatient Hospital Stay (HOSPITAL_COMMUNITY)
Admission: AD | Admit: 2017-10-16 | Discharge: 2017-10-16 | Disposition: A | Discharge status: Home / Self Care | Payer: Medicaid Other | Source: Ambulatory Visit | Attending: Obstetrics and Gynecology | Admitting: Obstetrics and Gynecology

## 2017-10-16 DIAGNOSIS — Z9189 Other specified personal risk factors, not elsewhere classified: Secondary | ICD-10-CM

## 2017-10-16 DIAGNOSIS — O1205 Gestational edema, complicating the puerperium: Secondary | ICD-10-CM | POA: Diagnosis present

## 2017-10-16 MED ORDER — SERTRALINE HCL 25 MG PO TABS: 25.0000 mg | ORAL_TABLET | Freq: Every day | ORAL | 2 refills | Status: DC

## 2017-10-16 MED ORDER — HYDROCHLOROTHIAZIDE 25 MG PO TABS: 25.0000 mg | ORAL_TABLET | Freq: Every day | ORAL | 0 refills | Status: DC

## 2017-10-16 NOTE — MAU Provider Note (Signed)
History     CSN: [id number]  Arrival date and time: 10/16/17 1528   First Provider Initiated Contact with Patient 10/16/17 1631      Chief Complaint  Patient presents with  . Leg Swelling   HPI   [name] is a 22 y.o. female G1P1001 status post vaginal delivery on 5/16 here with bilateral leg swelling. Says the swelling started following delivery. Says her activity has increased, however she does feel like she is resting and elevating her legs as well. She has mild pain in her feet from the swelling, none now. She has no HA, no scotoma. Says she had some issues for a brief period in labor, however no problems since.  Patient also has questions regarding the difference between postpartum blues and postpartum depression. Says she is enjoying her time with the baby however at times feels alone. She feels that she could benefit from more support from her mom and her partner. Says that her partner does not help out much around the house and that upsets her. Says at times she starts crying and she doesn't know why. Says she feels like she is bonding with her baby and has never had thoughts about harming her self or her baby.   OB History    Gravida  1   Para  1   Term  1   Preterm  0   AB  0   Living  1     SAB  0   TAB  0   Ectopic  0   Multiple  0   Live Births  1           Past Medical History:  Diagnosis Date  . Diabetes mellitus without complication [facility])     Past Surgical History:  Procedure Laterality Date  . NO PAST SURGERIES      Family History  Problem Relation Age of Onset  . Diabetes Mother   . Diabetes Father     Social History   Tobacco Use  . Smoking status: Never Smoker  . Smokeless tobacco: Never Used  Substance Use Topics  . Alcohol use: No    Frequency: Never  . Drug use: No    Allergies: No Known Allergies  Medications Prior to Admission  Medication Sig Dispense Refill Last Dose  . glucose blood test strip Use as  instructed 50 each 12 Taking  . ibuprofen (ADVIL,MOTRIN) 600 MG tablet Take 1 tablet (600 mg total) by mouth every 6 (six) hours. 30 tablet 0   . metFORMIN (GLUCOPHAGE) 1000 MG tablet Take 1 tablet (1,000 mg total) by mouth 2 (two) times daily with a meal. 30 tablet 3   . Prenatal Vit-Fe Fumarate-FA (PRENATAL MULTIVITAMIN) TABS tablet Take 1 tablet by mouth daily at 12 noon. 30 tablet 1 10/09/2017 at Unknown time   No results found for this or any previous visit (from the past 48 hour(s)).  Review of Systems  Constitutional: Negative for fever.  Psychiatric/Behavioral: Positive for decreased concentration. Negative for suicidal ideas. The patient is nervous/anxious.    Physical Exam   Blood pressure 129/80, pulse 76, temperature 99.4 F (37.4 C), resp. rate 18, SpO2 100 %, not currently breastfeeding.  Physical Exam  Constitutional: She is oriented to person, place, and time. She appears well-developed and well-nourished. No distress.  HENT:  Head: Normocephalic.  Eyes: Pupils are equal, round, and reactive to light.  Respiratory: Effort normal and breath sounds normal.  Musculoskeletal: Normal range of motion.  She exhibits edema (3+ pitting edema in bilateral lower extremities. ).  Neurological: She is alert and oriented to person, place, and time.  Skin: Skin is warm. She is not diaphoretic.  Psychiatric: Her behavior is normal.   MAU Course  Procedures  None  MDM  Prior to DC home patient's mother voices concerns about the patient's mood. Says she is worried that she does have postpartum depression. Says depression runs in her family and she does not feel like the patient is being honest with me about her symptoms. The mother was kindly asked to leave the room so that I could talk with the patient further in private. The patient voiced that her mother is a trigger to her depressive mood. She says her mom pushes her and brings up things in the past that cause her to become upset.  The patient was willing to consider Zoloft if she felt her symptoms worsened. The patient and I had a very open and respectful conversation and I feel the patient will reach out if her symptoms worsen. I will send Zoloft to the pharmacy for her to fill. We also discussed seeing Asher Muir our behavior specialist in which the patient was very agreeable.   Assessment and Plan   A:  1. Postpartum edema   2. At risk for depressed mood during postpartum period     P:  Discharge home in stable condition Rx: HCTZ X 5 days, Zoloft Message sent to the [facility] for referral to see Asher Muir Return to MAU if symptoms worsen Elevate legs as often as possible Increase oral fluid intake Discussed mommy and me classes at [facility]  Handy Mcloud, Victorino Dike I, NP 10/16/2017 5:17 PM

## 2017-10-16 NOTE — MAU Note (Signed)
Pt states she is a postpartum vaginal delivery and has had swelling in her feet but has no other complaints. Pt reports no pain at the moment.

## 2017-10-16 NOTE — MAU Note (Signed)
Urine in lab 

## 2017-10-16 NOTE — MAU Note (Signed)
When pt got to rm, assessed pt safety.  Pt asking about resources for post partem depression.

## 2017-10-16 NOTE — MAU Note (Signed)
Delivered a wk ago, vaginal delivery. Has noted an increase in swelling in feet, pt's mom was concerned. BP up at end of preg.  Has dull HA (has not taken anything),

## 2017-10-16 NOTE — Discharge Instructions (Signed)
Postpartum Depression and Baby Blues The postpartum period begins right after the birth of a baby. During this time, there is often a great amount of joy and excitement. It is also a time of many changes in the life of the parents. Regardless of how many times a mother gives birth, each child brings new challenges and dynamics to the family. It is not unusual to have feelings of excitement along with confusing shifts in moods, emotions, and thoughts. All mothers are at risk of developing postpartum depression or the "baby blues." These mood changes can occur right after giving birth, or they may occur many months after giving birth. The baby blues or postpartum depression can be mild or severe. Additionally, postpartum depression can go away rather quickly, or it can be a long-term condition. What are the causes? Raised hormone levels and the rapid drop in those levels are thought to be a main cause of postpartum depression and the baby blues. A number of hormones change during and after pregnancy. Estrogen and progesterone usually decrease right after the delivery of your baby. The levels of thyroid hormone and various cortisol steroids also rapidly drop. Other factors that play a role in these mood changes include major life events and genetics. What increases the risk? If you have any of the following risks for the baby blues or postpartum depression, know what symptoms to watch out for during the postpartum period. Risk factors that may increase the likelihood of getting the baby blues or postpartum depression include:  Having a personal or family history of depression.  Having depression while being pregnant.  Having premenstrual mood issues or mood issues related to oral contraceptives.  Having a lot of life stress.  Having marital conflict.  Lacking a social support network.  Having a baby with special needs.  Having health problems, such as diabetes.  What are the signs or  symptoms? Symptoms of baby blues include:  Brief changes in mood, such as going from extreme happiness to sadness.  Decreased concentration.  Difficulty sleeping.  Crying spells, tearfulness.  Irritability.  Anxiety.  Symptoms of postpartum depression typically begin within the first month after giving birth. These symptoms include:  Difficulty sleeping or excessive sleepiness.  Marked weight loss.  Agitation.  Feelings of worthlessness.  Lack of interest in activity or food.  Postpartum psychosis is a very serious condition and can be dangerous. Fortunately, it is rare. Displaying any of the following symptoms is cause for immediate medical attention. Symptoms of postpartum psychosis include:  Hallucinations and delusions.  Bizarre or disorganized behavior.  Confusion or disorientation.  How is this diagnosed? A diagnosis is made by an evaluation of your symptoms. There are no medical or lab tests that lead to a diagnosis, but there are various questionnaires that a health care provider may use to identify those with the baby blues, postpartum depression, or psychosis. Often, a screening tool called the Edinburgh Postnatal Depression Scale is used to diagnose depression in the postpartum period. How is this treated? The baby blues usually goes away on its own in 1-2 weeks. Social support is often all that is needed. You will be encouraged to get adequate sleep and rest. Occasionally, you may be given medicines to help you sleep. Postpartum depression requires treatment because it can last several months or longer if it is not treated. Treatment may include individual or group therapy, medicine, or both to address any social, physiological, and psychological factors that may play a role in the   depression. Regular exercise, a healthy diet, rest, and social support may also be strongly recommended. Postpartum psychosis is more serious and needs treatment right away.  Hospitalization is often needed. Follow these instructions at home:  Get as much rest as you can. Nap when the baby sleeps.  Exercise regularly. Some women find yoga and walking to be beneficial.  Eat a balanced and nourishing diet.  Do little things that you enjoy. Have a cup of tea, take a bubble bath, read your favorite magazine, or listen to your favorite music.  Avoid alcohol.  Ask for help with household chores, cooking, grocery shopping, or running errands as needed. Do not try to do everything.  Talk to people close to you about how you are feeling. Get support from your partner, family members, friends, or other new moms.  Try to stay positive in how you think. Think about the things you are grateful for.  Do not spend a lot of time alone.  Only take over-the-counter or prescription medicine as directed by your health care provider.  Keep all your postpartum appointments.  Let your health care provider know if you have any concerns. Contact a health care provider if: You are having a reaction to or problems with your medicine. Get help right away if:  You have suicidal feelings.  You think you may harm the baby or someone else. This information is not intended to replace advice given to you by your health care provider. Make sure you discuss any questions you have with your health care provider. Document Released: 02/16/2004 Document Revised: 10/20/2015 Document Reviewed: 02/23/2013 Elsevier Interactive Patient Education  2017 Elsevier Inc.  

## 2017-10-17 ENCOUNTER — Other Ambulatory Visit: Payer: Medicaid Other

## 2017-10-17 ENCOUNTER — Encounter: Payer: Medicaid Other | Admitting: Obstetrics & Gynecology

## 2017-10-18 ENCOUNTER — Other Ambulatory Visit: Payer: Self-pay | Admitting: Advanced Practice Midwife

## 2017-10-18 MED ORDER — SERTRALINE HCL 25 MG PO TABS: 50.0000 mg | ORAL_TABLET | Freq: Every day | ORAL | 2 refills | Status: DC

## 2017-10-18 NOTE — Progress Notes (Signed)
Patient contacted MAU after pharmacy declined coverage for Zoloft as ordered. Per pharmacy tech, coverage declined based on dosage. Original patient instructions preserved with same dosage, wording altered based on pharmacy recommended. Patient called, notified that Rx has been resent. Lelon Mast [facility] 10/18/17 12:29 PM

## 2017-11-05 ENCOUNTER — Inpatient Hospital Stay (HOSPITAL_COMMUNITY)
Admission: AD | Admit: 2017-11-05 | Discharge: 2017-11-05 | Disposition: A | Discharge status: Home / Self Care | Payer: Medicaid Other | Source: Ambulatory Visit | Attending: Obstetrics & Gynecology | Admitting: Obstetrics & Gynecology

## 2017-11-05 ENCOUNTER — Encounter (HOSPITAL_COMMUNITY): Payer: Self-pay | Admitting: *Deleted

## 2017-11-05 DIAGNOSIS — F32A Depression, unspecified: Secondary | ICD-10-CM | POA: Diagnosis present

## 2017-11-05 DIAGNOSIS — O9122 Nonpurulent mastitis associated with the puerperium: Secondary | ICD-10-CM | POA: Insufficient documentation

## 2017-11-05 DIAGNOSIS — F329 Major depressive disorder, single episode, unspecified: Secondary | ICD-10-CM | POA: Diagnosis present

## 2017-11-05 DIAGNOSIS — N644 Mastodynia: Secondary | ICD-10-CM | POA: Diagnosis present

## 2017-11-05 MED ORDER — ACETAMINOPHEN 500 MG PO TABS
1000.0000 mg | ORAL_TABLET | Freq: Once | ORAL | Status: AC
  Administered 2017-11-05: 1000 mg via ORAL
  Filled 2017-11-05: qty 2

## 2017-11-05 MED ORDER — DICLOXACILLIN SODIUM 500 MG PO CAPS: 500.0000 mg | ORAL_CAPSULE | Freq: Four times a day (QID) | ORAL | 0 refills | Status: DC

## 2017-11-05 MED ORDER — FLUCONAZOLE 150 MG PO TABS: 150.0000 mg | ORAL_TABLET | Freq: Every day | ORAL | 0 refills | Status: AC

## 2017-11-05 MED ORDER — IBUPROFEN 600 MG PO TABS: 600.0000 mg | ORAL_TABLET | Freq: Four times a day (QID) | ORAL | 0 refills | Status: DC

## 2017-11-05 MED ORDER — SERTRALINE HCL 50 MG PO TABS: 50.0000 mg | ORAL_TABLET | Freq: Every day | ORAL | 1 refills | Status: DC

## 2017-11-05 MED ORDER — ACETAMINOPHEN 500 MG PO TABS: 1000.0000 mg | ORAL_TABLET | Freq: Four times a day (QID) | ORAL | Status: DC | PRN

## 2017-11-05 NOTE — Discharge Instructions (Signed)
Mastitis  Mastitis is inflammation of the breast tissue. It occurs most often in women who are breastfeeding, but it can also affect other women, and even sometimes men.  What are the causes?  Mastitis is usually caused by a bacterial infection. Bacteria enter the breast tissue through cuts or openings in the skin. Typically, this occurs with breastfeeding because of cracked or irritated skin. Sometimes, it can occur even when there is no opening in the skin. It can be associated with plugged milk (lactiferous) ducts. Nipple piercing can also lead to mastitis. Also, some forms of breast cancer can cause mastitis.  What are the signs or symptoms?  · Swelling, redness, tenderness, and pain in an area of the breast.  · Swelling of the glands under the arm on the same side.  · Fever.  If an infection is allowed to progress, a collection of pus (abscess) may develop.  How is this diagnosed?  Your health care provider can usually diagnose mastitis based on your symptoms and a physical exam. Tests may be done to help confirm the diagnosis. These may include:  · Removal of pus from the breast by applying pressure to the area. This pus can be examined in the lab to determine which bacteria are present. If an abscess has developed, the fluid in the abscess can be removed with a needle. This can also be used to confirm the diagnosis and determine the bacteria present. In most cases, pus will not be present.  · Blood tests to determine if your body is fighting a bacterial infection.  · Mammogram or ultrasound tests to rule out other problems or diseases.    How is this treated?  Antibiotic medicine is used to treat a bacterial infection. Your health care provider will determine which bacteria are most likely causing the infection and will select an appropriate antibiotic. This is sometimes changed based on the results of tests performed to identify the bacteria, or if there is no response to the antibiotic selected. Antibiotics  are usually given by mouth. You may also be given medicine for pain.  Mastitis that occurs with breastfeeding will sometimes go away on its own, so your health care provider may choose to wait 24 hours after first seeing you to decide whether a prescription medicine is needed.  Follow these instructions at home:  · Only take over-the-counter or prescription medicines for pain, fever, or discomfort as directed by your health care provider.  · If your health care provider prescribed an antibiotic, take the medicine as directed. Make sure you finish it even if you start to feel better.  · Do not wear a tight or underwire bra. Wear a soft, supportive bra.  · Increase your fluid intake, especially if you have a fever.  · Women who are breastfeeding should follow these instructions:  ? Continue to empty the breast. Your health care provider can tell you whether this milk is safe for your infant or needs to be thrown out. You may be told to stop nursing until your health care provider thinks it is safe for your baby. Use a breast pump if you are advised to stop nursing.  ? Keep your nipples clean and dry.  ? Empty the first breast completely before going to the other breast. If your baby is not emptying your breasts completely for some reason, use a breast pump to empty your breasts.  ? If you go back to work, pump your breasts while at work to stay   in time with your nursing schedule.  ? Avoid allowing your breasts to become overly filled with milk (engorged).  Contact a health care provider if:  · You have pus-like discharge from the breast.  · Your symptoms do not improve with the treatment prescribed by your health care provider within 2 days.  Get help right away if:  · Your pain and swelling are getting worse.  · You have pain that is not controlled with medicine.  · You have a red line extending from the breast toward your armpit.  · You have a fever or persistent symptoms for more than 2-3 days.  · You have a fever  and your symptoms suddenly get worse.  This information is not intended to replace advice given to you by your health care provider. Make sure you discuss any questions you have with your health care provider.  Document Released: 05/14/2005 Document Revised: 10/20/2015 Document Reviewed: 12/12/2012  Elsevier Interactive Patient Education © 2017 Elsevier Inc.

## 2017-11-05 NOTE — MAU Note (Signed)
Pt states she woke up this morning with generalized body aches and not feeling well. States her right breast was tender last night and throughout the day it has gotten worse, especially when touched. States her breast is warm and "lumpy". Pt is not breastfeeding. Uses formula. States she had a temp 100.6 about 1 hour prior to arrival. States she has not taken anything for fever or pain.

## 2017-11-05 NOTE — MAU Provider Note (Signed)
History     CSN: [id number]  Arrival date and time: 11/05/17 1916   First Provider Initiated Contact with Patient 11/05/17 1959      Chief Complaint  Patient presents with  . Postpartum Complications  . Breast Pain   HPI  Ms.  CARLENE BICKLEY is a 22 y.o. year old [id number] female at Unknown weeks gestation who presents to MAU reporting waking up this morning with generalized body aches and "feeling like coming down with the flu". She states her RT breast was tender last night and throughout the day it has gotten worse. She reports that the RT breast hurts more with touch, warm and lumpy. She had a temp of 100.6 1 hour prior to arriving here. She is not taking anything for fever or pain. She is not breastfeeding; she's using formula.    Past Medical History:  Diagnosis Date  . Diabetes mellitus without complication [facility])     Past Surgical History:  Procedure Laterality Date  . NO PAST SURGERIES      Family History  Problem Relation Age of Onset  . Diabetes Mother   . Diabetes Father     Social History   Tobacco Use  . Smoking status: Never Smoker  . Smokeless tobacco: Never Used  Substance Use Topics  . Alcohol use: No    Frequency: Never  . Drug use: No    Allergies: No Known Allergies  Medications Prior to Admission  Medication Sig Dispense Refill Last Dose  . glucose blood test strip Use as instructed 50 each 12 Taking  . hydrochlorothiazide (HYDRODIURIL) 25 MG tablet Take 1 tablet (25 mg total) by mouth daily. 5 tablet 0   . ibuprofen (ADVIL,MOTRIN) 600 MG tablet Take 1 tablet (600 mg total) by mouth every 6 (six) hours. 30 tablet 0 10/16/2017 at Unknown time  . metFORMIN (GLUCOPHAGE) 1000 MG tablet Take 1 tablet (1,000 mg total) by mouth 2 (two) times daily with a meal. 30 tablet 3 10/16/2017 at Unknown time  . Prenatal Vit-Fe Fumarate-FA (PRENATAL MULTIVITAMIN) TABS tablet Take 1 tablet by mouth daily at 12 noon. 30 tablet 1 Past Week at Unknown time  .  sertraline (ZOLOFT) 25 MG tablet Take 2 tablets (50 mg total) by mouth daily. Start taking 25 mg X 1 week then increase to 50 mg daily. 90 tablet 2     Review of Systems  Constitutional: Positive for chills and fever.       Generalized body aches  HENT: Negative.   Eyes: Negative.   Respiratory: Negative.   Cardiovascular: Negative.   Gastrointestinal: Negative.   Endocrine: Negative.   Musculoskeletal: Negative.   Skin: Negative.   Allergic/Immunologic: Negative.   Neurological: Negative.   Hematological: Negative.   Psychiatric/Behavioral: Negative.    Physical Exam   Patient Vitals for the past 24 hrs:  BP Temp Temp src Pulse Resp SpO2 Height Weight  11/05/17 2226 (!) 144/81 - - 89 18 - - -  11/05/17 2225 - 98.8 F (37.1 C) Oral - - - - -  11/05/17 2144 - 99.9 F (37.7 C) Axillary - - - - -  11/05/17 2140 128/68 99.1 F (37.3 C) Oral 81 18 - - -  11/05/17 2104 - (!) 101 F (38.3 C) Axillary - - - - -  11/05/17 1927 135/72 (!) 100.5 F (38.1 C) Oral 100 18 100 % 6' (1.829 m) 251 lb (113.9 kg)    Physical Exam  Nursing note and vitals reviewed. Constitutional:  She is oriented to person, place, and time. She appears well-developed and well-nourished.  HENT:  Head: Normocephalic and atraumatic.  Eyes: Pupils are equal, round, and reactive to light.  Neck: Normal range of motion.  Cardiovascular: Normal rate.  Respiratory: Effort normal. Right breast exhibits tenderness.    3 cm thickened, mobile lump, skin hot to the touch, breasts soft, no erythema  GI: Soft.  Musculoskeletal: Normal range of motion.  Neurological: She is alert and oriented to person, place, and time.  Skin: Skin is warm and dry.  Psychiatric: She has a normal mood and affect. Her behavior is normal. Judgment and thought content normal.    MAU Course  Procedures  MDM Tylenol 1000 mg -- fever improved Discussed engorgement vs mastitis and recommendation of treatment for mastitis based on her  sx's; benefits outweigh the risks  Assessment and Plan  Mastitis during puerperium - Plan: Discharge patient - Rx for dicloxacillin 500 mg QID x 10 days and diflucan 150 mg po qd x 14 days - Advised to have Dr. Marice Potterove re-evaluate breasts at Friday 11/08/17 appt - F/U 1 wk after treatment completed - Apply ice to your breasts. Wear tight sports bra and limit stimulation of breasts. If continue fever, flu-like symptoms, red streaks, or redness on your breasts, take Tylenol 1000 mg by mouth every 6 hrs and alternate with Ibuprofen 600 mg every 6 hrs.  If your sx's do not improve, return to MAU for further evaluation.  - Discharge home - Patient verbalized an understanding of the plan of care and agrees.   Raelyn Moraolitta Kawanna Christley, MSN, CNM 11/05/2017, 7:59 PM

## 2017-11-08 ENCOUNTER — Encounter: Payer: Self-pay | Admitting: Obstetrics & Gynecology

## 2017-11-08 ENCOUNTER — Ambulatory Visit: Payer: Medicaid Other | Admitting: Obstetrics & Gynecology

## 2017-11-25 ENCOUNTER — Ambulatory Visit (INDEPENDENT_AMBULATORY_CARE_PROVIDER_SITE_OTHER): Payer: Medicaid Other | Admitting: Advanced Practice Midwife

## 2017-11-25 ENCOUNTER — Encounter: Payer: Self-pay | Admitting: Advanced Practice Midwife

## 2017-11-25 DIAGNOSIS — Z3201 Encounter for pregnancy test, result positive: Secondary | ICD-10-CM

## 2017-11-25 DIAGNOSIS — O24113 Pre-existing diabetes mellitus, type 2, in pregnancy, third trimester: Secondary | ICD-10-CM

## 2017-11-25 DIAGNOSIS — Z1389 Encounter for screening for other disorder: Secondary | ICD-10-CM

## 2017-11-25 DIAGNOSIS — E1165 Type 2 diabetes mellitus with hyperglycemia: Secondary | ICD-10-CM

## 2017-11-25 LAB — POCT PREGNANCY, URINE: PREG TEST UR: POSITIVE — AB

## 2017-11-25 NOTE — Patient Instructions (Signed)
Postpartum Depression and Baby Blues The postpartum period begins right after the birth of a baby. During this time, there is often a great amount of joy and excitement. It is also a time of many changes in the life of the parents. Regardless of how many times a mother gives birth, each child brings new challenges and dynamics to the family. It is not unusual to have feelings of excitement along with confusing shifts in moods, emotions, and thoughts. All mothers are at risk of developing postpartum depression or the "baby blues." These mood changes can occur right after giving birth, or they may occur many months after giving birth. The baby blues or postpartum depression can be mild or severe. Additionally, postpartum depression can go away rather quickly, or it can be a long-term condition. What are the causes? Raised hormone levels and the rapid drop in those levels are thought to be a main cause of postpartum depression and the baby blues. A number of hormones change during and after pregnancy. Estrogen and progesterone usually decrease right after the delivery of your baby. The levels of thyroid hormone and various cortisol steroids also rapidly drop. Other factors that play a role in these mood changes include major life events and genetics. What increases the risk? If you have any of the following risks for the baby blues or postpartum depression, know what symptoms to watch out for during the postpartum period. Risk factors that may increase the likelihood of getting the baby blues or postpartum depression include:  Having a personal or family history of depression.  Having depression while being pregnant.  Having premenstrual mood issues or mood issues related to oral contraceptives.  Having a lot of life stress.  Having marital conflict.  Lacking a social support network.  Having a baby with special needs.  Having health problems, such as diabetes.  What are the signs or  symptoms? Symptoms of baby blues include:  Brief changes in mood, such as going from extreme happiness to sadness.  Decreased concentration.  Difficulty sleeping.  Crying spells, tearfulness.  Irritability.  Anxiety.  Symptoms of postpartum depression typically begin within the first month after giving birth. These symptoms include:  Difficulty sleeping or excessive sleepiness.  Marked weight loss.  Agitation.  Feelings of worthlessness.  Lack of interest in activity or food.  Postpartum psychosis is a very serious condition and can be dangerous. Fortunately, it is rare. Displaying any of the following symptoms is cause for immediate medical attention. Symptoms of postpartum psychosis include:  Hallucinations and delusions.  Bizarre or disorganized behavior.  Confusion or disorientation.  How is this diagnosed? A diagnosis is made by an evaluation of your symptoms. There are no medical or lab tests that lead to a diagnosis, but there are various questionnaires that a health care provider may use to identify those with the baby blues, postpartum depression, or psychosis. Often, a screening tool called the Edinburgh Postnatal Depression Scale is used to diagnose depression in the postpartum period. How is this treated? The baby blues usually goes away on its own in 1-2 weeks. Social support is often all that is needed. You will be encouraged to get adequate sleep and rest. Occasionally, you may be given medicines to help you sleep. Postpartum depression requires treatment because it can last several months or longer if it is not treated. Treatment may include individual or group therapy, medicine, or both to address any social, physiological, and psychological factors that may play a role in the   depression. Regular exercise, a healthy diet, rest, and social support may also be strongly recommended. Postpartum psychosis is more serious and needs treatment right away.  Hospitalization is often needed. Follow these instructions at home:  Get as much rest as you can. Nap when the baby sleeps.  Exercise regularly. Some women find yoga and walking to be beneficial.  Eat a balanced and nourishing diet.  Do little things that you enjoy. Have a cup of tea, take a bubble bath, read your favorite magazine, or listen to your favorite music.  Avoid alcohol.  Ask for help with household chores, cooking, grocery shopping, or running errands as needed. Do not try to do everything.  Talk to people close to you about how you are feeling. Get support from your partner, family members, friends, or other new moms.  Try to stay positive in how you think. Think about the things you are grateful for.  Do not spend a lot of time alone.  Only take over-the-counter or prescription medicine as directed by your health care provider.  Keep all your postpartum appointments.  Let your health care provider know if you have any concerns. Contact a health care provider if: You are having a reaction to or problems with your medicine. Get help right away if:  You have suicidal feelings.  You think you may harm the baby or someone else. This information is not intended to replace advice given to you by your health care provider. Make sure you discuss any questions you have with your health care provider. Document Released: 02/16/2004 Document Revised: 10/20/2015 Document Reviewed: 02/23/2013 Elsevier Interactive Patient Education  2017 Elsevier Inc.  

## 2017-11-25 NOTE — Progress Notes (Signed)
Subjective:    States not really checking her cbg's. Did not take antibiotic because mastitiis got better. Not taking hctz because edema got better. Not taking metformin. States will take. Is still looking for PCP. Will send referral to [facility]. + UPT, denies intercourse.   Baxter FlatteryKania D Mathwig is a 22 y.o. female who presents for a postpartum visit. She is 6 weeks postpartum following a spontaneous vaginal delivery. I have fully reviewed the prenatal and intrapartum course. The delivery was at 39 gestational weeks. Outcome: spontaneous vaginal delivery. Anesthesia: epidural. Postpartum course has been uncomplicated. Baby's course has been uncomplicated. Baby is feeding by bottle - Gerber soy. Bleeding no bleeding. Bowel function is normal. Bladder function is normal. Patient is not sexually active. Patient denies any perineal pain. Contraception method is none. Postpartum depression screening: negative. Patient is on zoloft and would like to see Asher MuirJamie.    Patient denies any bleeding. Reports that bleeding stopped around week 3. She is bottle feeding, but reports that did get engorged. Patient states that she has not had intercourse since January.   The following portions of the patient's history were reviewed and updated as appropriate: allergies, current medications, past family history, past medical history, past social history, past surgical history and problem list.  Review of Systems Pertinent items are noted in HPI.   Objective:    There were no vitals taken for this visit.  General:  alert and cooperative   Breasts:  inspection negative, no nipple discharge or bleeding, no masses or nodularity palpable  Lungs: clear to auscultation bilaterally  Heart:  regular rate and rhythm, S1, S2 normal, no murmur, click, rub or gallop  Abdomen: soft, non-tender; bowel sounds normal; no masses,  no organomegaly   Vulva:  not evaluated  Vagina: not evaluated  Cervix:  not evaluated   Corpus: not examined   Adnexa:  not evaluated  Rectal Exam: Not performed.        Assessment:     Routine postpartum exam. Pap smear not done at today's visit.   Plan:    1. Contraception: none 2. HCG due to +UPT today  3. Follow up with HCG results. Will likely need repeat HCG or US depending on results.  [facility]. [facility] 5. [facility], on zoloft.

## 2017-11-26 ENCOUNTER — Encounter: Payer: Self-pay | Admitting: Advanced Practice Midwife

## 2017-11-26 LAB — BETA HCG QUANT (REF LAB): hCG Quant: 1 m[IU]/mL

## 2017-11-27 ENCOUNTER — Ambulatory Visit: Payer: Medicaid Other

## 2017-12-05 ENCOUNTER — Encounter (HOSPITAL_COMMUNITY): Payer: Self-pay | Admitting: Emergency Medicine

## 2017-12-05 ENCOUNTER — Emergency Department (HOSPITAL_COMMUNITY)
Admission: EM | Admit: 2017-12-05 | Discharge: 2017-12-06 | Disposition: A | Discharge status: Home / Self Care | Payer: Medicaid Other | Attending: Emergency Medicine | Admitting: Emergency Medicine

## 2017-12-05 ENCOUNTER — Other Ambulatory Visit: Payer: Self-pay

## 2017-12-05 DIAGNOSIS — E119 Type 2 diabetes mellitus without complications: Secondary | ICD-10-CM | POA: Insufficient documentation

## 2017-12-05 DIAGNOSIS — F329 Major depressive disorder, single episode, unspecified: Secondary | ICD-10-CM | POA: Insufficient documentation

## 2017-12-05 DIAGNOSIS — M7918 Myalgia, other site: Secondary | ICD-10-CM | POA: Diagnosis present

## 2017-12-05 DIAGNOSIS — Z79899 Other long term (current) drug therapy: Secondary | ICD-10-CM | POA: Insufficient documentation

## 2017-12-05 DIAGNOSIS — B349 Viral infection, unspecified: Secondary | ICD-10-CM | POA: Insufficient documentation

## 2017-12-05 NOTE — ED Triage Notes (Signed)
Pt reports generalized body aches starting 2 days ago. Pt denies fever, cough, nausea or vomiting.

## 2017-12-06 MED ORDER — METHOCARBAMOL 500 MG PO TABS: 500.0000 mg | ORAL_TABLET | Freq: Every day | ORAL | 0 refills | Status: DC | PRN

## 2017-12-06 MED ORDER — KETOROLAC TROMETHAMINE 30 MG/ML IJ SOLN
30.0000 mg | Freq: Once | INTRAMUSCULAR | Status: AC
  Administered 2017-12-06: 30 mg via INTRAMUSCULAR
  Filled 2017-12-06: qty 1

## 2017-12-06 NOTE — Discharge Instructions (Signed)
Take antibiotics that were prescribed to you by your prior provider. Take the prescribed medications as needed. Return to ED for worsening symptoms, severe chest or abdominal pain, vomiting up blood, injuries or falls.

## 2017-12-06 NOTE — ED Notes (Signed)
Patient Alert and oriented to baseline. Stable and ambulatory to baseline. Patient verbalized understanding of the discharge instructions.  Patient belongings were taken by the patient.   

## 2017-12-06 NOTE — ED Provider Notes (Signed)
[facility] Provider Note   CSN: [id number] Arrival date & time: 12/05/17  2308     History   Chief Complaint Chief Complaint  Patient presents with  . Generalized Body Aches    HPI Samantha Bowers is a 22 y.o. female with a past medical history of diabetes, who presents to ED for evaluation of generalized body aches.  Symptoms began 2 days ago.  She had a history of similar symptoms 3 weeks ago.  She was told by her OB/GYN that she may have metastatic.  She was given antibiotics but did not take them because she felt completely better the next day.  At that time she also had breast pain and a fever.  Patient denies fever this time.  Denies any cough, congestion, rhinorrhea, sore throat, sick contacts with similar symptoms, abdominal pain, vomiting, chest pain, breast pain, shortness of breath, tick bites.  HPI  Past Medical History:  Diagnosis Date  . Diabetes mellitus without complication [facility](HCC)     Patient Active Problem List   Diagnosis Date Noted  . Depression 11/05/2017  . Mastitis during puerperium 11/05/2017  . SVD (spontaneous vaginal delivery) 10/12/2017  . Pre-existing type 2 diabetes mellitus in pregnancy 10/10/2017  . GBS (group B streptococcus) UTI complicating pregnancy 07/12/2017  . Pre-existing type 2 diabetes mellitus with hyperglycemia during pregnancy in third trimester (HCC) 07/12/2017  . Chlamydia infection complicating pregnancy 07/12/2017  . Supervision of high risk pregnancy, antepartum 07/12/2017  . Poor glycemic control 07/12/2017    Past Surgical History:  Procedure Laterality Date  . NO PAST SURGERIES       OB History    Gravida  1   Para  1   Term  1   Preterm  0   AB  0   Living  1     SAB  0   TAB  0   Ectopic  0   Multiple  0   Live Births  1            Home Medications    Prior to Admission medications   Medication Sig Start Date End Date Taking? Authorizing Provider    dicloxacillin (DYNAPEN) 500 MG capsule Take 1 capsule (500 mg total) by mouth 4 (four) times daily. Patient not taking: Reported on 11/25/2017 11/05/17   Raelyn Moraawson, Rolitta, CNM  glucose blood test strip Use as instructed Patient not taking: Reported on 11/25/2017 10/01/17   Pincus LargePhelps, Jazma Y, DO  hydrochlorothiazide (HYDRODIURIL) 25 MG tablet Take 1 tablet (25 mg total) by mouth daily. Patient not taking: Reported on 11/25/2017 10/16/17   Rasch, Victorino DikeJennifer I, NP  ibuprofen (ADVIL,MOTRIN) 600 MG tablet Take 1 tablet (600 mg total) by mouth every 6 (six) hours. 11/05/17   Raelyn Moraawson, Rolitta, CNM  metFORMIN (GLUCOPHAGE) 1000 MG tablet Take 1 tablet (1,000 mg total) by mouth 2 (two) times daily with a meal. Patient not taking: Reported on 11/25/2017 10/12/17   Felisa BonierVidal, Camila T, MD  methocarbamol (ROBAXIN) 500 MG tablet Take 1 tablet (500 mg total) by mouth daily as needed for muscle spasms. 12/06/17   Shany Marinez, Hillary BowHina, PA-C  Prenatal Vit-Fe Fumarate-FA (PRENATAL MULTIVITAMIN) TABS tablet Take 1 tablet by mouth daily at 12 noon. Patient not taking: Reported on 11/25/2017 09/23/17   Tilda BurrowFerguson, John V, MD  sertraline (ZOLOFT) 50 MG tablet Take 1 tablet (50 mg total) by mouth daily. 11/05/17 01/04/18  Raelyn Moraawson, Rolitta, CNM    Family History Family History  Problem  Relation Age of Onset  . Diabetes Mother   . Diabetes Father     Social History Social History   Tobacco Use  . Smoking status: Never Smoker  . Smokeless tobacco: Never Used  Substance Use Topics  . Alcohol use: No    Frequency: Never  . Drug use: No     Allergies   Patient has no known allergies.   Review of Systems Review of Systems  Constitutional: Negative for appetite change, chills and fever.  HENT: Negative for ear pain, rhinorrhea, sneezing and sore throat.   Eyes: Negative for photophobia and visual disturbance.  Respiratory: Negative for cough, chest tightness, shortness of breath and wheezing.   Cardiovascular: Negative for chest pain and  palpitations.  Gastrointestinal: Negative for abdominal pain, blood in stool, constipation, diarrhea, nausea and vomiting.  Genitourinary: Negative for dysuria, hematuria and urgency.  Musculoskeletal: Positive for myalgias.  Skin: Negative for rash.  Neurological: Negative for dizziness, weakness and light-headedness.     Physical Exam Updated Vital Signs BP 127/79 (BP Location: Right Arm)   Pulse 74   Temp 98.7 F (37.1 C) (Oral)   Resp 16   Ht 6' (1.829 m)   Wt 112.5 kg (248 lb)   LMP 11/29/2017   SpO2 100%   BMI 33.63 kg/m   Physical Exam  Constitutional: She appears well-developed and well-nourished. No distress.  HENT:  Head: Normocephalic and atraumatic.  Nose: Nose normal.  Eyes: Conjunctivae and EOM are normal. Left eye exhibits no discharge. No scleral icterus.  Neck: Normal range of motion. Neck supple.  Cardiovascular: Normal rate, regular rhythm, normal heart sounds and intact distal pulses. Exam reveals no gallop and no friction rub.  No murmur heard. Pulmonary/Chest: Effort normal and breath sounds normal. No respiratory distress.  Abdominal: Soft. Bowel sounds are normal. She exhibits no distension. There is no tenderness. There is no guarding.  Musculoskeletal: Normal range of motion. She exhibits no edema.  Neurological: She is alert. She exhibits normal muscle tone. Coordination normal.  Skin: Skin is warm and dry. No rash noted.  Patient declines breast exam.  Psychiatric: She has a normal mood and affect.  Nursing note and vitals reviewed.    ED Treatments / Results  Labs (all labs ordered are listed, but only abnormal results are displayed) Labs Reviewed - No data to display  EKG None  Radiology No results found.  Procedures Procedures (including critical care time)  Medications Ordered in ED Medications  ketorolac (TORADOL) 30 MG/ML injection 30 mg (has no administration in time range)     Initial Impression / Assessment and Plan /  ED Course  I have reviewed the triage vital signs and the nursing notes.  Pertinent labs & imaging results that were available during my care of the patient were reviewed by me and considered in my medical decision making (see chart for details).     22 year old female with past medical history of diabetes presents for generalized body aches for the past 2 days.  History of similar symptoms 3 weeks ago and was diagnosed with mastitis.  Recent pregnancy 8 weeks ago.  Patient is not breast-feeding at this time.  Denies any breast pain.  Declines breast exam.  She appears overall well on physical exam.  She is afebrile.  Lungs are clear to auscultation bilaterally.  No abdominal tenderness to palpation.  Denies any chest pain, URI symptoms, abdominal pain, vomiting or sick contacts.  Suspect that symptoms could be viral in nature.  I informed patient that she should take the antibiotics that were prescribed to her for mastitis if her breast pain recurs.  Patient is comfortable with this plan.  Advised to return to ED for any severe worsening symptoms and to follow with PCP/OB/GYN.  Portions of this note were generated with Scientist, clinical (histocompatibility and immunogenetics). Dictation errors may occur despite best attempts at proofreading.   Final Clinical Impressions(s) / ED Diagnoses   Final diagnoses:  Viral syndrome    ED Discharge Orders        Ordered    methocarbamol (ROBAXIN) 500 MG tablet  Daily PRN     12/06/17 0121       Dietrich Pates, PA-C 12/06/17 0122    Zadie Rhine, MD 12/06/17 [phone]

## 2017-12-07 ENCOUNTER — Other Ambulatory Visit: Payer: Self-pay

## 2017-12-07 ENCOUNTER — Encounter: Payer: Self-pay | Admitting: Emergency Medicine

## 2017-12-07 ENCOUNTER — Emergency Department
Admission: EM | Admit: 2017-12-07 | Discharge: 2017-12-07 | Disposition: A | Discharge status: Home / Self Care | Payer: Medicaid Other | Attending: Emergency Medicine | Admitting: Emergency Medicine

## 2017-12-07 DIAGNOSIS — E119 Type 2 diabetes mellitus without complications: Secondary | ICD-10-CM | POA: Diagnosis not present

## 2017-12-07 DIAGNOSIS — Z79899 Other long term (current) drug therapy: Secondary | ICD-10-CM | POA: Diagnosis not present

## 2017-12-07 DIAGNOSIS — M130 Polyarthritis, unspecified: Secondary | ICD-10-CM | POA: Diagnosis present

## 2017-12-07 DIAGNOSIS — Z7984 Long term (current) use of oral hypoglycemic drugs: Secondary | ICD-10-CM | POA: Insufficient documentation

## 2017-12-07 DIAGNOSIS — M2550 Pain in unspecified joint: Secondary | ICD-10-CM

## 2017-12-07 HISTORY — DX: Major depressive disorder, single episode, unspecified: F32.9

## 2017-12-07 HISTORY — DX: Depression, unspecified: F32.A

## 2017-12-07 LAB — CBC WITH DIFFERENTIAL/PLATELET
Basophils Absolute: 0 10*3/uL (ref 0–0.1)
Basophils Relative: 1 %
Eosinophils Absolute: 0.1 10*3/uL (ref 0–0.7)
Eosinophils Relative: 1 %
HCT: 34.8 % — ABNORMAL LOW (ref 35.0–47.0)
Hemoglobin: 11.9 g/dL — ABNORMAL LOW (ref 12.0–16.0)
Lymphocytes Relative: 19 %
Lymphs Abs: 1.5 10*3/uL (ref 1.0–3.6)
MCH: 30.4 pg (ref 26.0–34.0)
MCHC: 34.1 g/dL (ref 32.0–36.0)
MCV: 89.2 fL (ref 80.0–100.0)
Monocytes Absolute: 0.5 10*3/uL (ref 0.2–0.9)
Monocytes Relative: 7 %
Neutro Abs: 5.6 10*3/uL (ref 1.4–6.5)
Neutrophils Relative %: 72 %
Platelets: 264 10*3/uL (ref 150–440)
RBC: 3.9 MIL/uL (ref 3.80–5.20)
RDW: 13.6 % (ref 11.5–14.5)
WBC: 7.7 10*3/uL (ref 3.6–11.0)

## 2017-12-07 LAB — COMPREHENSIVE METABOLIC PANEL
ALT: 20 U/L (ref 0–44)
AST: 21 U/L (ref 15–41)
Albumin: 3.9 g/dL (ref 3.5–5.0)
Alkaline Phosphatase: 88 U/L (ref 38–126)
Anion gap: 6 (ref 5–15)
BUN: 7 mg/dL (ref 6–20)
CO2: 28 mmol/L (ref 22–32)
Calcium: 8.9 mg/dL (ref 8.9–10.3)
Chloride: 102 mmol/L (ref 98–111)
Creatinine, Ser: 0.72 mg/dL (ref 0.44–1.00)
GFR calc Af Amer: >60 mL/min (ref >60)
GFR calc non Af Amer: >60 mL/min (ref >60)
Glucose, Bld: 283 mg/dL — ABNORMAL HIGH (ref 70–99)
Potassium: 3.9 mmol/L (ref 3.5–5.1)
Sodium: 136 mmol/L (ref 135–145)
Total Bilirubin: 0.7 mg/dL (ref 0.3–1.2)
Total Protein: 7.2 g/dL (ref 6.5–8.1)

## 2017-12-07 LAB — CK: Total CK: 80 U/L (ref 38–234)

## 2017-12-07 MED ORDER — PREDNISONE 20 MG PO TABS
60.0000 mg | ORAL_TABLET | Freq: Once | ORAL | Status: AC
  Administered 2017-12-07: 60 mg via ORAL
  Filled 2017-12-07: qty 3

## 2017-12-07 MED ORDER — PREDNISONE 50 MG PO TABS: ORAL_TABLET | ORAL | 0 refills | Status: DC

## 2017-12-07 NOTE — ED Notes (Signed)
Pt reports she has had intermittent joint pain and nausea for last couple weeks. Had baby 2 months ago and recent mastitis on R side. Denies recent fever/chills or V/D. Pt reports the pain gets so bad that she cannot even lift her baby up and it is difficult to walk. Hx of DM and on Metformin.

## 2017-12-07 NOTE — ED Triage Notes (Signed)
Pt c/o joint pain for 3 days; [facility] and diagnosed with viral syndrome; pt says she's here tonight because she didn't sleep last night and she just wants answers; pt had first baby about 2 months ago; history of Diabetes

## 2017-12-07 NOTE — ED Provider Notes (Signed)
[facility] Emergency Department Provider Note  ____________________________________________  Time seen: Approximately 11:08 PM  I have reviewed the triage vital signs and the nursing notes.   HISTORY  Chief Complaint Joint Pain    HPI Samantha Bowers is a 22 y.o. female with a history of diabetes, presents to the emergency department with polyarthralgia.  Patient is currently 2 months postpartum.  She reports joint pain for the past 3 days.  Pain is worse in the morning and improves some throughout the day.  Patient reports that joint stiffness and pain has become so severe that she has to have help from her mother and boyfriend to walk across the house.  Her  activities of daily living have been compromised due to joint stiffness and pain as patient reports that she is sedentary most of the day.  She denies fever, headache, rhinorrhea, congestion, nonproductive cough, emesis or diarrhea.  She denies prior diagnoses of autoimmune conditions such as rheumatoid arthritis or lupus.  No prior symptoms consistent with juvenile idiopathic arthritis in her youth.  Patient denies dysuria or recent conjunctivitis.  Patient does report a significant family history of autoimmune conditions on her mother side.  Patient reports that she has taken an anti-inflammatory which relieves her symptoms temporarily.    Past Medical History:  Diagnosis Date  . Depression   . Diabetes mellitus without complication [facility](HCC)     Patient Active Problem List   Diagnosis Date Noted  . Depression 11/05/2017  . Mastitis during puerperium 11/05/2017  . SVD (spontaneous vaginal delivery) 10/12/2017  . Pre-existing type 2 diabetes mellitus in pregnancy 10/10/2017  . GBS (group B streptococcus) UTI complicating pregnancy 07/12/2017  . Pre-existing type 2 diabetes mellitus with hyperglycemia during pregnancy in third trimester (HCC) 07/12/2017  . Chlamydia infection complicating pregnancy  07/12/2017  . Supervision of high risk pregnancy, antepartum 07/12/2017  . Poor glycemic control 07/12/2017    Past Surgical History:  Procedure Laterality Date  . NO PAST SURGERIES      Prior to Admission medications   Medication Sig Start Date End Date Taking? Authorizing Provider  dicloxacillin (DYNAPEN) 500 MG capsule Take 1 capsule (500 mg total) by mouth 4 (four) times daily. 11/05/17  Yes Arita Missawson, Dia Sitterolitta, CNM  glucose blood test strip Use as instructed 10/01/17  Yes Caryl AdaPhelps, Jazma Y, DO  metFORMIN (GLUCOPHAGE) 1000 MG tablet Take 1 tablet (1,000 mg total) by mouth 2 (two) times daily with a meal. 10/12/17  Yes Waunita SchoonerVidal, Camila T, MD  sertraline (ZOLOFT) 50 MG tablet Take 1 tablet (50 mg total) by mouth daily. 11/05/17 01/04/18 Yes Arita Missawson, Rolitta, CNM  predniSONE (DELTASONE) 50 MG tablet Take one 50 mg tablet once daily for the next five days. 12/07/17   Orvil FeilWoods, Jahzeel Poythress M, PA-C    Allergies Patient has no known allergies.  Family History  Problem Relation Age of Onset  . Diabetes Mother   . Diabetes Father     Social History Social History   Tobacco Use  . Smoking status: Never Smoker  . Smokeless tobacco: Never Used  Substance Use Topics  . Alcohol use: No    Frequency: Never  . Drug use: No     Review of Systems  Constitutional: No fever/chills Eyes: No visual changes. No discharge ENT: No upper respiratory complaints. Cardiovascular: no chest pain. Respiratory: no cough. No SOB. Gastrointestinal: No abdominal pain.  No nausea, no vomiting.  No diarrhea.  No constipation. Genitourinary: Negative for dysuria. No hematuria Musculoskeletal: Patient  has joint pain.  Skin: Negative for rash, abrasions, lacerations, ecchymosis. Neurological: Negative for headaches, focal weakness or numbness.   ____________________________________________   PHYSICAL EXAM:  VITAL SIGNS: ED Triage Vitals  Enc Vitals Group     BP 12/07/17 2159 122/76     Pulse Rate 12/07/17 2159 91      Resp 12/07/17 2159 17     Temp 12/07/17 2159 98.9 F (37.2 C)     Temp Source 12/07/17 2159 Oral     SpO2 12/07/17 2159 100 %     Weight 12/07/17 2200 248 lb (112.5 kg)     Height 12/07/17 2200 6' (1.829 m)     Head Circumference --      Peak Flow --      Pain Score 12/07/17 2200 6     Pain Loc --      Pain Edu? --      Excl. in GC? --      Constitutional: Alert and oriented. Well appearing and in no acute distress. Eyes: Conjunctivae are normal. PERRL. EOMI. Head: Atraumatic. ENT:      Ears: TMs are pearly      Nose: No congestion/rhinnorhea.      Mouth/Throat: Mucous membranes are moist.  Neck: No stridor.  Full range of motion. No cervical spine tenderness to palpation.. Hematological/Lymphatic/Immunilogical: No cervical lymphadenopathy.  Cardiovascular: Normal rate, regular rhythm. Normal S1 and S2.  Good peripheral circulation. Respiratory: Normal respiratory effort without tachypnea or retractions. Lungs CTAB. Good air entry to the bases with no decreased or absent breath sounds. Gastrointestinal: Bowel sounds 4 quadrants. Soft and nontender to palpation. No guarding or rigidity. No palpable masses. No distention. No CVA tenderness. Musculoskeletal: Full range of motion to all extremities. No gross deformities appreciated. Neurologic:  Normal speech and language. No gross focal neurologic deficits are appreciated.  Skin:  Skin is warm, dry and intact. No rash noted. Psychiatric: Mood and affect are normal. Speech and behavior are normal. Patient exhibits appropriate insight and judgement.   ____________________________________________   LABS (all labs ordered are listed, but only abnormal results are displayed)  Labs Reviewed  CBC WITH DIFFERENTIAL/PLATELET - Abnormal; Notable for the following components:      Result Value   Hemoglobin 11.9 (*)    HCT 34.8 (*)    All other components within normal limits  COMPREHENSIVE METABOLIC PANEL - Abnormal; Notable for  the following components:   Glucose, Bld 283 (*)    All other components within normal limits  CK   ____________________________________________  EKG   ____________________________________________  RADIOLOGY     No results found.  ____________________________________________    PROCEDURES  Procedure(s) performed:    Procedures    Medications  predniSONE (DELTASONE) tablet 60 mg (has no administration in time range)     ____________________________________________   INITIAL IMPRESSION / ASSESSMENT AND PLAN / ED COURSE  Pertinent labs & imaging results that were available during my care of the patient were reviewed by me and considered in my medical decision making (see chart for details).  Review of the [place] CSRS was performed in accordance of the [facility] prior to dispensing any controlled drugs.      Assessment and Plan:  Arthralgia  Patient presents to the emergency department with polyarthralgia for the past 3 days.  Patient reports that affected areas include ankles, knees, back and elbows.  Overall physical exam was completely reassuring.  Basic labs were obtained to assess for electrolyte abnormality or rhabdo.  CBC,  CMP and CK were reassuring other than hyperglycemia.  Patient was treated empirically with prednisone and referred to rheumatology.  All patient questions were answered.     ____________________________________________  FINAL CLINICAL IMPRESSION(S) / ED DIAGNOSES  Final diagnoses:  Polyarthralgia      NEW MEDICATIONS STARTED DURING THIS VISIT:  ED Discharge Orders        Ordered    predniSONE (DELTASONE) 50 MG tablet     12/07/17 2318          This chart was dictated using voice recognition software/Dragon. Despite best efforts to proofread, errors can occur which can change the meaning. Any change was purely unintentional.    Orvil Feil, PA-C 12/07/17 2326    Sharyn Creamer, MD 12/08/17 [phone]

## 2017-12-10 ENCOUNTER — Encounter: Payer: Self-pay | Admitting: Advanced Practice Midwife

## 2017-12-11 ENCOUNTER — Institutional Professional Consult (permissible substitution): Payer: Medicaid Other

## 2017-12-17 NOTE — BH Specialist Note (Deleted)
Integrated Behavioral Health Initial Visit  MRN: [id number] Name: Samantha FlatteryKania D Byron  Number of Integrated Behavioral Health Clinician visits:: 1/6 Session Start time: ***  Session End time: *** Total time: {IBH Total Time:21014050}  Type of Service: Integrated Behavioral Health- Individual/Family Interpretor:{yes NU:272536}no:314532} Interpretor Name and Language: ***   Warm Hand Off Completed.       SUBJECTIVE: Samantha Bowers is a 22 y.o. female accompanied by {CHL AMB ACCOMPANIED UY:[id number]}[phone]} Patient was referred by *** for ***. Patient reports the following symptoms/concerns: *** Duration of problem: ***; Severity of problem: {Mild/Moderate/Severe:20260}  OBJECTIVE: Mood: {BHH MOOD:22306} and Affect: {BHH AFFECT:22307} Risk of harm to self or others: {CHL AMB BH Suicide Current Mental Status:21022748}  LIFE CONTEXT: Family and Social: *** School/Work: *** Self-Care: *** Life Changes: ***  GOALS ADDRESSED: Patient will: 1. Reduce symptoms of: {IBH Symptoms:21014056} 2. Increase knowledge and/or ability of: {IBH Patient Tools:21014057}  3. Demonstrate ability to: {IBH Goals:21014053}  INTERVENTIONS: Interventions utilized: {IBH Interventions:21014054}  Standardized Assessments completed: {IBH Screening Tools:21014051}  ASSESSMENT: Patient currently experiencing ***.   Patient may benefit from ***.  PLAN: 1. Follow up with behavioral health clinician on : *** 2. Behavioral recommendations: *** 3. Referral(s): {IBH Referrals:21014055} 4. "From scale of 1-10, how likely are you to follow plan?": ***  Rae LipsJamie C McMannes, LCSW   Edinburgh Postnatal Depression Scale - 11/25/17 1007      Edinburgh Postnatal Depression Scale:  In the Past 7 Days   I have been able to laugh and see the funny side of things.  0    I have looked forward with enjoyment to things.  0    I have blamed myself unnecessarily when things went wrong.  0    I have been anxious or worried for no good  reason.  0    I have felt scared or panicky for no good reason.  0    Things have been getting on top of me.  0    I have been so unhappy that I have had difficulty sleeping.  0    I have felt sad or miserable.  0    I have been so unhappy that I have been crying.  0    The thought of harming myself has occurred to me.  0    Edinburgh Postnatal Depression Scale Total  0      Depression screen [facility] 2/9 09/19/2017 09/04/2017 08/29/2017 08/22/2017 07/18/2017  Decreased Interest 0 0 0 0 0  Down, Depressed, Hopeless 0 0 0 0 0  PHQ - 2 Score 0 0 0 0 0  Altered sleeping 0 0 0 0 0  Tired, decreased energy 0 0 0 0 0  Change in appetite 0 0 0 0 0  Feeling bad or failure about yourself  0 0 0 0 0  Trouble concentrating 0 0 0 0 0  Moving slowly or fidgety/restless 0 0 0 0 0  Suicidal thoughts 0 0 0 0 0  PHQ-9 Score 0 0 0 0 0   GAD 7 : Generalized Anxiety Score 09/19/2017 09/04/2017 08/29/2017 08/22/2017  Nervous, Anxious, on Edge 0 0 0 0  Control/stop worrying 0 0 0 0  Worry too much - different things 0 0 0 0  Trouble relaxing 0 0 0 0  Restless 0 0 0 0  Easily annoyed or irritable 0 0 0 0  Afraid - awful might happen 0 0 0 0  Total GAD 7 Score 0 0 0 0

## 2017-12-18 ENCOUNTER — Institutional Professional Consult (permissible substitution): Payer: Medicaid Other

## 2018-04-25 ENCOUNTER — Emergency Department
Admission: EM | Admit: 2018-04-25 | Discharge: 2018-04-26 | Disposition: A | Discharge status: Other Acute Inpatient Hospital | Payer: Medicaid Other | Attending: Emergency Medicine | Admitting: Emergency Medicine

## 2018-04-25 ENCOUNTER — Other Ambulatory Visit: Payer: Self-pay

## 2018-04-25 DIAGNOSIS — F329 Major depressive disorder, single episode, unspecified: Secondary | ICD-10-CM | POA: Diagnosis present

## 2018-04-25 DIAGNOSIS — F322 Major depressive disorder, single episode, severe without psychotic features: Secondary | ICD-10-CM | POA: Diagnosis not present

## 2018-04-25 DIAGNOSIS — E119 Type 2 diabetes mellitus without complications: Secondary | ICD-10-CM | POA: Diagnosis not present

## 2018-04-25 DIAGNOSIS — Z7984 Long term (current) use of oral hypoglycemic drugs: Secondary | ICD-10-CM | POA: Diagnosis not present

## 2018-04-25 DIAGNOSIS — F39 Unspecified mood [affective] disorder: Secondary | ICD-10-CM | POA: Diagnosis not present

## 2018-04-25 DIAGNOSIS — Z79899 Other long term (current) drug therapy: Secondary | ICD-10-CM | POA: Insufficient documentation

## 2018-04-25 DIAGNOSIS — E0800 Diabetes mellitus due to underlying condition with hyperosmolarity without nonketotic hyperglycemic-hyperosmolar coma (NKHHC): Secondary | ICD-10-CM

## 2018-04-25 LAB — COMPREHENSIVE METABOLIC PANEL
ALBUMIN: 4.1 g/dL (ref 3.5–5.0)
ALK PHOS: 80 U/L (ref 38–126)
ALT: 18 U/L (ref 0–44)
ANION GAP: 9 (ref 5–15)
AST: 20 U/L (ref 15–41)
BUN: 10 mg/dL (ref 6–20)
CO2: 23 mmol/L (ref 22–32)
Calcium: 9.3 mg/dL (ref 8.9–10.3)
Chloride: 101 mmol/L (ref 98–111)
Creatinine, Ser: 0.55 mg/dL (ref 0.44–1.00)
GFR calc non Af Amer: >60 mL/min (ref >60)
GLUCOSE: 450 mg/dL — AB (ref 70–99)
POTASSIUM: 4.1 mmol/L (ref 3.5–5.1)
SODIUM: 133 mmol/L — AB (ref 135–145)
Total Bilirubin: 0.7 mg/dL (ref 0.3–1.2)
Total Protein: 7.4 g/dL (ref 6.5–8.1)

## 2018-04-25 LAB — GLUCOSE, CAPILLARY
Glucose-Capillary: 368 mg/dL — ABNORMAL HIGH (ref 70–99)
Glucose-Capillary: 305 mg/dL — ABNORMAL HIGH (ref 70–99)

## 2018-04-25 LAB — URINE DRUG SCREEN, QUALITATIVE (ARMC ONLY)
Amphetamines, Ur Screen: NOT DETECTED
BARBITURATES, UR SCREEN: NOT DETECTED
BENZODIAZEPINE, UR SCRN: NOT DETECTED
CANNABINOID 50 NG, UR ~~LOC~~: NOT DETECTED
Cocaine Metabolite,Ur ~~LOC~~: NOT DETECTED
MDMA (Ecstasy)Ur Screen: NOT DETECTED
METHADONE SCREEN, URINE: NOT DETECTED
Opiate, Ur Screen: NOT DETECTED
Phencyclidine (PCP) Ur S: NOT DETECTED
TRICYCLIC, UR SCREEN: NOT DETECTED

## 2018-04-25 LAB — CBC
HCT: 42.5 % (ref 36.0–46.0)
Hemoglobin: 13.9 g/dL (ref 12.0–15.0)
MCH: 29.6 pg (ref 26.0–34.0)
MCHC: 32.7 g/dL (ref 30.0–36.0)
MCV: 90.6 fL (ref 80.0–100.0)
PLATELETS: 292 10*3/uL (ref 150–400)
RBC: 4.69 MIL/uL (ref 3.87–5.11)
RDW: 12.3 % (ref 11.5–15.5)
WBC: 7 10*3/uL (ref 4.0–10.5)
nRBC: 0 % (ref 0.0–0.2)

## 2018-04-25 LAB — SALICYLATE LEVEL

## 2018-04-25 LAB — POCT PREGNANCY, URINE: Preg Test, Ur: NEGATIVE

## 2018-04-25 LAB — ACETAMINOPHEN LEVEL

## 2018-04-25 LAB — ETHANOL: Alcohol, Ethyl (B): <10 mg/dL (ref <10)

## 2018-04-25 MED ORDER — METFORMIN HCL 500 MG PO TABS
500.0000 mg | ORAL_TABLET | Freq: Once | ORAL | Status: AC
  Administered 2018-04-25: 500 mg via ORAL
  Filled 2018-04-25: qty 1

## 2018-04-25 MED ORDER — SODIUM CHLORIDE 0.9 % IV BOLUS
1000.0000 mL | Freq: Once | INTRAVENOUS | Status: AC
  Administered 2018-04-25: 1000 mL via INTRAVENOUS

## 2018-04-25 MED ORDER — SODIUM CHLORIDE 0.9 % IV SOLN: Freq: Once | INTRAVENOUS | Status: DC

## 2018-04-25 NOTE — ED Notes (Signed)
Pt dressed by Vernona RiegerLaura EDT and this RN. 2 pt belongings bags collected and labeled.

## 2018-04-25 NOTE — ED Notes (Signed)
Called SOC for consult [phone] 

## 2018-04-25 NOTE — ED Notes (Signed)
SOC set up in patients room.

## 2018-04-25 NOTE — ED Notes (Signed)
Pt. Resting on bed in room.  [facility]  Pt. Has no questions or concerns at this time.

## 2018-04-25 NOTE — BH Assessment (Signed)
Assessment Note  Samantha Bowers is an 22 y.o. female. Ms. Kastens arrived to the ED by way of by personal transportation by a family friend.  She reports that she was having a panic attack, and she was "at a bad low where my thoughts were".  She states that she has depression and she had and argument with family members and she did not like where her thoughts were going.  She states that she could not breathe. She reports that she has calmed down since arriving to the hospital.  She reports symptoms of depression.  She reports that she zones out, sits in one spot, cries for hours, she gets angry and sad.  She reports that she feels irritable, but does not have a diagnosis of anxiety.  She denied having auditory or visual hallucinations.  She denied homicidal ideation or intent.  She reports having suicidal thoughts earlier in the day, but denied current thought of suicide.  She reports that she had a plan to harm herself with a knife, but she stated that her thoughts of her daughter made her not.  She states that her thoughts of her daughter often keep her from harming herself.  She denied the use of alcohol or drugs.  She reports stress from her family and pleasing others.  She reports that she has just moved and started a new job.  She shared that financial concerns also are stressful to her.  Diagnosis: Depression  Past Medical History:  Past Medical History:  Diagnosis Date  . Depression   . Diabetes mellitus without complication [facility])     Past Surgical History:  Procedure Laterality Date  . NO PAST SURGERIES      Family History:  Family History  Problem Relation Age of Onset  . Diabetes Mother   . Diabetes Father     Social History:  reports that she has never smoked. She has never used smokeless tobacco. She reports that she does not drink alcohol or use drugs.  Additional Social History:  Alcohol / Drug Use History of alcohol / drug use?: No history of alcohol / drug  abuse  CIWA: CIWA-Ar BP: 104/60 Pulse Rate: 71 COWS:    Allergies: No Known Allergies  Home Medications:  (Not in a hospital admission)  OB/GYN Status:  Patient's last menstrual period was 04/13/2018.  General Assessment Data Location of Assessment: [facility] ED TTS Assessment: In system Is this a Tele or Face-to-Face Assessment?: Face-to-Face Is this an Initial Assessment or a Re-assessment for this encounter?: Initial Assessment Patient Accompanied by:: N/A Language Other than English: No Living Arrangements: Other (Comment)(Private residence) What gender do you identify as?: Female Marital status: Single Pregnancy Status: No Living Arrangements: Spouse/significant other(Boyfriend) Can pt return to current living arrangement?: Yes Admission Status: Voluntary Is patient capable of signing voluntary admission?: Yes Referral Source: Self/Family/Friend Insurance type: Medicaid  Medical Screening Exam [facility] Walk-in ONLY) Medical Exam completed: Yes  Crisis Care Plan Living Arrangements: Spouse/significant other(Boyfriend) Legal Guardian: Other:(Self) Name of Psychiatrist: None Name of Therapist: None  Education Status Is patient currently in school?: Yes Current Grade: College Highest grade of school patient has completed: 12th Name of school: [employer]  Risk to self with the past 6 months Suicidal Ideation: No-Not Currently/Within Last 6 Months Has patient been a risk to self within the past 6 months prior to admission? : No Suicidal Intent: No-Not Currently/Within Last 6 Months Has patient had any suicidal intent within the past 6 months prior to admission? :  No Is patient at risk for suicide?: No Suicidal Plan?: No-Not Currently/Within Last 6 Months Has patient had any suicidal plan within the past 6 months prior to admission? : Yes Access to Means: Yes Specify Access to Suicidal Means: Patient has access to knives What has been your use of drugs/alcohol within the last  12 months?: denied use of drugs Previous Attempts/Gestures: No How many times?: 0 Other Self Harm Risks: denied Triggers for Past Attempts: None known Intentional Self Injurious Behavior: None Family Suicide History: No Recent stressful life event(s): Financial Problems, Other (Comment)(Family stressors) Persecutory voices/beliefs?: No Depression: Yes Depression Symptoms: Tearfulness, Feeling angry/irritable Substance abuse history and/or treatment for substance abuse?: No Suicide prevention information given to non-admitted patients: Not applicable  Risk to Others within the past 6 months Homicidal Ideation: No Does patient have any lifetime risk of violence toward others beyond the six months prior to admission? : No Thoughts of Harm to Others: No Current Homicidal Intent: No Current Homicidal Plan: No Access to Homicidal Means: No Identified Victim: None identified History of harm to others?: No Assessment of Violence: None Noted Does patient have access to weapons?: No Criminal Charges Pending?: No Does patient have a court date: No Is patient on probation?: No  Psychosis Hallucinations: None noted Delusions: None noted  Mental Status Report Appearance/Hygiene: In scrubs Eye Contact: Good Motor Activity: Freedom of movement, Unremarkable Speech: Logical/coherent Level of Consciousness: Alert Mood: Depressed Affect: Flat Anxiety Level: Minimal Thought Processes: Coherent Judgement: Unimpaired Orientation: Appropriate for developmental age Obsessive Compulsive Thoughts/Behaviors: None  Cognitive Functioning Concentration: Decreased Memory: Recent Intact Is patient IDD: No Insight: Fair Impulse Control: Fair Appetite: Poor Have you had any weight changes? : No Change Sleep: No Change Vegetative Symptoms: None  ADLScreening [facility]) Patient's cognitive ability adequate to safely complete daily activities?: Yes Patient able to express need  for assistance with ADLs?: Yes Independently performs ADLs?: Yes (appropriate for developmental age)  Prior Inpatient Therapy Prior Inpatient Therapy: No  Prior Outpatient Therapy Prior Outpatient Therapy: No Does patient have an ACCT team?: No Does patient have Intensive In-House Services?  : No Does patient have [facility] services? : No Does patient have P4CC services?: No  ADL Screening (condition at time of admission) Patient's cognitive ability adequate to safely complete daily activities?: Yes Is the patient deaf or have difficulty hearing?: No Does the patient have difficulty seeing, even when wearing glasses/contacts?: No Does the patient have difficulty concentrating, remembering, or making decisions?: No Patient able to express need for assistance with ADLs?: Yes Does the patient have difficulty dressing or bathing?: No Independently performs ADLs?: Yes (appropriate for developmental age) Does the patient have difficulty walking or climbing stairs?: No Weakness of Legs: None Weakness of Arms/Hands: None  Home Assistive Devices/Equipment Home Assistive Devices/Equipment: None    Abuse/Neglect Assessment (Assessment to be complete while patient is alone) Abuse/Neglect Assessment Can Be Completed: (Patient denied a history of abuse)     Advance Directives (For Healthcare) Does Patient Have a Medical Advance Directive?: No Would patient like information on creating a medical advance directive?: No - Patient declined          Disposition:  Disposition Initial Assessment Completed for this Encounter: Yes  On Site Evaluation by:   Reviewed with Physician:    Justice DeedsKeisha Baya Lentz 04/25/2018 9:10 PM

## 2018-04-25 NOTE — ED Notes (Signed)
Urine and blood  Collected and sent to lab   Lm edt

## 2018-04-25 NOTE — ED Triage Notes (Signed)
Pt arrives vol for depression and occasional SI thoughts. Has a young daughter and states when she thinks about her daughter SI thoughts go away. Denies HI. Used to zoloft but stopped meds on own. A&O, ambulatory. Cooperative. Symptoms on and off z a year or two per pt.

## 2018-04-25 NOTE — ED Notes (Signed)
Patient speaking with SOC at this time. TTS will attempt to assess when she is complete.

## 2018-04-25 NOTE — ED Notes (Signed)
Pt came to hospital because she was having SI without a plan. Pt has a 96 month old daughter and wants to "be better for her."   Pt had post partum depression, but stopped taking her Zoloft because "it wasn't working."     Maintained on 15 minute checks and observation by Psychologist, occupationalsecurity camera for safety.
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
## 2018-04-25 NOTE — ED Notes (Signed)
SOC called report given.  SOC machine set and ready.

## 2018-04-26 ENCOUNTER — Inpatient Hospital Stay
Admission: AD | Admit: 2018-04-26 | Discharge: 2018-04-28 | DRG: 885 | Disposition: A | Discharge status: Home / Self Care | Payer: Medicaid Other | Source: Intra-hospital | Attending: Psychiatry | Admitting: Psychiatry

## 2018-04-26 ENCOUNTER — Other Ambulatory Visit: Payer: Self-pay

## 2018-04-26 DIAGNOSIS — F332 Major depressive disorder, recurrent severe without psychotic features: Secondary | ICD-10-CM | POA: Diagnosis present

## 2018-04-26 DIAGNOSIS — E119 Type 2 diabetes mellitus without complications: Secondary | ICD-10-CM | POA: Diagnosis present

## 2018-04-26 DIAGNOSIS — R45851 Suicidal ideations: Secondary | ICD-10-CM | POA: Diagnosis present

## 2018-04-26 DIAGNOSIS — F411 Generalized anxiety disorder: Secondary | ICD-10-CM

## 2018-04-26 DIAGNOSIS — F322 Major depressive disorder, single episode, severe without psychotic features: Secondary | ICD-10-CM | POA: Diagnosis not present

## 2018-04-26 LAB — GLUCOSE, CAPILLARY
Glucose-Capillary: 332 mg/dL — ABNORMAL HIGH (ref 70–99)
Glucose-Capillary: 349 mg/dL — ABNORMAL HIGH (ref 70–99)
Glucose-Capillary: 325 mg/dL — ABNORMAL HIGH (ref 70–99)

## 2018-04-26 MED ORDER — LORAZEPAM 1 MG PO TABS: 1.0000 mg | ORAL_TABLET | Freq: Four times a day (QID) | ORAL | Status: DC | PRN

## 2018-04-26 MED ORDER — TRAZODONE HCL 50 MG PO TABS: 50.0000 mg | ORAL_TABLET | Freq: Every evening | ORAL | Status: DC | PRN

## 2018-04-26 MED ORDER — ACETAMINOPHEN 325 MG PO TABS: 650.0000 mg | ORAL_TABLET | Freq: Four times a day (QID) | ORAL | Status: DC | PRN

## 2018-04-26 MED ORDER — MAGNESIUM HYDROXIDE 400 MG/5ML PO SUSP: 30.0000 mL | Freq: Every day | ORAL | Status: DC | PRN

## 2018-04-26 MED ORDER — ALUM & MAG HYDROXIDE-SIMETH 200-200-20 MG/5ML PO SUSP: 30.0000 mL | ORAL | Status: DC | PRN

## 2018-04-26 MED ORDER — HYDROXYZINE HCL 25 MG PO TABS: 25.0000 mg | ORAL_TABLET | Freq: Three times a day (TID) | ORAL | Status: DC | PRN

## 2018-04-26 MED ORDER — METFORMIN HCL 500 MG PO TABS
500.0000 mg | ORAL_TABLET | Freq: Two times a day (BID) | ORAL | Status: DC
  Administered 2018-04-26: 500 mg via ORAL
  Filled 2018-04-26: qty 1

## 2018-04-26 MED ORDER — FLUOXETINE HCL 20 MG PO CAPS
20.0000 mg | ORAL_CAPSULE | Freq: Every day | ORAL | Status: DC
  Administered 2018-04-26 – 2018-04-28 (×3): 20 mg via ORAL
  Filled 2018-04-26 (×3): qty 1

## 2018-04-26 NOTE — Progress Notes (Signed)
LCSW met with patient and completed assessment and had patient sign consents for her family and for future referral at [facility]. LCSW will transfer data into Epic once patient in her BMU room.  LCSW called and spoke to patients mother and completed the SPE.    [employer] LCSW [phone]

## 2018-04-26 NOTE — ED Notes (Signed)
[facility] patient says she has been having intermittent suicidal thoughts for several months without a plan, but when she looks at her six-month-old daughter, they go away. Patient denies HI or AVH. She says she does not have any psych providers. She appears depressed and is calm, cooperative, and pleasant. She used the bathroom and was given water. She was made aware of camera monitoring. Checks and rounds also in place. Patient is now resting.

## 2018-04-26 NOTE — ED Notes (Signed)
Patient ate 100% of breakfast, no signs of distress, she used that phone, and took shower, will continue to monitor, Patient is awaiting bed to Behavioral Health unit.

## 2018-04-26 NOTE — H&P (Addendum)
Psychiatric Admission Assessment Adult  Patient Identification: Samantha Bowers Danielle Pearcy MRN:  [id number] Date of Evaluation:  04/26/2018 Chief Complaint:  Major depressive disorder Principal Diagnosis: <principal problem not specified> Diagnosis:  Active Problems:   Severe recurrent major depression (HCC)  History of Present Illness:  I was having argument with my family" Pt with h/o depression, post partum depression brought to ED by her friend for worsening depression, pt voiced suicidal statement. Pt admitted voluntarily. Pt reportedly also voiced to hurt self with knife. Pt states she is having a lot stressors recently, mother diagnosed with cancer, pt just moved here , and started new job at [employer], financial stressors. Pt admits worsening depression for a while after she stopped taking her zoloft , states it was not working anyway. Reports off and on suicidal thoughts, but wants to live with her 516 month old child,  Pt contracts for safety. Reports anxiety, difficulty to concentrate, irritability, helplessness. Pt admits not taking care of her diabetes, not taking metformin. Denies AVH. Pt lives with her boyfriend, and her child of 6 months.  Denies substance use.  Associated Signs/Symptoms: Depression Symptoms:  depressed mood, anhedonia, difficulty concentrating, hopelessness, (Hypo) Manic Symptoms:  Irritability  Anxiety Symptoms:  Anxiety attacks  Psychotic Symptoms:  denies PTSD Symptoms: denies Total Time spent with patient: 1 hour  Past Psychiatric History: depression, post partum, no psych hospitalization. H/o tx with zoloft but stopped last yr. Denies suicide attempt. Denies h/o violence.   Is the patient at risk to self? Yes.    Has the patient been a risk to self in the past 6 months? Yes.    Has the patient been a risk to self within the distant past? No.  Is the patient a risk to others? No.  Has the patient been a risk to others in the past 6 months? No.  Has the  patient been a risk to others within the distant past? No.   Prior Inpatient Therapy:   Prior Outpatient Therapy:    Alcohol Screening: 1. How often do you have a drink containing alcohol?: Never 2. How many drinks containing alcohol do you have on a typical day when you are drinking?: 1 or 2 3. How often do you have six or more drinks on one occasion?: Never AUDIT-C Score: 0 4. How often during the last year have you found that you were not able to stop drinking once you had started?: Never 5. How often during the last year have you failed to do what was normally expected from you becasue of drinking?: Never 6. How often during the last year have you needed a first drink in the morning to get yourself going after a heavy drinking session?: Never 7. How often during the last year have you had a feeling of guilt of remorse after drinking?: Never 8. How often during the last year have you been unable to remember what happened the night before because you had been drinking?: Never 9. Have you or someone else been injured as a result of your drinking?: No 10. Has a relative or friend or a doctor or another health worker been concerned about your drinking or suggested you cut down?: No Alcohol Use Disorder Identification Test Final Score (AUDIT): 0 Intervention/Follow-up: AUDIT Score <7 follow-up not indicated Substance Abuse History in the last 12 months:  Denies, UDS and BAL- negative Consequences of Substance Abuse: NA Previous Psychotropic Medications: Yes  Psychological Evaluations:  Past Medical History:  Past Medical History:  Diagnosis  Date  . Depression   . Diabetes mellitus without complication (HCC)    gestational    Past Surgical History:  Procedure Laterality Date  . NO PAST SURGERIES     Family History:  Family History  Problem Relation Age of Onset  . Diabetes Mother   . Diabetes Father    Family Psychiatric  History: Mom- bipolar, anxiety, dad- bipolar Tobacco  Screening: Have you used any form of tobacco in the last 30 days? (Cigarettes, Smokeless Tobacco, Cigars, and/or Pipes): No Social History:  Social History   Substance and Sexual Activity  Alcohol Use No  . Frequency: Never     Social History   Substance and Sexual Activity  Drug Use No    Additional Social History:      History of alcohol / drug use?: No history of alcohol / drug abuse                    Allergies:  No Known Allergies Lab Results:  Results for orders placed or performed during the hospital encounter of 04/25/18 (from the past 48 hour(s))  Comprehensive metabolic panel     Status: Abnormal   Collection Time: 04/25/18  3:46 PM  Result Value Ref Range   Sodium 133 (L) 135 - 145 mmol/L   Potassium 4.1 3.5 - 5.1 mmol/L   Chloride 101 98 - 111 mmol/L   CO2 23 22 - 32 mmol/L   Glucose, Bld 450 (H) 70 - 99 mg/dL   BUN 10 6 - 20 mg/dL   Creatinine, Ser 1.61 0.44 - 1.00 mg/dL   Calcium 9.3 8.9 - 09.6 mg/dL   Total Protein 7.4 6.5 - 8.1 g/dL   Albumin 4.1 3.5 - 5.0 g/dL   AST 20 15 - 41 U/L   ALT 18 0 - 44 U/L   Alkaline Phosphatase 80 38 - 126 U/L   Total Bilirubin 0.7 0.3 - 1.2 mg/dL   GFR calc non Af Amer >60 >60 mL/min   GFR calc Af Amer >60 >60 mL/min   Anion gap 9 5 - 15    Comment: Performed at [facility], [address] [place], [place] [address]  Ethanol     Status: None   Collection Time: 04/25/18  3:46 PM  Result Value Ref Range   Alcohol, Ethyl (B) <10 <10 mg/dL    Comment: (NOTE) Lowest detectable limit for serum alcohol is 10 mg/dL. For medical purposes only. Performed at [facility], [address] [place], [place] [address]   Salicylate level     Status: None   Collection Time: 04/25/18  3:46 PM  Result Value Ref Range   Salicylate Lvl <7.0 2.8 - 30.0 mg/dL    Comment: Performed at [facility], [address] [place], [place] [address]  Acetaminophen level     Status: Abnormal   Collection  Time: 04/25/18  3:46 PM  Result Value Ref Range   Acetaminophen (Tylenol), Serum <10 (L) 10 - 30 ug/mL    Comment: (NOTE) Therapeutic concentrations vary significantly. A range of 10-30 ug/mL  may be an effective concentration for many patients. However, some  are best treated at concentrations outside of this range. Acetaminophen concentrations >150 ug/mL at 4 hours after ingestion  and >50 ug/mL at 12 hours after ingestion are often associated with  toxic reactions. Performed at [facility], [address], [place], [place] [address]   cbc     Status: None  Collection Time: 04/25/18  3:46 PM  Result Value Ref Range   WBC 7.0 4.0 - 10.5 K/uL   RBC 4.69 3.87 - 5.11 MIL/uL   Hemoglobin 13.9 12.0 - 15.0 g/dL   HCT 16.1 09.6 - 04.5 %   MCV 90.6 80.0 - 100.0 fL   MCH 29.6 26.0 - 34.0 pg   MCHC 32.7 30.0 - 36.0 g/dL   RDW 40.9 81.1 - 91.4 %   Platelets 292 150 - 400 K/uL   nRBC 0.0 0.0 - 0.2 %    Comment: Performed at [facility], [address], [place], [place] [address]  Urine Drug Screen, Qualitative     Status: None   Collection Time: 04/25/18  3:46 PM  Result Value Ref Range   Tricyclic, Ur Screen NONE DETECTED NONE DETECTED   Amphetamines, Ur Screen NONE DETECTED NONE DETECTED   MDMA (Ecstasy)Ur Screen NONE DETECTED NONE DETECTED   Cocaine Metabolite,Ur [place] NONE DETECTED NONE DETECTED   Opiate, Ur Screen NONE DETECTED NONE DETECTED   Phencyclidine (PCP) Ur S NONE DETECTED NONE DETECTED   Cannabinoid 50 Ng, Ur [place] NONE DETECTED NONE DETECTED   Barbiturates, Ur Screen NONE DETECTED NONE DETECTED   Benzodiazepine, Ur Scrn NONE DETECTED NONE DETECTED   Methadone Scn, Ur NONE DETECTED NONE DETECTED    Comment: (NOTE) Tricyclics + metabolites, urine    Cutoff 1000 ng/mL Amphetamines + metabolites, urine  Cutoff 1000 ng/mL MDMA (Ecstasy), urine              Cutoff 500 ng/mL Cocaine Metabolite, urine          Cutoff 300 ng/mL Opiate + metabolites, urine         Cutoff 300 ng/mL Phencyclidine (PCP), urine         Cutoff 25 ng/mL Cannabinoid, urine                 Cutoff 50 ng/mL Barbiturates + metabolites, urine  Cutoff 200 ng/mL Benzodiazepine, urine              Cutoff 200 ng/mL Methadone, urine                   Cutoff 300 ng/mL The urine drug screen provides only a preliminary, unconfirmed analytical test result and should not be used for non-medical purposes. Clinical consideration and professional judgment should be applied to any positive drug screen result due to possible interfering substances. A more specific alternate chemical method must be used in order to obtain a confirmed analytical result. Gas chromatography / mass spectrometry (GC/MS) is the preferred confirmat ory method. Performed at [facility], [address] [place], [place] [address]   Pregnancy, urine POC     Status: None   Collection Time: 04/25/18  3:55 PM  Result Value Ref Range   Preg Test, Ur NEGATIVE NEGATIVE    Comment:        THE SENSITIVITY OF THIS METHODOLOGY IS >24 mIU/mL   Glucose, capillary     Status: Abnormal   Collection Time: 04/25/18  8:41 PM  Result Value Ref Range   Glucose-Capillary 368 (H) 70 - 99 mg/dL  Glucose, capillary     Status: Abnormal   Collection Time: 04/25/18 11:21 PM  Result Value Ref Range   Glucose-Capillary 305 (H) 70 - 99 mg/dL  Glucose, capillary     Status: Abnormal   Collection Time: 04/26/18  8:25 AM  Result Value Ref Range   Glucose-Capillary 332 (H) 70 -  99 mg/dL    Blood Alcohol level:  Lab Results  Component Value Date   ETH <10 04/25/2018    Metabolic Disorder Labs:  Lab Results  Component Value Date   HGBA1C 8.2 (H) 09/19/2017   MPG 188.64 09/19/2017   No results found for: PROLACTIN No results found for: CHOL, TRIG, HDL, CHOLHDL, VLDL, LDLCALC  Current Medications: Current Facility-Administered Medications  Medication Dose Route Frequency Provider Last Rate Last Dose  .  acetaminophen (TYLENOL) tablet 650 mg  650 mg Oral Q6H PRN Beverly Sessions, MD      . alum & mag hydroxide-simeth (MAALOX/MYLANTA) 200-200-20 MG/5ML suspension 30 mL  30 mL Oral Q4H PRN Beverly Sessions, MD      . hydrOXYzine (ATARAX/VISTARIL) tablet 25 mg  25 mg Oral TID PRN Beverly Sessions, MD      . magnesium hydroxide (MILK OF MAGNESIA) suspension 30 mL  30 mL Oral Daily PRN Beverly Sessions, MD      . metFORMIN (GLUCOPHAGE) tablet 500 mg  500 mg Oral BID WC Beverly Sessions, MD      . traZODone (DESYREL) tablet 50 mg  50 mg Oral QHS PRN Beverly Sessions, MD       PTA Medications: Facility-Administered Medications Prior to Admission  Medication Dose Route Frequency Provider Last Rate Last Dose  . acetaminophen (TYLENOL) tablet 1,000 mg  1,000 mg Oral Q6H PRN Raelyn Mora, CNM       Medications Prior to Admission  Medication Sig Dispense Refill Last Dose  . dicloxacillin (DYNAPEN) 500 MG capsule Take 1 capsule (500 mg total) by mouth 4 (four) times daily. 40 capsule 0 Not Taking  . glucose blood test strip Use as instructed 50 each 12 Not Taking  . metFORMIN (GLUCOPHAGE) 1000 MG tablet Take 1 tablet (1,000 mg total) by mouth 2 (two) times daily with a meal. 30 tablet 3 Not Taking  . predniSONE (DELTASONE) 50 MG tablet Take one 50 mg tablet once daily for the next five days. 5 tablet 0   . sertraline (ZOLOFT) 50 MG tablet Take 1 tablet (50 mg total) by mouth daily. 30 tablet 1 Taking    Musculoskeletal: Strength & Muscle Tone: within normal limits Gait & Station: normal Patient leans:   Psychiatric Specialty Exam: Physical Exam  Nursing note and vitals reviewed.   ROS  Blood pressure 117/69, pulse 81, temperature 98.4 F (36.9 C), temperature source Oral, resp. rate 18, height 6' (1.829 m), weight 113.9 kg, last menstrual period 04/13/2018, SpO2 100 %, not currently breastfeeding.Body mass index is 34.04 kg/m.  General Appearance: obese  Eye Contact:  Minimal  Speech:   Normal Rate  Volume:  Normal  Mood:  Anxious and Depressed  Affect:  Congruent and Depressed  Thought Process:  Goal Directed  Orientation:  Full (Time, Place, and Person)  Thought Content:  Rumination about her stressors  Suicidal Thoughts:  Off and on  Homicidal Thoughts:  No  Memory:  intact  Judgement:  Impaired  Insight:  Shallow  Psychomotor Activity:  Decreased  Concentration:  fair  Recall:  Good  Fund of Knowledge:  Good  Language:  Good  Akathisia:  No  Handed:    AIMS (if indicated):     Assets:  Communication Skills  ADL's:  Intact  Cognition:  WNL  Sleep:       Treatment Plan Summary: Daily contact with patient to assess and evaluate symptoms and progress in treatment and Medication management Pt with h/o depression, post partum depression  presenting with suicidal ideation in context of family stressors, and non compliance with meds- Plan-  Voluntary admission Try prozac for depression, anxiety. Discussed meds with pt, Risk, benefits and alternative discussed with pt. Pt states she does not breast feed her baby.  DM- metformin already started, will do HbA1c. Group/Milieu tx   Observation Level/Precautions:  15 minute checks  Laboratory:  CBC Chemistry Profile HbAIC HCG UDS UA  Psychotherapy:    Medications:    Consultations:    Discharge Concerns:    Estimated LOS:  Other:     Physician Treatment Plan for Primary Diagnosis: <principal problem not specified> Long Term Goal(s): Improvement in symptoms so as ready for discharge  Short Term Goals: Ability to identify changes in lifestyle to reduce recurrence of condition will improve, Ability to verbalize feelings will improve, Ability to disclose and discuss suicidal ideas, Ability to demonstrate self-control will improve, Ability to identify and develop effective coping behaviors will improve, Ability to maintain clinical measurements within normal limits will improve, Compliance with prescribed  medications will improve and Ability to identify triggers associated with substance abuse/mental health issues will improve  Physician Treatment Plan for Secondary Diagnosis: Active Problems:   Severe recurrent major depression (HCC)  Long Term Goal(s): Improvement in symptoms so as ready for discharge  Short Term Goals: Ability to identify changes in lifestyle to reduce recurrence of condition will improve, Ability to verbalize feelings will improve, Ability to disclose and discuss suicidal ideas, Ability to demonstrate self-control will improve, Ability to identify and develop effective coping behaviors will improve, Ability to maintain clinical measurements within normal limits will improve, Compliance with prescribed medications will improve and Ability to identify triggers associated with substance abuse/mental health issues will improve  I certify that inpatient services furnished can reasonably be expected to improve the patient's condition.    Beverly Sessions, MD 11/30/20192:16 PM

## 2018-04-26 NOTE — Plan of Care (Signed)
Pt. Verbalizes understanding of provided education. Pt. Complaint with medications. Pt. Denies si/hi/avh, verbally is able to contract for safety. Pt. Reports she can remain safe while on the unit. Pt. Denies depression and anxiety this evening. Reports normal mood.    Problem: Education: Goal: Knowledge of [facility] Education information/materials will improve Outcome: Progressing Goal: Emotional status will improve Outcome: Progressing Goal: Mental status will improve Outcome: Progressing   Problem: Health Behavior/Discharge Planning: Goal: Compliance with treatment plan for underlying cause of condition will improve Outcome: Progressing   Problem: Safety: Goal: Ability to disclose and discuss suicidal ideas will improve Outcome: Progressing   Problem: Safety: Goal: Ability to remain free from injury will improve Outcome: Progressing
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
## 2018-04-26 NOTE — Progress Notes (Signed)
D: Pt denies SI/HI/AVH, verbally is able to contract for safety. Pt is pleasant and cooperative, engages appropriately with this Clinical research associatewriter. Pt. This evening upon this writer's arrival to the unit is isolative and withdrawn to her room, but later is up in the milieu visible during snack time. Pt. Given extensive patient education on selecting carb modified foods for snack time to help control blood sugar levels. Pt. During our conversations expresses she has been feeling, "low energy", but otherwise when asked about depression and anxiety denies these. Pt. Does state though, "I'm glad I'm here so I can get my medications adjusted to start feeling better". Despite statements such as these, reports normal mood.   A: Q x 15 minute observation checks were completed for safety. Patient was provided with education, but needs reinforcement.  Patient was given/offered medications per orders. Patient  was encourage to attend groups, participate in unit activities and continue with plan of care. Pt. Chart and plans of care reviewed. Pt. Given support and encouragement.   R: Patient is complaint with medication and unit procedures with direction and encouragement. Pt. Blood sugars monitored per MD orders.             Precautionary checks every 15 minutes for safety maintained, room free of safety hazards, patient sustains no injury or falls during this shift. Will endorse care to next shift.

## 2018-04-26 NOTE — ED Notes (Signed)
Patient is eating lunch, no signs of distress, will continue to monitor, she [facility] that [facility]
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
## 2018-04-26 NOTE — ED Notes (Signed)
Nurse notified Dr. Shaune PollackLord regarding blood sugars, and inquired about sliding scale, she states that she would look into that" Nurse will continue to monitor.

## 2018-04-26 NOTE — BHH Suicide Risk Assessment (Signed)
B[facility] INPATIENT:  Family/Significant Other Suicide Prevention Education  Suicide Prevention Education:  Education Completed Clearnce SorrelKatrina Robinson [phone] has been identified by the patient as the family member/significant other with whom the patient will be residing, and identified as the person(s) who will aid the patient in the event of a mental health crisis (suicidal ideations/suicide attempt).  With written consent from the patient, the family member/significant other has been provided the following suicide prevention education, prior to the and/or following the discharge of the patient.  The suicide prevention education provided includes the following:  Suicide risk factors  Suicide prevention and interventions  National Suicide Hotline telephone number  [facility] assessment telephone number  [facility] Emergency Assistance 911  [facility] and/or Residential Mobile Crisis Unit telephone number  Request made of family/significant other to:  Remove weapons (e.g., guns, rifles, knives), all items previously/currently identified as safety concern.    Remove drugs/medications (over-the-counter, prescriptions, illicit drugs), all items previously/currently identified as a safety concern.  The family member/significant other verbalizes understanding of the suicide prevention education information provided.  The family member/significant other agrees to remove the items of safety concern listed above.  Arrie SenateBandi, Caylei Sperry M 04/26/2018, 4:03 PM

## 2018-04-26 NOTE — BH Assessment (Signed)
Patient is to be [facility] BM[facility] by Dr. Joseph ArtSubedi  Attending Physician will be Dr. Jennet MaduroPucilowska. Patient has been [facility].   Intake Paper Work has been signed and placed on patient chart.  ER staff is aware of the admission:  Ronnie: ER Secretary    Dr. Shaune PollackLord: ER MD   Toniann FailWendy: Patient's Nurse   Vikki PortsValerie: Patient Access.

## 2018-04-26 NOTE — Tx Team (Signed)
Initial Treatment Plan 04/26/2018 2:17 PM Samantha Bowers [id number]:[id number]    PATIENT STRESSORS: Financial difficulties Marital or family conflict Occupational concerns   PATIENT STRENGTHS: Capable of independent living Communication skills Physical Health Work skills   PATIENT IDENTIFIED PROBLEMS: Alteration in mood (Depression & anxiety) "my depression has been getting worse lately, I have to work  Alteration in sleep pattern (Insomnia) "I have not been sleeping well, I work at night then come home to take care of my 506 months old baby".                   DISCHARGE CRITERIA:  Improved stabilization in mood, thinking, and/or behavior Verbal commitment to aftercare and medication compliance  PRELIMINARY DISCHARGE PLAN: Outpatient therapy Return to previous living arrangement Return to previous work or school arrangements  PATIENT/FAMILY INVOLVEMENT: This treatment plan has been presented to and reviewed with the patient, Samantha Bowers . The patient have been given the opportunity to ask questions and make suggestions.  Sherryl MangesWesseh, Sabre Leonetti, RN 04/26/2018, 2:17 PM

## 2018-04-26 NOTE — Progress Notes (Signed)
BS 349 at dinner, Dr Joseph ArtSubedi made aware. Per MD, "Patient was just started on Metformin, she needs to comply with a diabetic diet." Will continue to monitor. Patient educated on making the proper food choices as a diabetic patient.

## 2018-04-26 NOTE — BHH Suicide Risk Assessment (Addendum)
[facility] Admission Suicide Risk Assessment   Nursing information obtained from:  Patient Demographic factors:  Adolescent or young adult, Low socioeconomic status Current Mental Status:  NA Loss Factors:  Financial problems / change in socioeconomic status Historical Factors:  Family history of mental illness or substance abuse Risk Reduction Factors:  Religious beliefs about death, Employed, Responsible for children under 20 years of age, Sense of responsibility to family  Total Time spent with patient: 1 hour Principal Problem: <principal problem not specified> Diagnosis:  Active Problems:   Severe recurrent major depression (HCC)  Subjective Data:  I was having argument with my family" Pt with h/o depression, post partum depression brought to ED by her friend for worsening depression, pt voiced suicidal statement. Pt admitted voluntarily. Pt reportedly also voiced to hurt self with knife. Pt states she is having a lot stressors recently, mother diagnosed with cancer, pt just moved here , and started new job at [employer], financial stressors. Pt admits worsening depression for a while after she stopped taking her zoloft , states it was not working anyway. Reports off and on suicidal thoughts, but wants to live with her 78 month old child,  Pt contracts for safety. Reports anxiety, difficulty to concentrate, irritability, helplessness. Pt admits not taking care of her diabetes, not taking metformin. Denies AVH. Pt lives with her boyfriend, and her child of 6 months.  Denies substance use.   Continued Clinical Symptoms:  Alcohol Use Disorder Identification Test Final Score (AUDIT): 0 The "Alcohol Use Disorders Identification Test", Guidelines for Use in Primary Care, Second Edition.  World Science writer [facility]). Score between 0-7:  no or low risk or alcohol related problems. Score between 8-15:  moderate risk of alcohol related problems. Score between 16-19:  high risk of alcohol related  problems. Score 20 or above:  warrants further diagnostic evaluation for alcohol dependence and treatment.   CLINICAL FACTORS:   depression, anxiety   Musculoskeletal: Strength & Muscle Tone: within normal limits Gait & Station: normal Patient leans:   Psychiatric Specialty Exam: Physical Exam  Nursing note and vitals reviewed.   ROS  Blood pressure 117/69, pulse 81, temperature 98.4 F (36.9 C), temperature source Oral, resp. rate 18, height 6' (1.829 m), weight 113.9 kg, last menstrual period 04/13/2018, SpO2 100 %, not currently breastfeeding.Body mass index is 34.04 kg/m.  General Appearance: obese  Eye Contact:  Minimal  Speech:  Normal Rate  Volume:  Normal  Mood:  Anxious and Depressed  Affect:  Congruent and Depressed  Thought Process:  Goal Directed  Orientation:  Full (Time, Place, and Person)  Thought Content:  Rumination about her stressors  Suicidal Thoughts:  Off and on  Homicidal Thoughts:  No  Memory:  intact  Judgement:  Impaired  Insight:  Shallow  Psychomotor Activity:  Decreased  Concentration:  fair  Recall:  Good  Fund of Knowledge:  Good  Language:  Good  Akathisia:  No  Handed:    AIMS (if indicated):     Assets:  Communication Skills  ADL's:  Intact  Cognition:  WNL  Sleep:         COGNITIVE FEATURES THAT CONTRIBUTE TO RISK:  Polarized thinking    SUICIDE RISK:   high  PLAN OF CARE:  Daily contact with patient to assess and evaluate symptoms and progress in treatment and Medication management Pt with h/o depression, post partum depression presenting with suicidal ideation in context of family stressors, and non compliance with meds- Plan- Monitor suicidal  thoughts. Voluntary admission to inpatient psych unit. Try prozac for depression, anxiety. Discussed meds with pt, Risk, benefits and alternative discussed with pt. Pt states she does not breast feed her baby.  DM- metformin already started, will do HbA1c. Group/Milieu tx    Observation Level/Precautions:  15 minute checks  Laboratory:  CBC Chemistry Profile HbAIC HCG UDS UA  Psychotherapy:    Medications:    Consultations:    Discharge Concerns:    Estimated LOS:  Other:     Physician Treatment Plan for Primary Diagnosis: <principal problem not specified> Long Term Goal(s): Improvement in symptoms so as ready for discharge  Short Term Goals: Ability to identify changes in lifestyle to reduce recurrence of condition will improve, Ability to verbalize feelings will improve, Ability to disclose and discuss suicidal ideas, Ability to demonstrate self-control will improve, Ability to identify and develop effective coping behaviors will improve, Ability to maintain clinical measurements within normal limits will improve, Compliance with prescribed medications will improve and Ability to identify triggers associated with substance abuse/mental health issues will improve  Physician Treatment Plan for Secondary Diagnosis: Active Problems:   Severe recurrent major depression (HCC)  Long Term Goal(s): Improvement in symptoms so as ready for discharge  Short Term Goals: Ability to identify changes in lifestyle to reduce recurrence of condition will improve, Ability to verbalize feelings will improve, Ability to disclose and discuss suicidal ideas, Ability to demonstrate self-control will improve, Ability to identify and develop effective coping behaviors will improve, Ability to maintain clinical measurements within normal limits will improve, Compliance with prescribed medications will improve and Ability to identify triggers associated with substance abuse/mental health issues will improve   I certify that inpatient services furnished can reasonably be expected to improve the patient's condition.   Beverly SessionsJagannath Tashi Andujo, MD 04/26/2018, 2:52 PM

## 2018-04-26 NOTE — Progress Notes (Signed)
[age] year old female admitted to unit due to worsin. Alert and oriented x 4.  Currently denies SI/HI/AVH at present. Patient verbally contracts for safety.  Patient reports that current stressors are the fact that her mother and boyfriend at constantly arguing, she doesn't get the needed support from her daughter's father and her finances are not where they should be. 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  Oriented patient to room and unit. Skin and contraband search completed and witnessed by Lissa Hoardlivette W, RN. Multiple tattoos to body observed to back and leg area. Skin otherwise warm, dry and intact. No contraband found on patient nor her belongings. Admission assessment completed, fluid and nutrition offered. Patient remains safe on the unit with q 15 minute checks.

## 2018-04-26 NOTE — ED Notes (Signed)
Patient voices understanding of discharge/readmit to BMU, all belongings taken with her and she [facility].

## 2018-04-26 NOTE — Progress Notes (Signed)
Patient case reviewed with on call MD regarding patient's blood sugar levels. After review with on call MD, orders received to continue to monitor per orders, with MD to re-review in the morning. Will continue to monitor for safety per MD orders.

## 2018-04-26 NOTE — ED Provider Notes (Signed)
Specialist on-call Dr. Curlene DolphinBermudez recommends inpatient admission.  And defers psychotropic medication to the inpatient psychiatric attending.   Samantha Bowers, Samantha Bronkema, MD 04/26/18 [phone]

## 2018-04-26 NOTE — ED Provider Notes (Signed)
Vitals:   04/25/18 1756 04/26/18 0623  BP: 104/60 (!) 105/54  Pulse: 71 72  Resp: 20 16  Temp: 98.1 F (36.7 C) 98.8 F (37.1 C)  SpO2: 100% 100%   No acute events reported to me by physician or nursing report.  Pending psychiatry admission, TTS dispo.   Governor RooksLord, Joncarlos Atkison, MD 04/26/18 [phone]

## 2018-04-26 NOTE — ED Notes (Signed)
Nurse talked to Patient and she states that she has been depressed on and off with worse bouts since her daughter was born, she states that her mom and dad have mood disorders and she believes that she does also and hopes to find the medication that will help her to be more stable, and that she works third shift and that has been difficult on her body, nurse talked to her about the BMU and the treatment plan, Patient is safe and denies a plan to harm self at this time, q 15 minute checks and camera surveillance in progress for safety,.

## 2018-04-27 ENCOUNTER — Encounter: Payer: Self-pay | Admitting: Psychiatry

## 2018-04-27 DIAGNOSIS — E119 Type 2 diabetes mellitus without complications: Secondary | ICD-10-CM

## 2018-04-27 LAB — LIPID PANEL
Cholesterol: 185 mg/dL (ref 0–200)
HDL: 57 mg/dL (ref >40)
LDL Cholesterol: 117 mg/dL — ABNORMAL HIGH (ref 0–99)
Total CHOL/HDL Ratio: 3.2 RATIO
Triglycerides: 56 mg/dL (ref <150)
VLDL: 11 mg/dL (ref 0–40)

## 2018-04-27 LAB — GLUCOSE, CAPILLARY
Glucose-Capillary: 272 mg/dL — ABNORMAL HIGH (ref 70–99)
Glucose-Capillary: 268 mg/dL — ABNORMAL HIGH (ref 70–99)
Glucose-Capillary: 243 mg/dL — ABNORMAL HIGH (ref 70–99)
Glucose-Capillary: 271 mg/dL — ABNORMAL HIGH (ref 70–99)

## 2018-04-27 LAB — HEMOGLOBIN A1C
Hgb A1c MFr Bld: 13.9 % — ABNORMAL HIGH (ref 4.8–5.6)
MEAN PLASMA GLUCOSE: 352.23 mg/dL

## 2018-04-27 MED ORDER — INSULIN ASPART 100 UNIT/ML ~~LOC~~ SOLN
0.0000 [IU] | Freq: Three times a day (TID) | SUBCUTANEOUS | Status: DC
  Administered 2018-04-27 – 2018-04-28 (×4): 5 [IU] via SUBCUTANEOUS
  Filled 2018-04-27 (×3): qty 1

## 2018-04-27 MED ORDER — METFORMIN HCL 500 MG PO TABS
1000.0000 mg | ORAL_TABLET | Freq: Two times a day (BID) | ORAL | Status: DC
  Administered 2018-04-27 – 2018-04-28 (×3): 1000 mg via ORAL
  Filled 2018-04-27 (×3): qty 2

## 2018-04-27 NOTE — Plan of Care (Signed)
Patient is alert and oriented X 4, denies SI, HI and AVH. Patient expressed concern about possible discharge date. RN provided education about Monday patient will have treatment team meeting and will be able to discuss a plan of action with psychiatrist and social worker. Patient expressed missing her baby. Patient states she is sleeping and eating well. Patient received toiletries to shower in which she did shower and changed clothing. Patient is logical and coherent in thought. Patient states she just moved to [place] from [place] not long ago and has medicaid, but does not have a primary care physician. Patient is out of the room today; present in the dayroom, pleasant and cooperative. Taking medications appropriately; patient received first insulin injection of 5 units this morning, tolerated well. RN educated patient on the sliding scale and insulin range. Patient voiced understanding. Patient states she feels safe on the unit. No self injurious behaviors noted. Safety checks to continue Q 15 minutes. Problem: Education: Goal: Knowledge of  General Education information/materials will improve Outcome: Progressing Goal: Emotional status will improve Outcome: Progressing Goal: Mental status will improve Outcome: Progressing Goal: Verbalization of understanding the information provided will improve Outcome: Progressing   Problem: Coping: Goal: Ability to verbalize frustrations and anger appropriately will improve Outcome: Progressing Goal: Ability to demonstrate self-control will improve Outcome: Progressing   Problem: Health Behavior/Discharge Planning: Goal: Identification of resources available to assist in meeting health care needs will improve Outcome: Progressing Goal: Compliance with treatment plan for underlying cause of condition will improve Outcome: Progressing

## 2018-04-27 NOTE — Plan of Care (Signed)
Pleasant on approach. Stayed in bed and was out for snack and  medications. Denying thoughts of self harm. Denying hallucinations. Depression 6/10

## 2018-04-27 NOTE — Progress Notes (Signed)
LCSW  Met with patient briefly to provide her a handout for 44C4- an Health team dedicated for new mothers and follow up care for children. Patient is agreeable to access for additional parent support resources.  Placed patients consents in her Binder: She is agreeable to [place] and gave full consent to speak to boyfriend and Mom. SPE was done as well with Mom.

## 2018-04-27 NOTE — Progress Notes (Signed)
[facility]  04/27/2018 10:54 AM Samantha KetKania Danielle Uriostegui  MRN:  [id number] Subjective:  Principal Problem: Severe recurrent major depression without psychotic features (HCC) Diagnosis: Principal Problem:   Severe recurrent major depression without psychotic features (HCC) Active Problems:   Type 2 diabetes mellitus (HCC)   " I feel better" Pt with h/o depression, post partum depression brought to ED by her friend for worsening depression, pt voiced suicidal statement. Pt reports feeling betteer, denies suicidal ideation/intent/plan. FS-272, recived 5u insulin. Slept well. Took meds, denies side effects.  Total Time spent with patient: 25 min  Past Psychiatric History: see H & P, no new info  Past Medical History:  Past Medical History:  Diagnosis Date  . Depression   . Diabetes mellitus without complication (HCC)    gestational    Past Surgical History:  Procedure Laterality Date  . NO PAST SURGERIES     Family History:  Family History  Problem Relation Age of Onset  . Diabetes Mother   . Diabetes Father    Family Psychiatric  History: see H & P, no new info Social History:  Social History   Substance and Sexual Activity  Alcohol Use No  . Frequency: Never     Social History   Substance and Sexual Activity  Drug Use No    Social History   Socioeconomic History  . Marital status: Single    Spouse name: Not on file  . Number of children: Not on file  . Years of education: Not on file  . Highest education level: Not on file  Occupational History  . Not on file  Social Needs  . Financial resource strain: Not on file  . Food insecurity:    Worry: Not on file    Inability: Not on file  . Transportation needs:    Medical: No    Non-medical: No  Tobacco Use  . Smoking status: Never Smoker  . Smokeless tobacco: Never Used  Substance and Sexual Activity  . Alcohol use: No    Frequency: Never  . Drug use: No  . Sexual activity: Not Currently     Birth control/protection: None  Lifestyle  . Physical activity:    Days per week: Not on file    Minutes per session: Not on file  . Stress: To some extent  Relationships  . Social connections:    Talks on phone: Not on file    Gets together: Not on file    Attends religious service: Not on file    Active member of club or organization: Not on file    Attends meetings of clubs or organizations: Not on file    Relationship status: Not on file  Other Topics Concern  . Not on file  Social History Narrative  . Not on file   Additional Social History:    History of alcohol / drug use?: No history of alcohol / drug abuse                    Sleep: Good  Appetite:  Fair  Current Medications: Current Facility-Administered Medications  Medication Dose Route Frequency Provider Last Rate Last Dose  . acetaminophen (TYLENOL) tablet 650 mg  650 mg Oral Q6H PRN Beverly SessionsSubedi, Nashaly Dorantes, MD      . alum & mag hydroxide-simeth (MAALOX/MYLANTA) 200-200-20 MG/5ML suspension 30 mL  30 mL Oral Q4H PRN Beverly SessionsSubedi, Able Malloy, MD      . FLUoxetine (PROZAC) capsule 20 mg  20 mg  Oral Daily Beverly Sessions, MD   20 mg at 04/27/18 0817  . hydrOXYzine (ATARAX/VISTARIL) tablet 25 mg  25 mg Oral TID PRN Beverly Sessions, MD      . insulin aspart (novoLOG) injection 0-9 Units  0-9 Units Subcutaneous TID WC Pucilowska, Jolanta B, MD   5 Units at 04/27/18 0820  . magnesium hydroxide (MILK OF MAGNESIA) suspension 30 mL  30 mL Oral Daily PRN Beverly Sessions, MD      . metFORMIN (GLUCOPHAGE) tablet 1,000 mg  1,000 mg Oral BID WC Pucilowska, Jolanta B, MD   1,000 mg at 04/27/18 0817  . traZODone (DESYREL) tablet 50 mg  50 mg Oral QHS PRN Beverly Sessions, MD        Lab Results:  Results for orders placed or performed during the hospital encounter of 04/26/18 (from the past 48 hour(s))  Glucose, capillary     Status: Abnormal   Collection Time: 04/26/18  4:14 PM  Result Value Ref Range   Glucose-Capillary  349 (H) 70 - 99 mg/dL  Glucose, capillary     Status: Abnormal   Collection Time: 04/26/18  8:29 PM  Result Value Ref Range   Glucose-Capillary 325 (H) 70 - 99 mg/dL  Glucose, capillary     Status: Abnormal   Collection Time: 04/27/18  7:09 AM  Result Value Ref Range   Glucose-Capillary 272 (H) 70 - 99 mg/dL   Comment 1 Notify RN   Lipid panel     Status: Abnormal   Collection Time: 04/27/18  7:25 AM  Result Value Ref Range   Cholesterol 185 0 - 200 mg/dL   Triglycerides 56 <161 mg/dL   HDL 57 >09 mg/dL   Total CHOL/HDL Ratio 3.2 RATIO   VLDL 11 0 - 40 mg/dL   LDL Cholesterol 604 (H) 0 - 99 mg/dL    Comment:        Total Cholesterol/HDL:CHD Risk Coronary Heart Disease Risk Table                     Men   Women  1/2 Average Risk   3.4   3.3  Average Risk       5.0   4.4  2 X Average Risk   9.6   7.1  3 X Average Risk  23.4   11.0        Use the calculated Patient Ratio above and the CHD Risk Table to determine the patient's CHD Risk.        ATP III CLASSIFICATION (LDL):  <100     mg/dL   Optimal  540-981  mg/dL   Near or Above                    Optimal  130-159  mg/dL   Borderline  191-478  mg/dL   High  >295     mg/dL   Very High Performed at [facility], [address] [place], [place] [address]     Blood Alcohol level:  Lab Results  Component Value Date   [facility] <10 04/25/2018    Metabolic Disorder Labs: Lab Results  Component Value Date   HGBA1C 8.2 (H) 09/19/2017   MPG 188.64 09/19/2017   No results found for: PROLACTIN Lab Results  Component Value Date   CHOL 185 04/27/2018   TRIG 56 04/27/2018   HDL 57 04/27/2018   CHOLHDL 3.2 04/27/2018   VLDL 11 04/27/2018   LDLCALC 117 (H) 04/27/2018  Physical Findings: AIMS: Facial and Oral Movements Muscles of Facial Expression: None, normal Lips and Perioral Area: None, normal Jaw: None, normal Tongue: None, normal,Extremity Movements Upper (arms, wrists, hands, fingers): None,  normal Lower (legs, knees, ankles, toes): None, normal, Trunk Movements Neck, shoulders, hips: None, normal, Overall Severity Severity of abnormal movements (highest score from questions above): None, normal Incapacitation due to abnormal movements: None, normal Patient's awareness of abnormal movements (rate only patient's report): No Awareness, Dental Status Current problems with teeth and/or dentures?: No Does patient usually wear dentures?: No  CIWA:    COWS:     Musculoskeletal: Strength & Muscle Tone: within normal limits Gait & Station: normal Patient leans:   Psychiatric Specialty Exam: Physical Exam  Nursing note and vitals reviewed.   ROS  Blood pressure 117/66, pulse 73, temperature 98.7 F (37.1 C), temperature source Oral, resp. rate 18, height 6' (1.829 m), weight 113.9 kg, last menstrual period 04/13/2018, SpO2 100 %, not currently breastfeeding.Body mass index is 34.04 kg/m.  General Appearance: obese  Eye Contact:  Minimal  Speech:  Normal Rate  Volume:  Normal  Mood:  Anxious and Depressed  Affect:  Congruent and Depressed  Thought Process:  Goal Directed  Orientation:  Full (Time, Place, and Person)  Thought Content:  going home  Suicidal Thoughts:  denies  Homicidal Thoughts:  No  Memory:  intact  Judgement:  Impaired  Insight:  Shallow  Psychomotor Activity:  Decreased  Concentration:  fair  Recall:  Good  Fund of Knowledge:  Good  Language:  Good  Akathisia:  No  Handed:    AIMS (if indicated):     Assets:  Communication Skills  ADL's:  Intact  Cognition:  WNL  Sleep:   7.3       Treatment Plan Summary: Daily contact with patient to assess and evaluate symptoms and progress in treatment and Medication management Pt with h/o depression, post partum depression presenting with suicidal ideation in context of family stressors, and non compliance with meds. Pt still depressed.  Plan-  Voluntary status. Cont  prozac for depression, anxiety.   DM- metformin , insulin sliding scale,   HbA1c-pending. Group/Milieu tx   Beverly Sessions, MD 04/27/2018, 10:54 AM

## 2018-04-27 NOTE — BHH Group Notes (Signed)
LCSW Group Therapy Note   04/27/2018 1:15pm   Type of Therapy and Topic:  Group Therapy:  Positive Affirmations   Participation Level:  Active  Description of Group: This group addressed positive affirmation toward self and others. Patients went around the room and identified two positive things about themselves and two positive things about a peer in the room. Patients reflected on how it felt to share something positive with others, to identify positive things about themselves, and to hear positive things from others. Patients were encouraged to have a daily reflection of positive characteristics or circumstances.  Therapeutic Goals 1. Patient will verbalize two of their positive qualities 2. Patient will demonstrate empathy for others by stating two positive qualities about a peer in the group 3. Patient will verbalize their feelings when voicing positive self affirmations and when voicing positive affirmations of others 4. Patients will discuss the potential positive impact on their wellness/recovery of focusing on positive traits of self and others. Summary of Patient Progress:  Stayed the entire time, engaged throughout.  "Depression runs in my family.  I was feeling suicidal before I came in.  This is difficult because I have not been away from my daughter this long.  But I knew I needed help."  Stated her family is a trigger "because they all have their own problems," but says she lives with boyfriend, and he is supportive.  Therapeutic Modalities Cognitive Behavioral Therapy Motivational Interviewing  Ida RogueRodney B Ringo [place], [place] 04/27/2018 12:04 PM

## 2018-04-28 DIAGNOSIS — F411 Generalized anxiety disorder: Secondary | ICD-10-CM

## 2018-04-28 DIAGNOSIS — F332 Major depressive disorder, recurrent severe without psychotic features: Principal | ICD-10-CM

## 2018-04-28 LAB — GLUCOSE, CAPILLARY: Glucose-Capillary: 260 mg/dL — ABNORMAL HIGH (ref 70–99)

## 2018-04-28 MED ORDER — HYDROXYZINE HCL 25 MG PO TABS: 25.0000 mg | ORAL_TABLET | Freq: Three times a day (TID) | ORAL | 1 refills | Status: DC | PRN

## 2018-04-28 MED ORDER — BLOOD GLUCOSE MONITOR KIT: PACK | 0 refills | Status: AC

## 2018-04-28 MED ORDER — "INSULIN SYRINGE-NEEDLE U-100 30G X 5/16"" 0.5 ML MISC": 0 refills | Status: AC

## 2018-04-28 MED ORDER — FLUOXETINE HCL 20 MG PO CAPS: 20.0000 mg | ORAL_CAPSULE | Freq: Every day | ORAL | 1 refills | Status: DC

## 2018-04-28 MED ORDER — METFORMIN HCL 1000 MG PO TABS: 1000.0000 mg | ORAL_TABLET | Freq: Two times a day (BID) | ORAL | 0 refills | Status: DC

## 2018-04-28 MED ORDER — INSULIN NPH ISOPHANE & REGULAR (70-30) 100 UNIT/ML ~~LOC~~ SUSP: 10.0000 [IU] | Freq: Two times a day (BID) | SUBCUTANEOUS | 0 refills | Status: DC

## 2018-04-28 NOTE — Progress Notes (Signed)
Recreation Therapy Notes  Date: 04/28/2018  Time: 9:30 am  Location: Craft Room  Behavioral response: Appropriate   Intervention Topic: Values  Discussion/Intervention:  Group content today was focused on values. The group identified what values are and where they come from. Individuals expressed some values and how many they have. Patients described how they go about add or removing values. The group described the importance of having values and how they go about using them in daily life. Patient participated in the intervention "My Values" where they were able to pick out values that were important to them and make a visual aide.  Clinical Observations/Feedback:  Patient came to group late due to unknown reasons. Individual was social with peers and staff while participating in the intervention. Ventura Hollenbeck LRT/CTRS           Janus Vlcek 04/28/2018 10:30 AM

## 2018-04-28 NOTE — Progress Notes (Signed)
  [facility] Adult [facility] Management Discharge Plan :  Will you be returning to the same living situation after discharge:  Yes,  pt lives with significant other and daughter At discharge, do you have transportation home?: Yes,  significant other will pick up at discharge Do you have the ability to pay for your medications: No. [facility]  Release of information consent forms completed and in the chart;  Patient's signature needed at discharge.  Patient to Follow up at: Follow-up Information    [facility] Go on 04/30/2018.   Why:  [facility] Wednesday, April 30, 2018 at 715 am. Please bring photo ID, medication and insurance. Thank you. Contact information: [address] [place] [id number] [phone]        [facility] Go in 1 week(s).   Specialty:  Primary Care Why:  For diabetes managment. You will need to fill out application to get services. Contact information: [address] [place] [phone]          Next level of care provider has access to [facility] Link:no  Safety Planning and Suicide Prevention discussed: Yes,  Clearnce Sorrelkatrina Robinson  Have you used any form of tobacco in the last 30 days? (Cigarettes, Smokeless Tobacco, Cigars, and/or Pipes): No  Has patient been referred to the Quitline?: NA  Patient has been referred for addiction treatment: N/A  Suzan SlickDARREN T Barbara Ahart, LCSW 04/28/2018, 10:05 AM

## 2018-04-28 NOTE — [facility] Suicide Risk Assessment (Signed)
[facility]   Principal Problem: Severe recurrent major depression without psychotic features [facility](HCC) Discharge Diagnoses: Principal Problem:   Severe recurrent major depression without psychotic features (HCC) Active Problems:   Type 2 diabetes mellitus (HCC)   GAD (generalized anxiety disorder)   Mental Status Per Nursing Assessment::   On Admission:  NA  Demographic Factors:  NA  Loss Factors: NA  Historical Factors: Impulsivity  Risk Reduction Factors:   Responsible for children under 20 years of age, Sense of responsibility to family, Living with another person, especially a relative, Positive social support, Positive therapeutic relationship and Positive coping skills or problem solving skills  Continued Clinical Symptoms:  Panic Attacks  Cognitive Features That Contribute To Risk:  None    Suicide Risk:  Mild Acute Risk:  Suicidal ideation of limited frequency, intensity, duration, and specificity.  There are no identifiable plans, no associated intent, mild dysphoria and related symptoms, good self-control (both objective and subjective assessment), few other risk factors, and identifiable protective factors, including available and accessible social support.    Plan Of Care/Follow-up recommendations:  Follow up with [facility] on 04/30/18 Follow up with PCP regarding diabetes management  Haskell RilingHolly R Elwyn Klosinski, MD 04/28/2018, 9:43 AM

## 2018-04-28 NOTE — Progress Notes (Signed)
Patient stayed in room until medication time. Upon assessment, patient was receptive and engaged in the conversation. Alert and oriented, well kept, pleasant and cooperative. Denied thoughts of self harm and stated "it was just stress....". Discussed about her baby and became emotional, smiling  And stated " I miss her...". Patient presented to the medication room for services. CBG 271. No bedtime coverage. Discussed her medication regime  Then went to the dayroom.  Had  a snack. Did not need sleeping medications. Patient went to bed and has been sleeping. Emotional support provided. Was encouraged to express feelings and concerns as presented. Patient's safety maintained.

## 2018-04-28 NOTE — Progress Notes (Signed)
D:Patient denies SI/HI/AVH at this time, verbally is able to contract for safety. Pt appears calm and cooperative, and no distress noted. Pt. Given extensive discharge education.   A: All Personal items in locker returned to pt. Pt escorted out of the building by staff for safety.  R:  Pt States she will comply with outpatient services, and take MEDS as prescribed.

## 2018-04-28 NOTE — Discharge Summary (Signed)
Physician Discharge Summary Note  Patient:  Samantha Bowers is an 22 y.o., female MRN:  [id number] DOB:  08-Dec-1995 Patient phone:  [phone] (home)  Patient address:   [place] [address],  Total Time spent with patient: 30 minutes Plus 20 minutes of medication reconciliation, discharge planning, and discharge documentation  Date of Admission:  04/26/2018 Date of Discharge: 04/28/18  Reason for Admission:  I was having argument with my family" Pt with h/o depression, post partum depression brought to ED by her friend for worsening depression, pt voiced suicidal statement. Pt admitted voluntarily. Pt reportedly also voiced to hurt self with knife. Pt states she is having a lot stressors recently, mother diagnosed with cancer, pt just moved here , and started new job at [employer], financial stressors. Pt admits worsening depression for a while after she stopped taking her Zoloft , states it was not working anyway. Reports off and on suicidal thoughts, but wants to live with her 77 month old child,  Pt contracts for safety. Reports anxiety, difficulty to concentrate, irritability, helplessness. Pt admits not taking care of her diabetes, not taking metformin. Denies AVH. Pt lives with her boyfriend, and her child of 6 months.  Denies substance use.   Principal Problem: Severe recurrent major depression without psychotic features [facility]) Discharge Diagnoses: Principal Problem:   Severe recurrent major depression without psychotic features ([place]) Active Problems:   Type 2 diabetes mellitus (HCC)   GAD (generalized anxiety disorder)   Past Psychiatric History: See H&P  Past Medical History:  Past Medical History:  Diagnosis Date  . Depression   . Diabetes mellitus without complication (HCC)    gestational    Past Surgical History:  Procedure Laterality Date  . NO PAST SURGERIES     Family History:  Family History  Problem Relation Age of Onset  . Diabetes Mother    . Diabetes Father    Family Psychiatric  History: See H&P Social History:  Social History   Substance and Sexual Activity  Alcohol Use No  . Frequency: Never     Social History   Substance and Sexual Activity  Drug Use No    Social History   Socioeconomic History  . Marital status: Single    Spouse name: Not on file  . Number of children: Not on file  . Years of education: Not on file  . Highest education level: Not on file  Occupational History  . Not on file  Social Needs  . Financial resource strain: Not on file  . Food insecurity:    Worry: Not on file    Inability: Not on file  . Transportation needs:    Medical: No    Non-medical: No  Tobacco Use  . Smoking status: Never Smoker  . Smokeless tobacco: Never Used  Substance and Sexual Activity  . Alcohol use: No    Frequency: Never  . Drug use: No  . Sexual activity: Not Currently    Birth control/protection: None  Lifestyle  . Physical activity:    Days per week: Not on file    Minutes per session: Not on file  . Stress: To some extent  Relationships  . Social connections:    Talks on phone: Not on file    Gets together: Not on file    Attends religious service: Not on file    Active member of club or organization: Not on file    Attends meetings of clubs or organizations: Not on file  Relationship status: Not on file  Other Topics Concern  . Not on file  Social History Narrative  . Not on file    Hospital Course:  Pt was started on Prozac for depression and anxiety. She was noted to have elevated HgA1c. Diabetes care coordinator was consulted and recommended her to be started on Insulin in addition to Metformin. They did come and talk with patient about it. Pt participated well on the unit and attended groups. On day of discharge, she reported feeling much better. She states that she feels calmer and much less anxious. She states that time away from her situation was very helpful. Groups also  taught her better coping skills with how to deal with conflict with her family which was a big stressor. She has a 44 month old daughter at home and she is what keeps her going. She states that she misses her so much and wants to go home to see her. Pt works full time night shift at [employer] and does enjoy her job. She is very motivated to get better and to go to therapy. She has bright affect today. She has good insight into herself. She genuinely appears motivated and happy to get help. She requests discharge to go home to her baby. She denies SI or any thoughts of self harm. We discussed Prozac and potential side effects, particularly manic symptosm and if this were to occur to return to the ED for evaluation. We also discussed safety plan in detail and she agrees to return to ED if she has thoughts of harming herself. She does not meet [place] IVC criteria.   The patient is at low risk of imminent suicide. Patient denied thoughts, intent, or plan for harm to self or others, expressed significant future orientation, and expressed an ability to mobilize assistance for her needs. She is presently void of any contributing psychiatric symptoms, cognitive difficulties, or substance use which would elevate her risk for lethality. Chronic risk for lethality is elevated in light of family conflict. The chronic risk is presently mitigated by her ongoing desire and engagement in [facility] treatment and mobilization of support from family and friends. Chronic risk may elevate if she experiences any significant loss or worsening of symptoms, which can be managed and monitored through outpatient providers. At this time,a cute risk for lethality is low and she is stable for ongoing outpatient management.   Modifiable risk factors were addressed during this hospitalization through appropriate pharmacotherapy and establishment of outpatient follow-up treatment. Some risk factors for suicide are situational (i.e. Unstable housing) or  related personality pathology (i.e. Poor coping mechanisms) and thus cannot be further mitigated by continued hospitalization in this setting.    Physical Findings: AIMS: Facial and Oral Movements Muscles of Facial Expression: None, normal Lips and Perioral Area: None, normal Jaw: None, normal Tongue: None, normal,Extremity Movements Upper (arms, wrists, hands, fingers): None, normal Lower (legs, knees, ankles, toes): None, normal, Trunk Movements Neck, shoulders, hips: None, normal, Overall Severity Severity of abnormal movements (highest score from questions above): None, normal Incapacitation due to abnormal movements: None, normal Patient's awareness of abnormal movements (rate only patient's report): No Awareness, Dental Status Current problems with teeth and/or dentures?: No Does patient usually wear dentures?: No  CIWA:    COWS:     Musculoskeletal: Strength & Muscle Tone: within normal limits Gait & Station: normal Patient leans: N/A  Psychiatric Specialty Exam: Physical Exam  Nursing note and vitals reviewed.   Review of Systems  All  other systems reviewed and are negative.   Blood pressure 90/65, pulse (!) 102, temperature 98.7 F (37.1 C), temperature source Oral, resp. rate 18, height 6' (1.829 m), weight 113.9 kg, last menstrual period 04/13/2018, SpO2 96 %, not currently breastfeeding.Body mass index is 34.04 kg/m.  General Appearance: Casual  Eye Contact:  Good  Speech:  Clear and Coherent  Volume:  Normal  Mood:  Euthymic  Affect:  Congruent  Thought Process:  Coherent and Goal Directed  Orientation:  Full (Time, Place, and Person)  Thought Content:  Logical  Suicidal Thoughts:  No  Homicidal Thoughts:  No  Memory:  Immediate;   Fair  Judgement:  Good  Insight:  Good  Psychomotor Activity:  Normal  Concentration:  Concentration: Fair  Recall:  [place] of Knowledge:  Fair  Language:  Fair  Akathisia:  No      Assets:  Resilience  ADL's:   Intact  Cognition:  WNL  Sleep:  Number of Hours: 7.3     Have you used any form of tobacco in the last 30 days? (Cigarettes, Smokeless Tobacco, Cigars, and/or Pipes): No  Has this patient used any form of tobacco in the last 30 days? (Cigarettes, Smokeless Tobacco, Cigars, and/or Pipes) No  Blood Alcohol level:  Lab Results  Component Value Date   ETH <10 24/26/8341    Metabolic Disorder Labs:  Lab Results  Component Value Date   HGBA1C 13.9 (H) 04/27/2018   MPG 352.23 04/27/2018   MPG 188.64 09/19/2017   No results found for: PROLACTIN Lab Results  Component Value Date   CHOL 185 04/27/2018   TRIG 56 04/27/2018   HDL 57 04/27/2018   CHOLHDL 3.2 04/27/2018   VLDL 11 04/27/2018   LDLCALC 117 (H) 04/27/2018    See Psychiatric Specialty Exam and Suicide Risk Assessment completed by Attending Physician prior to discharge.  Discharge destination:  Home  Is patient on multiple antipsychotic therapies at discharge:  No   Has Patient had three or more failed trials of antipsychotic monotherapy by history:  No  Recommended Plan for Multiple Antipsychotic Therapies: NA  Discharge Instructions    Increase activity slowly   Complete by:  As directed      Allergies as of 04/28/2018   No Known Allergies     Medication List    STOP taking these medications   dicloxacillin 500 MG capsule Commonly known as:  DYNAPEN   predniSONE 50 MG tablet Commonly known as:  DELTASONE   sertraline 50 MG tablet Commonly known as:  ZOLOFT     TAKE these medications     Indication  blood glucose meter kit and supplies Kit Dispense based on patient and insurance preference. Use up to four times daily as directed. (FOR ICD-9 250.00, 250.01).  Indication:  Diabetes   FLUoxetine 20 MG capsule Commonly known as:  PROZAC Take 1 capsule (20 mg total) by mouth daily. Start taking on:  04/29/2018  Indication:  Depression, Generalized Anxiety Disorder   glucose blood test strip Use  as instructed  Indication:  Diabetes   hydrOXYzine 25 MG tablet Commonly known as:  ATARAX/VISTARIL Take 1 tablet (25 mg total) by mouth 3 (three) times daily as needed for anxiety.  Indication:  Feeling Anxious   insulin NPH-regular Human (70-30) 100 UNIT/ML injection Inject 10 Units into the skin 2 (two) times daily with a meal.  Indication:  Type 2 Diabetes   Insulin Syringe-Needle U-100 30G X 5/16" 0.5 ML Misc  Use with insulin BID with meals  Indication:  Diabetes   metFORMIN 1000 MG tablet Commonly known as:  GLUCOPHAGE Take 1 tablet (1,000 mg total) by mouth 2 (two) times daily with a meal.  Indication:  Type 2 Diabetes      Follow-up Information    [place] on 04/30/2018.   Why:  Please follow up at [facility] on Wednesday, April 30, 2018 at 715 am. Please bring photo ID, medication and insurance. Thank you. Contact information: [place] [address] [phone]        [place]. Go in 1 week(s).   Specialty:  Primary Care Why:  For diabetes managment. You will need to fill out application to get services. Contact information: [place] [phone]            Signed: Marylin Crosby, MD 04/28/2018, 1:20 PM

## 2018-04-28 NOTE — Progress Notes (Addendum)
Inpatient Diabetes Program Recommendations  AACE/ADA: New Consensus Statement on Inpatient Glycemic Control (2019)  Target Ranges:  Prepandial:   less than 140 mg/dL      Peak postprandial:   less than 180 mg/dL (1-2 hours)      Critically ill patients:  140 - 180 mg/dL   Results for Samantha Bowers, Samantha Bowers (MRN [id number]) as of 04/28/2018 09:26  Ref. Range 04/27/2018 07:09 04/27/2018 11:37 04/27/2018 16:14 04/27/2018 20:41 04/28/2018 06:54  Glucose-Capillary Latest Ref Range: 70 - 99 mg/dL 829 (H) 562 (H) 130 (H) 271 (H) 260 (H)  Results for Samantha Bowers, Samantha Bowers (MRN [id number]) as of 04/28/2018 09:26  Ref. Range 07/12/2017 09:37 09/19/2017 15:48 04/27/2018 07:25  Hemoglobin A1C Latest Ref Range: 4.8 - 5.6 % 7.3 (H) 8.2 (H) 13.9 (H)   Review of Glycemic Control  Diabetes history: DM2 Outpatient Diabetes medications: Metformin 1000 mg BID Current orders for Inpatient glycemic control: Metformin 1000 mg BID, Novolog 0-9 units TID with meals  Inpatient Diabetes Program Recommendations:  Insulin - Basal: Please consider ordering 70/30 10 units BID (with breakfast and supper) which will provide a total of 14 units for basal and 6 units for meal coverage per day. Correction (SSI): Please consider ordering Novolog 0-5 units QHS. HgbA1C: A1C 13.9% on 04/27/18 indicating an average glucose of 352 mg/dl. Noted patient has not been taking Metformin lately. Metformin alone will not be adequate for DM control. Patient has used insulin in the past (during pregnancy) and recommend patient be discharged on insulin.  NOTE: Noted consult for Diabetes Coordinator. Chart reviewed. Noted patient has DM2 hx and is prescribed Metformin 1000 mg BID for DM control as an outpatient. Noted patient was pregnant this year and delivered on 10/10/17. During pregnancy, patient was taking NPH and Humalog. After delivery, per discharge note on 10/12/17, patient was discharged on Metformin 1000 mg BID. Anticipate Metformin alone will  not be adequate for DM control especially since A1C 13.9% on 04/27/18. Would recommend patient be started on insulin as an inpatient and discharged on insulin as well. If patient does not have medical insurance, will need an affordable insulin (such as NOVOLIN 70/30 which can be purchased at [facility] for $25 per vial). Will plan to talk with patient today.  Addendum 04/28/18@10 :45-Spoke with patient about diabetes and home regimen for diabetes control. Patient reports that she does not have a PCP and she has not seen a provider for DM since she delivered her baby in May 2019. Patient confirms that she took NPH and Humalog during pregnancy and she was prescribed Metformin 1000 mg BID after delivery.  Patient confirms that she has not been taking the Metformin. Patient states that she has Medicaid family planning insurance but she does not think it will cover DM medications. Discussed A1C results (13.9% on 04/27/2018) and explained that her current A1C indicates an average glucose of 352 mg/dl over the past 2-3 months. Discussed glucose and A1C goals. Discussed importance of checking CBGs and maintaining good CBG control to prevent long-term and short-term complications. Explained to patient that she will need additional DM medications for DM control as the Metformin alone will not be enough to get DM well controlled. Informed patient that it is being recommended to discharge her on 70/30 insulin. Patient is agreeable to taking 70/30 insulin as an outpatient.  Informed patient that Novolin 70/30 can be purchased at [facility] for $25 per vial.  Discussed 70/30 insulin in more detail and explained how it is taken with breakfast and  supper.  Patient states that she has an Accucheck glucometer at home but she is not sure if she has trest strips. Explained that usually name brand glucometer test strips are about $1 per strip. Explained that if she is going to have to purchase test strips, it is recommended that she get a  more affordable glucometer and testing supplies.  Provided patient with handout information on Reli-On products and encouraged patient to go to [facility] to get the Reli-On Prime glucometer for $9 and a box of 50 Reli-On test strips for $9.  Asked patient to check her glucose at least 2 times per day and to keep a log book of glucose readings. Explained how the doctor she follows up with can use the log book to continue to make insulin adjustments if needed. Patient reports that she has used both insulin vial/syringe and insulin pens in the past. Had patient demonstrated how to draw up and administer insulin with vial and syringe. Patient was able to successfully demonstrate how to draw up and administer insulin with vial and syringe. Patient states that she plans to try to make [facility]. Informed patient that if she could not afford to see a provider at the [facility], it is recommended that she contact [facility] and establish care there.  Stressed importance of follow up especially being restarted on insulin.  Patient verbalized understanding of information discussed and she states that she has no further questions at this time related to diabetes.   Thanks, Orlando PennerMarie Beauty Pless, RN, MSN, CDE Diabetes Coordinator Inpatient Diabetes Program [phone] (Team Pager from 8am to 5pm)

## 2018-04-28 NOTE — Plan of Care (Signed)
Pt. Verbalizes understanding of provided education. Pt. Denies si/hi, contracts for safety. Pt. Reports normal mood, affect is also brighter. Pt. Reports improved self coping and is motivated to better upon discharge. Pt. Reports new medications are helping. Pt. Is complaint with medications.    Problem: Education: Goal: Knowledge of [place] [facility] Education information/materials will improve Outcome: Progressing Goal: Emotional status will improve Outcome: Progressing Goal: Mental status will improve Outcome: Progressing   Problem: Coping: Goal: Ability to demonstrate self-control will improve Outcome: Progressing   Problem: Health Behavior/Discharge Planning: Goal: Compliance with treatment plan for underlying cause of condition will improve Outcome: Progressing   Problem: Safety: Goal: Ability to disclose and discuss suicidal ideas will improve Outcome: Progressing   Problem: Safety: Goal: Ability to remain free from injury will improve Outcome: Progressing

## 2018-06-04 ENCOUNTER — Telehealth: Payer: Self-pay | Admitting: Pharmacy Technician

## 2018-06-04 NOTE — Telephone Encounter (Signed)
Patient failed to provide proof of income.  No additional medication assistance will be provided by [facility] without the required proof of income documentation.  Patient notified by letter.  [name] J. Anyjah Roundtree Care Manager [facility] 

## 2018-07-28 ENCOUNTER — Other Ambulatory Visit: Payer: Self-pay

## 2018-07-28 ENCOUNTER — Encounter: Payer: Self-pay | Admitting: *Deleted

## 2018-07-28 ENCOUNTER — Emergency Department
Admission: EM | Admit: 2018-07-28 | Discharge: 2018-07-28 | Disposition: A | Discharge status: Home / Self Care | Payer: Medicaid Other | Attending: Emergency Medicine | Admitting: Emergency Medicine

## 2018-07-28 DIAGNOSIS — N939 Abnormal uterine and vaginal bleeding, unspecified: Secondary | ICD-10-CM | POA: Insufficient documentation

## 2018-07-28 DIAGNOSIS — N898 Other specified noninflammatory disorders of vagina: Secondary | ICD-10-CM | POA: Diagnosis not present

## 2018-07-28 DIAGNOSIS — E1165 Type 2 diabetes mellitus with hyperglycemia: Secondary | ICD-10-CM | POA: Diagnosis not present

## 2018-07-28 DIAGNOSIS — F419 Anxiety disorder, unspecified: Secondary | ICD-10-CM | POA: Diagnosis not present

## 2018-07-28 DIAGNOSIS — Z9114 Patient's other noncompliance with medication regimen: Secondary | ICD-10-CM | POA: Diagnosis not present

## 2018-07-28 DIAGNOSIS — Z79899 Other long term (current) drug therapy: Secondary | ICD-10-CM | POA: Diagnosis not present

## 2018-07-28 DIAGNOSIS — E119 Type 2 diabetes mellitus without complications: Secondary | ICD-10-CM

## 2018-07-28 DIAGNOSIS — N946 Dysmenorrhea, unspecified: Secondary | ICD-10-CM | POA: Diagnosis not present

## 2018-07-28 DIAGNOSIS — Z91199 Patient's noncompliance with other medical treatment and regimen due to unspecified reason: Secondary | ICD-10-CM

## 2018-07-28 DIAGNOSIS — Z794 Long term (current) use of insulin: Secondary | ICD-10-CM | POA: Diagnosis not present

## 2018-07-28 DIAGNOSIS — Z9119 Patient's noncompliance with other medical treatment and regimen: Secondary | ICD-10-CM

## 2018-07-28 LAB — URINALYSIS, COMPLETE (UACMP) WITH MICROSCOPIC
Bilirubin Urine: NEGATIVE
Glucose, UA: >=500 mg/dL — AB
Ketones, ur: NEGATIVE mg/dL
LEUKOCYTE UA: NEGATIVE
Nitrite: NEGATIVE
Protein, ur: NEGATIVE mg/dL
Specific Gravity, Urine: 1.039 — ABNORMAL HIGH (ref 1.005–1.030)
pH: 6 (ref 5.0–8.0)

## 2018-07-28 LAB — CHLAMYDIA/NGC RT PCR (ARMC ONLY)
Chlamydia Tr: NOT DETECTED
N gonorrhoeae: NOT DETECTED

## 2018-07-28 LAB — WET PREP, GENITAL
Clue Cells Wet Prep HPF POC: NONE SEEN
Sperm: NONE SEEN
Trich, Wet Prep: NONE SEEN
Yeast Wet Prep HPF POC: NONE SEEN

## 2018-07-28 LAB — POCT PREGNANCY, URINE: Preg Test, Ur: NEGATIVE

## 2018-07-28 LAB — GLUCOSE, CAPILLARY: Glucose-Capillary: 336 mg/dL — ABNORMAL HIGH (ref 70–99)

## 2018-07-28 MED ORDER — METFORMIN HCL 500 MG PO TABS: 500.0000 mg | ORAL_TABLET | Freq: Two times a day (BID) | ORAL | 1 refills | Status: DC

## 2018-07-28 NOTE — ED Triage Notes (Signed)
Pt states that she had her regular period which completed last week and last night she began having vaginal bleeding again which was associated with some menstrual like cramping. Bleeding consistent with amount she would have with a regular period.  Pt states that she was anxious about this due to her mother having cervical cancer and experiencing similar symptoms.  Pt has not been sexually active for over 8 months.

## 2018-07-28 NOTE — ED Notes (Signed)
Called lab again and per [facility] he is taking care of it right this minute.

## 2018-07-28 NOTE — ED Notes (Signed)
Called and checked to see if urine was sent to lab for patient to run UA. Per lab they will call me back once they check

## 2018-07-28 NOTE — ED Notes (Signed)
Pt presents to ED with c/o vaginal bleeding starting last night when she wiped after urinating. Pt states intermittently heavy. Pt states finished menstrual cycle last week. Pt states concerns over cervical cancer. NAD noted.

## 2018-07-28 NOTE — ED Provider Notes (Signed)
[facility] Emergency Department Provider Note   ____________________________________________   First MD Initiated Contact with Patient 07/28/18 1752     (approximate)  I have reviewed the triage vital signs and the nursing notes.   HISTORY  Chief Complaint Vaginal Bleeding   HPI Samantha Bowers is a 23 y.o. female presents to the ED with complaint of vaginal bleeding.  Patient states this started last evening and is intermittent.  Patient states that she finished her regular menstrual cycle last week.  Vaginal bleeding has not been heavy and she is used only 2 pads for the entire day.  She has experienced some minimal menstrual-like cramping.  She states that she initially had some vaginal itching but denies any vaginal discharge.  She has most anxious about her symptoms as her mother was recently diagnosed with cervical cancer.  Patient denies sexual activity for the last 8 months.  Patient rates her pain as a 0/10 at this time.      Past Medical History:  Diagnosis Date  . Depression   . Diabetes mellitus without complication [facility])    gestational    Patient Active Problem List   Diagnosis Date Noted  . GAD (generalized anxiety disorder) 04/28/2018  . Type 2 diabetes mellitus ([place]) 04/27/2018  . Severe recurrent major depression without psychotic features ([place]) 04/26/2018  . Depression 11/05/2017  . Mastitis during puerperium 11/05/2017  . SVD (spontaneous vaginal delivery) 10/12/2017  . Pre-existing type 2 diabetes mellitus in pregnancy 10/10/2017  . GBS (group B streptococcus) UTI complicating pregnancy 81/44/8185  . Pre-existing type 2 diabetes mellitus with hyperglycemia during pregnancy in third trimester ([place]) 07/12/2017  . Chlamydia infection complicating pregnancy 63/14/9702  . Supervision of high risk pregnancy, antepartum 07/12/2017  . Poor glycemic control 07/12/2017    Past Surgical History:  Procedure Laterality Date  . NO  PAST SURGERIES      Prior to Admission medications   Medication Sig Start Date End Date Taking? Authorizing Provider  blood glucose meter kit and supplies KIT Dispense based on patient and insurance preference. Use up to four times daily as directed. (FOR ICD-9 250.00, 250.01). 04/28/18   McNew, Tyson Babinski, MD  FLUoxetine (PROZAC) 20 MG capsule Take 1 capsule (20 mg total) by mouth daily. 04/29/18   McNew, Tyson Babinski, MD  glucose blood test strip Use as instructed 10/01/17   Katheren Shams, DO  hydrOXYzine (ATARAX/VISTARIL) 25 MG tablet Take 1 tablet (25 mg total) by mouth 3 (three) times daily as needed for anxiety. 04/28/18   McNew, Tyson Babinski, MD  insulin NPH-regular Human (NOVOLIN 70/30) (70-30) 100 UNIT/ML injection Inject 10 Units into the skin 2 (two) times daily with a meal. 04/28/18 05/28/18  McNew, Tyson Babinski, MD  Insulin Syringe-Needle U-100 (SAFETY INSULIN SYRINGES) 30G X 5/16" 0.5 ML MISC Use with insulin BID with meals 04/28/18   McNew, Tyson Babinski, MD  metFORMIN (GLUCOPHAGE) 500 MG tablet Take 1 tablet (500 mg total) by mouth 2 (two) times daily with a meal. 07/28/18 07/28/19  Johnn Hai, PA-C    Allergies Patient has no known allergies.  Family History  Problem Relation Age of Onset  . Diabetes Mother   . Diabetes Father     Social History Social History   Tobacco Use  . Smoking status: Never Smoker  . Smokeless tobacco: Never Used  Substance Use Topics  . Alcohol use: No    Frequency: Never  . Drug use: No  Review of Systems Constitutional: No fever/chills Cardiovascular: Denies chest pain. Respiratory: Denies shortness of breath. Gastrointestinal: No abdominal pain.  No nausea, no vomiting.  Genitourinary: Negative for dysuria.  Positive vaginal bleeding. Musculoskeletal: Negative for back pain. Skin: Negative for rash. Neurological: Negative for headaches, focal weakness or numbness. ___________________________________________   PHYSICAL EXAM:  VITAL SIGNS: ED Triage  Vitals  Enc Vitals Group     BP 07/28/18 1657 128/82     Pulse Rate 07/28/18 1657 76     Resp 07/28/18 1657 16     Temp 07/28/18 1657 98.9 F (37.2 C)     Temp Source 07/28/18 1657 Oral     SpO2 07/28/18 1657 100 %     Weight 07/28/18 1658 250 lb (113.4 kg)     Height --      Head Circumference --      Peak Flow --      Pain Score 07/28/18 1658 0     Pain Loc --      Pain Edu? --      Excl. in [place]? --    Constitutional: Alert and oriented. Well appearing and in no acute distress. Eyes: Conjunctivae are normal.  Head: Atraumatic. Neck: No stridor.   Cardiovascular: Normal rate, regular rhythm. Grossly normal heart sounds.  Good peripheral circulation. Respiratory: Normal respiratory effort.  No retractions. Lungs CTAB. Gastrointestinal: Soft and nontender. No distention.  Genitourinary: On exam external genitalia is unremarkable.  There is some vaginal blood present in the vaginal vault.  Swabs were obtained.  No adnexal masses or tenderness is present.  No cervical motion tenderness is present.  Pelvic exam is unremarkable. Musculoskeletal: No lower extremity tenderness nor edema.  No joint effusions. Neurologic:  Normal speech and language. No gross focal neurologic deficits are appreciated.  Skin:  Skin is warm, dry and intact. Psychiatric: Mood and affect are normal. Speech and behavior are normal.  ____________________________________________   LABS (all labs ordered are listed, but only abnormal results are displayed)  Labs Reviewed  WET PREP, GENITAL - Abnormal; Notable for the following components:      Result Value   WBC, Wet Prep HPF POC FEW (*)    All other components within normal limits  URINALYSIS, COMPLETE (UACMP) WITH MICROSCOPIC - Abnormal; Notable for the following components:   Color, Urine YELLOW (*)    APPearance CLEAR (*)    Specific Gravity, Urine 1.039 (*)    Glucose, UA >=500 (*)    Hgb urine dipstick LARGE (*)    All other components within  normal limits  GLUCOSE, CAPILLARY - Abnormal; Notable for the following components:   Glucose-Capillary 336 (*)    All other components within normal limits  CHLAMYDIA/NGC RT PCR ([facility] ONLY)  POC URINE PREG, ED  POCT PREGNANCY, URINE  CBG MONITORING, ED     PROCEDURES  Procedure(s) performed (including Critical Care):  Procedures   ____________________________________________   INITIAL IMPRESSION / ASSESSMENT AND PLAN / ED COURSE  As part of my medical decision making, I reviewed the following data within the electronic MEDICAL RECORD NUMBER Notes from prior ED visits and [place] Controlled Substance Database  Patient presents to the ED with complaint of vaginal bleeding that started last evening and has continued today.  Patient states that she is used 2 pads since the bleeding began.  She denies any vaginal discharge prior to this and states that she had some minimal vaginal itching.  She is not sexually active at this time.  She admits that she has some anxiety due to her mother recently being diagnosed with cervical cancer.  Patient also was diagnosed with gestational diabetes and currently is not on any medication nor is she checking her blood sugars.  She does not adhere to a diabetic diet.  Prior to her arrival in the ED she had [employer] sandwich along with fries and a fruit punch.  Patient's blood sugar was 336.  She was given a prescription for metformin 500 mg twice daily.  She is strongly encouraged to follow-up with [facility] or the health department.  She states that she currently is trying to obtain a PCP.  She was given information about a diabetic diet.  At the time of her discharge chlamydia and gonorrhea test were pending.  She was made aware that she would get a phone call if these were positive.  She was strongly encouraged to take her diabetes more seriously and read the information given to her.  ____________________________________________   FINAL  CLINICAL IMPRESSION(S) / ED DIAGNOSES  Final diagnoses:  Abnormal vaginal bleeding  Type 2 diabetes mellitus without complication, without long-term current use of insulin ([place])  Medically noncompliant  Uncontrolled type 2 diabetes mellitus with hyperglycemia [facility])     ED Discharge Orders         Ordered    metFORMIN (GLUCOPHAGE) 500 MG tablet  2 times daily with meals     07/28/18 2036           Note:  This document was prepared using Dragon voice recognition software and may include unintentional dictation errors.    Johnn Hai, PA-C 07/28/18 2041    Earleen Newport, MD 07/28/18 2249

## 2018-07-28 NOTE — Discharge Instructions (Addendum)
Follow-up with Dr. Feliberto Gottron at [facility] for any continued gynecology needs or you may follow-up with the [facility] department.  Information and contact phone numbers is listed on your discharge papers.  You will be called on the culture results if there is anything abnormal tomorrow.  Urinalysis did show glucose in your urine. A prescription for metformin was given to you tonight.  You need to read the information given to you about diabetic diet.  Blood sugar was elevated at 336

## 2019-09-01 IMAGING — US US FETAL BPP W/ NON-STRESS
1 series · 13 of 13 positions shown · non-contrast
Comparison: none

[Series 1: us fetal bpp w/nonstress · 13 acquisitions, 13 frames shown]
[im 1/13]
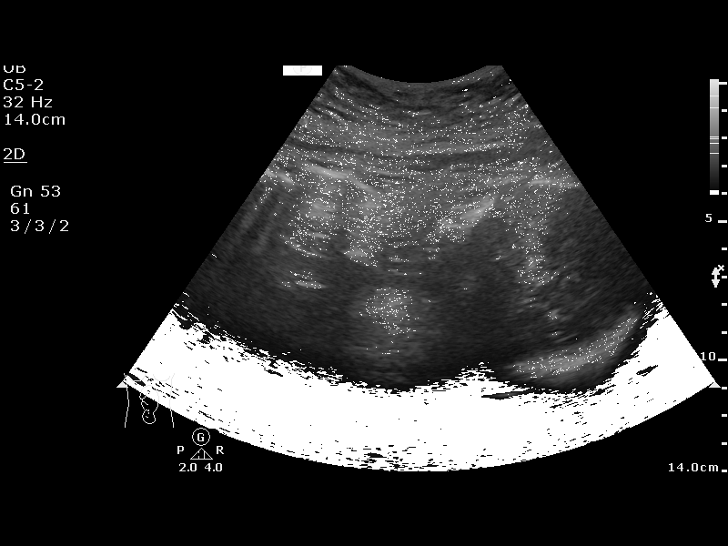
[im 2/13]
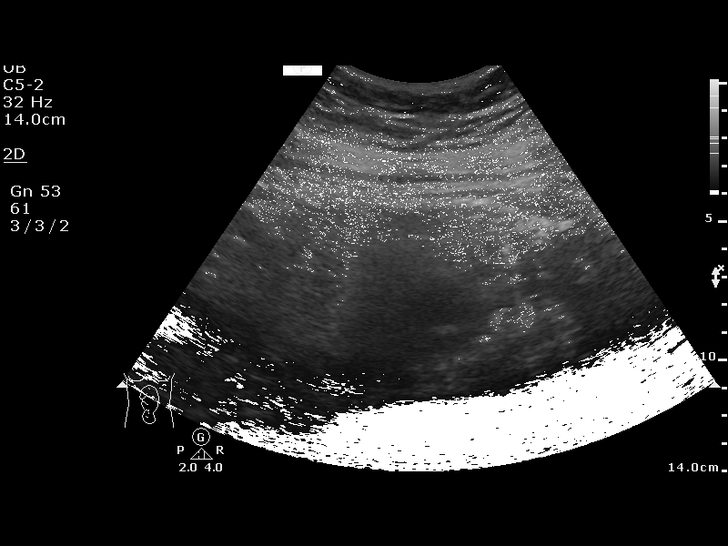
[im 3/13]
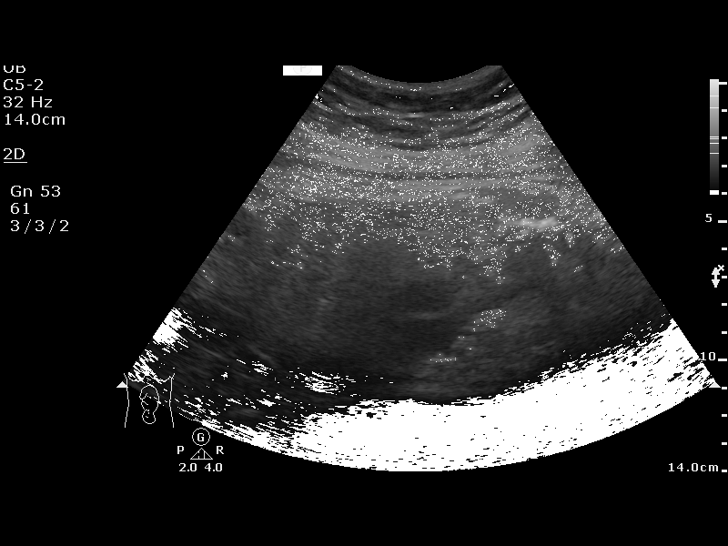
[im 4/13]
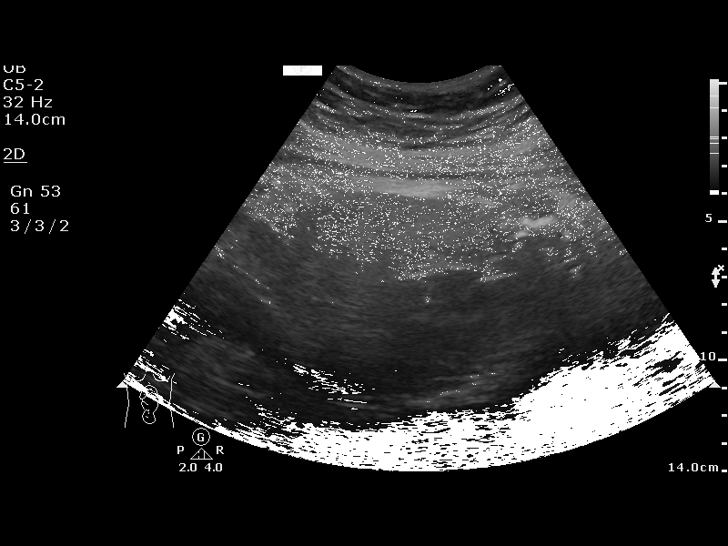
[im 5/13]
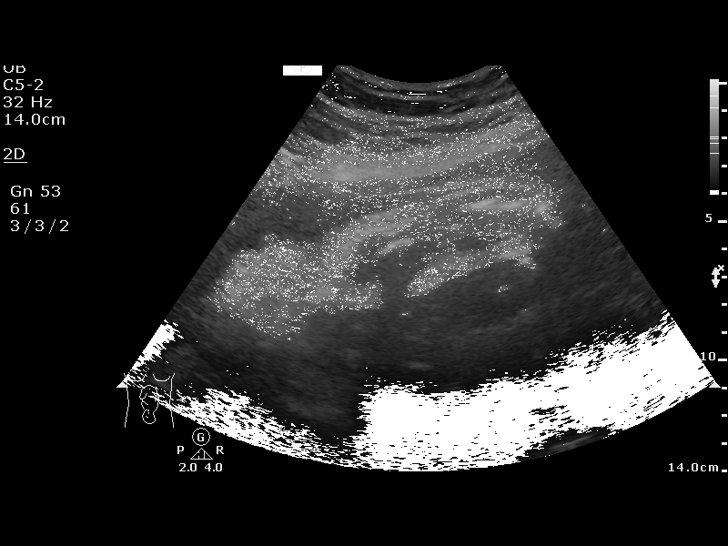
[im 6/13]
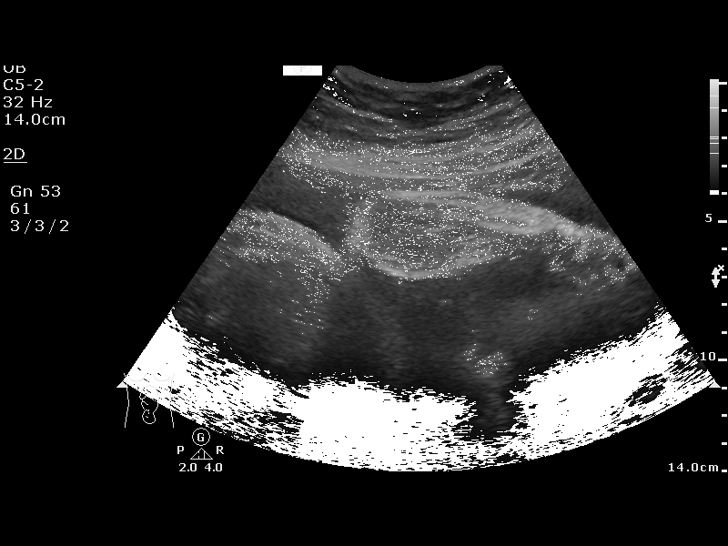
[im 7/13]
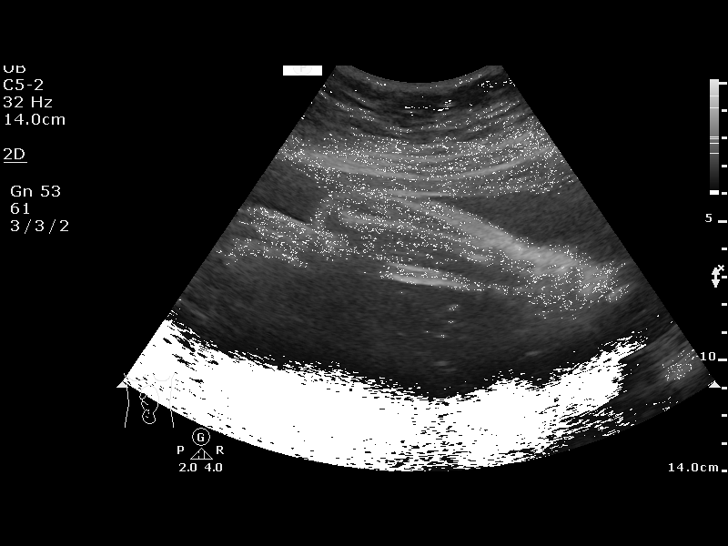
[im 8/13]
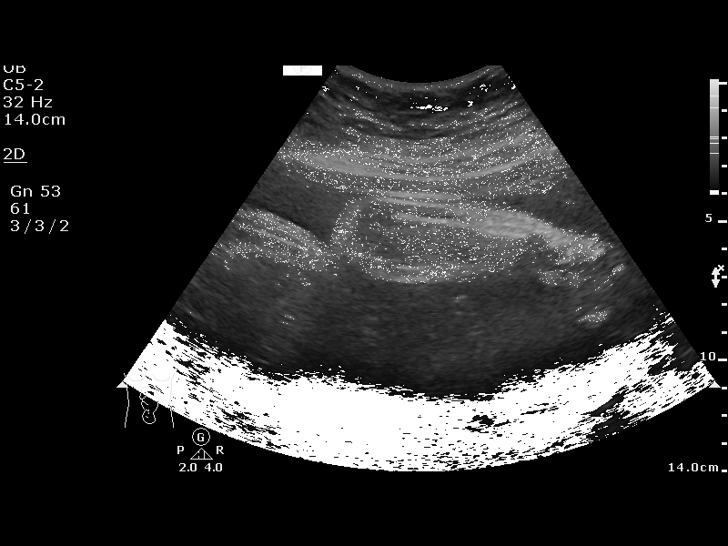
[im 9/13]
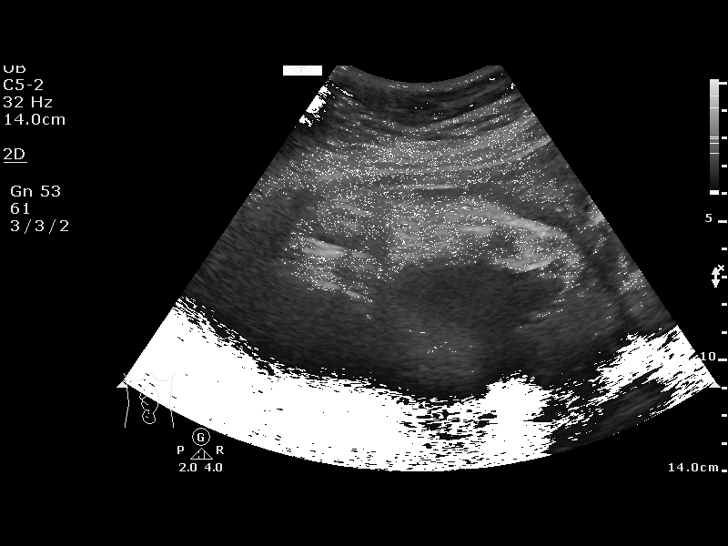
[im 10/13]
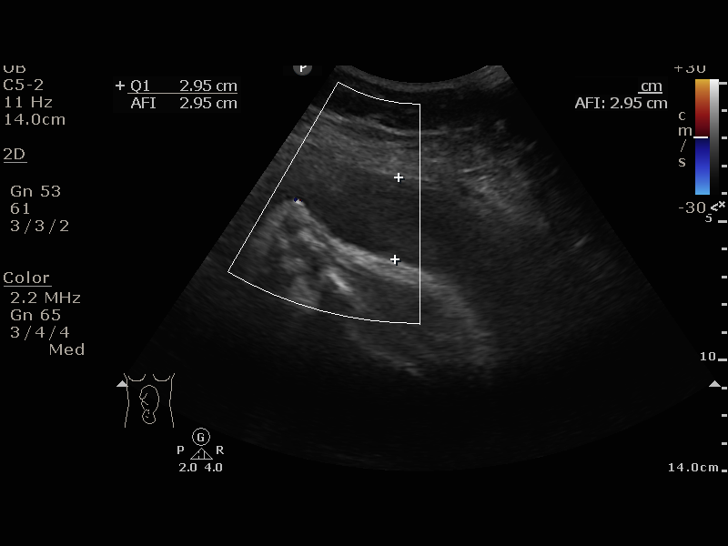
[im 11/13]
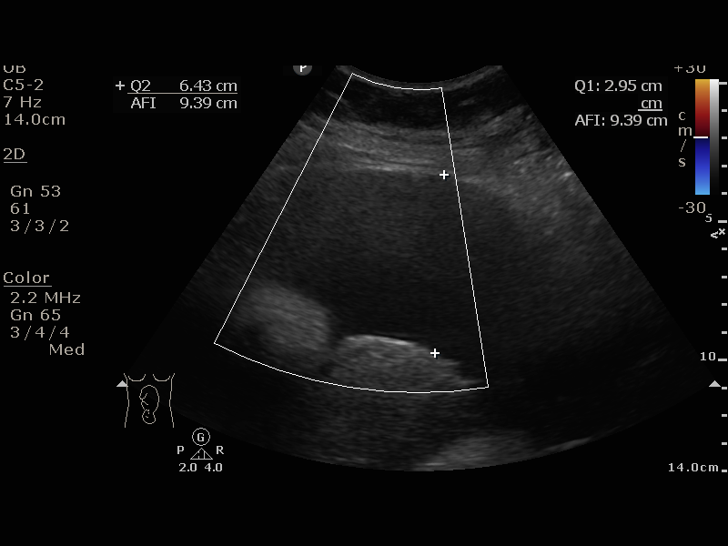
[im 12/13]
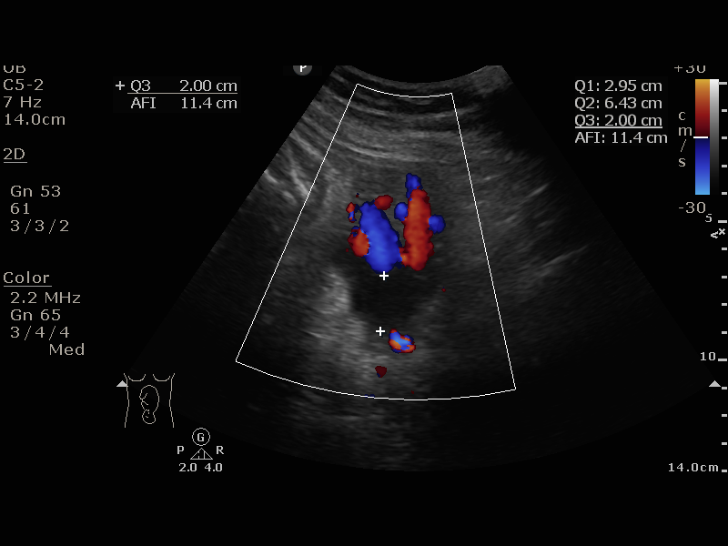
[im 13/13]
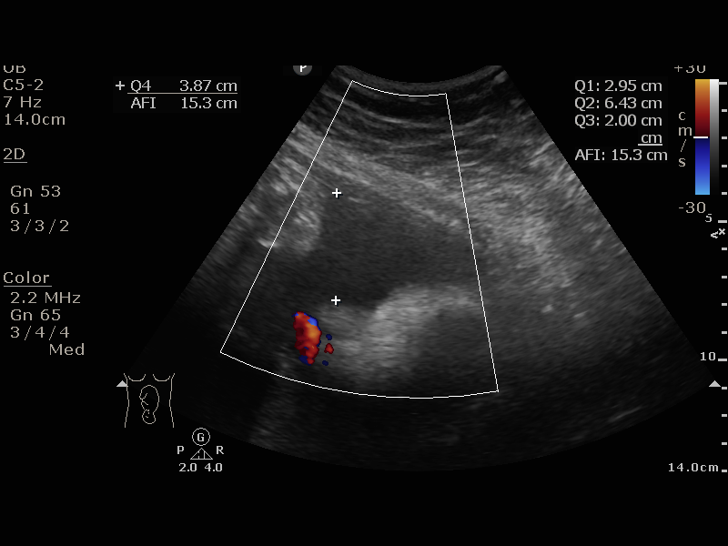

[13 of 13 positions shown; findings below may reference images not displayed]

Women's
[REDACTED]

1  US FETAL BPP W/NONSTRESS                    76818.4

1  GIORGI JUMPER             [id number]      [id number]     [id number]
Service(s) Provided

Indications

33 weeks gestation of pregnancy
Pre-existing diabetes, type 2, in pregnancy,
third trimester
OB History

Gravidity:    1
Fetal Evaluation

Num Of Fetuses:     1
Preg. Location:     Intrauterine
Cardiac Activity:   Observed
Presentation:       Cephalic

Amniotic Fluid
AFI FV:      Subjectively within normal limits

AFI Sum(cm)     %Tile       Largest Pocket(cm)
15.25           54

RUQ(cm)       RLQ(cm)       LUQ(cm)        LLQ(cm)
2.95          3.87          6.43           2
Biophysical Evaluation
Amniotic F.V:   Pocket => 2 cm two         F. Tone:        Observed
planes
F. Movement:    Observed                   N.S.T:          Reactive
F. Breathing:   Observed                   Score:          [DATE]
Gestational Age

LMP:           33w 0d       Date:   01/10/17                 EDD:   10/17/17
Best:          33w 0d    Det. By:   LMP  (01/10/17)          EDD:   10/17/17
Impression

IUP at  88w2d
Normal amniotic fluid volume
Recommendations

Continued recommended antenatal testing and prenatal care

## 2019-09-22 IMAGING — US US FETAL BPP W/ NON-STRESS
1 series · 13 of 13 positions shown · non-contrast
Comparison: none

[Series 1: us fetal bpp w/nonstress · 13 acquisitions, 13 frames shown]
[im 1/13]
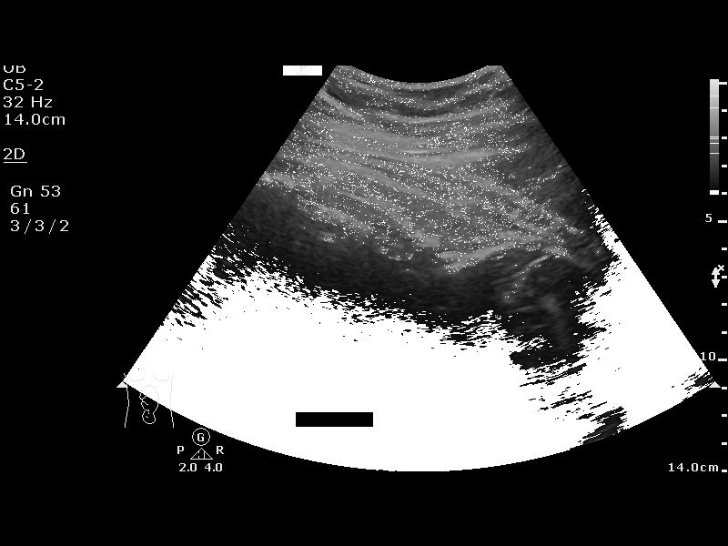
[im 2/13]
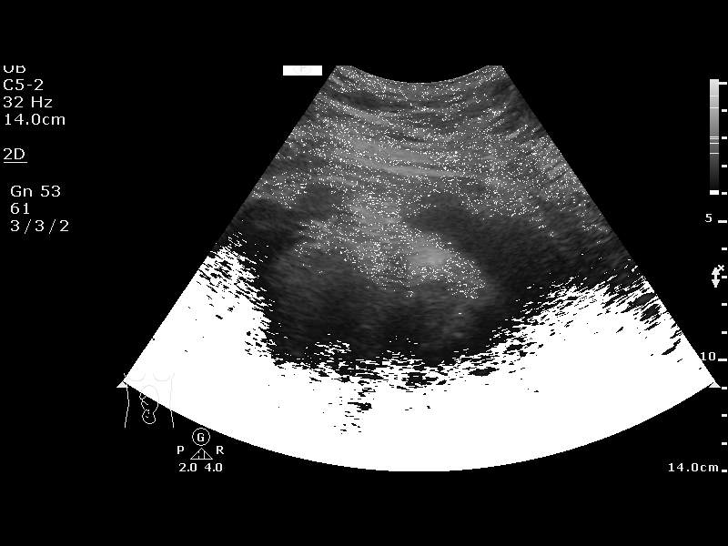
[im 3/13]
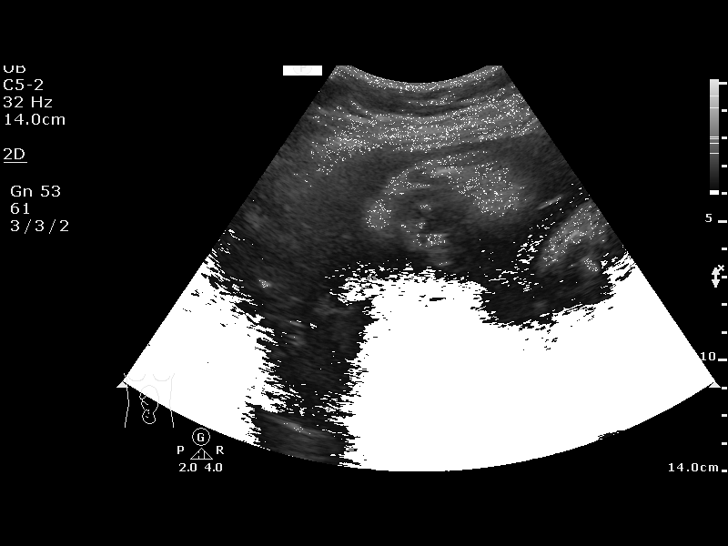
[im 4/13]
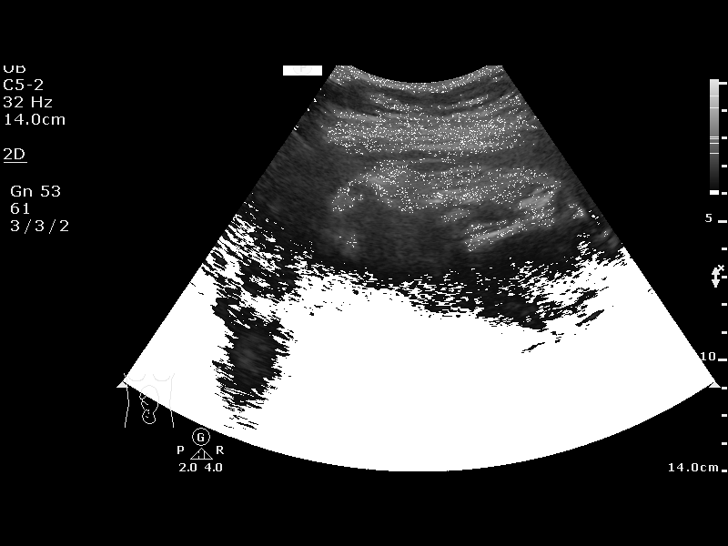
[im 5/13]
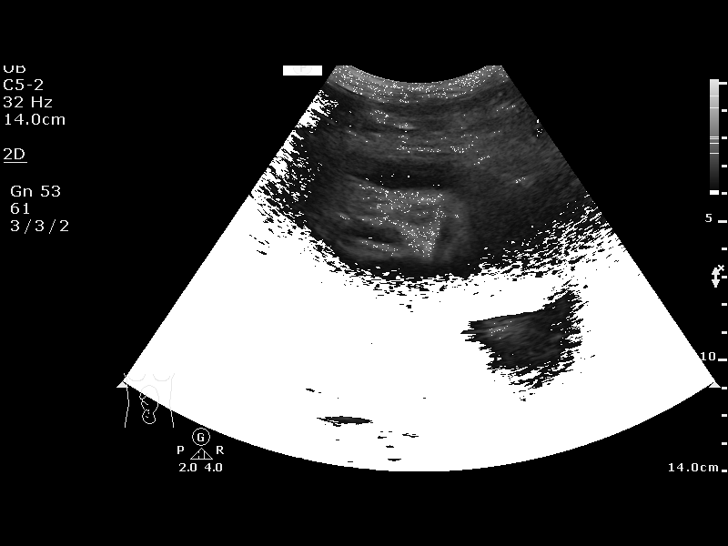
[im 6/13]
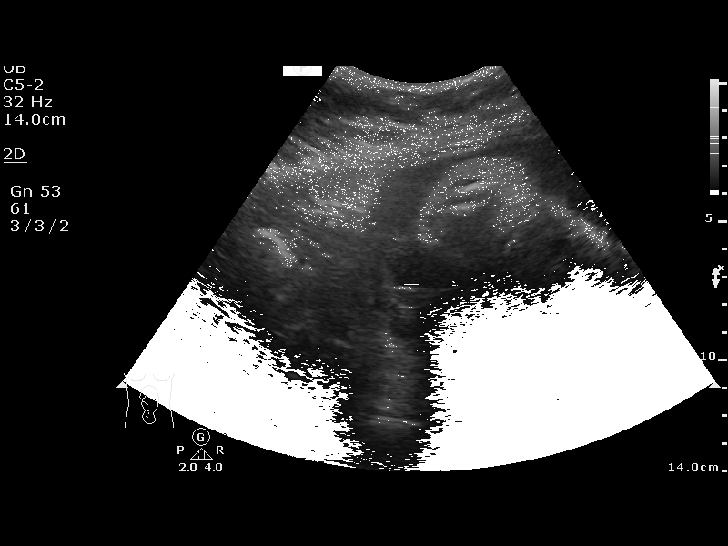
[im 7/13]
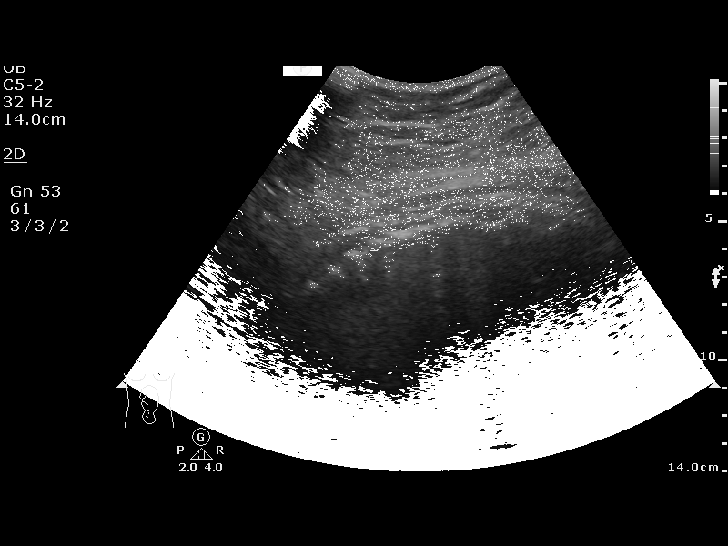
[im 8/13]
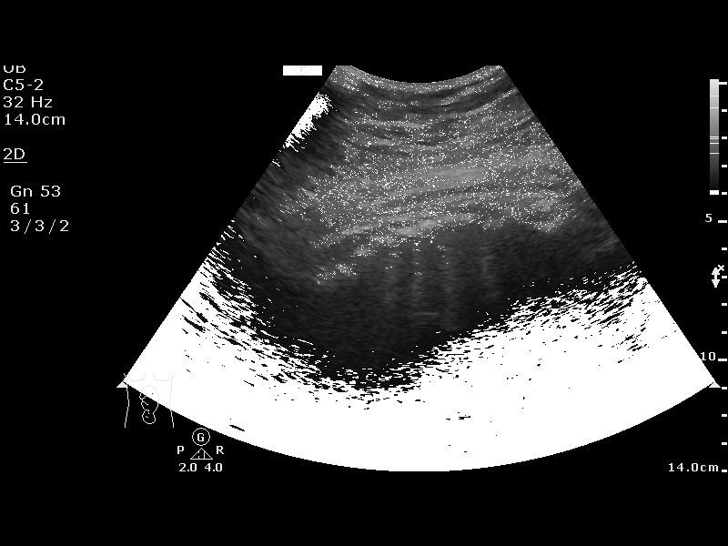
[im 9/13]
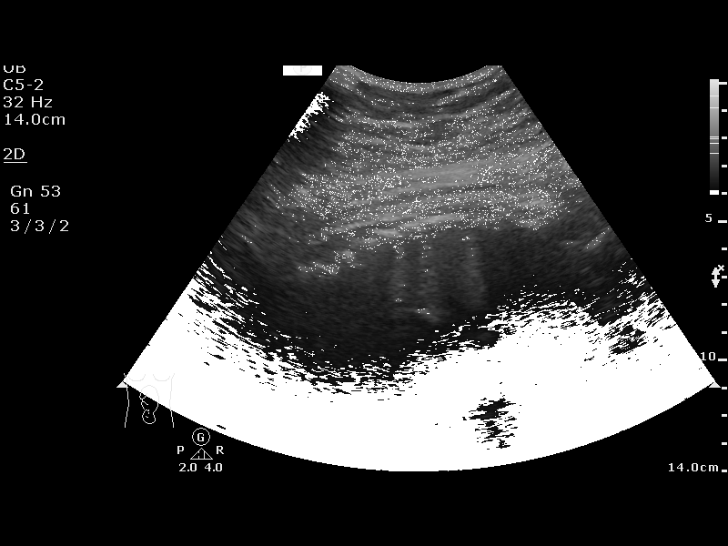
[im 10/13]
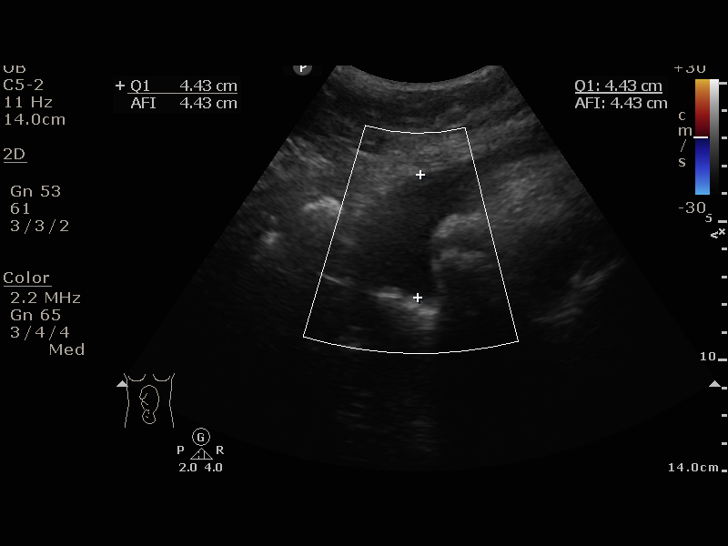
[im 11/13]
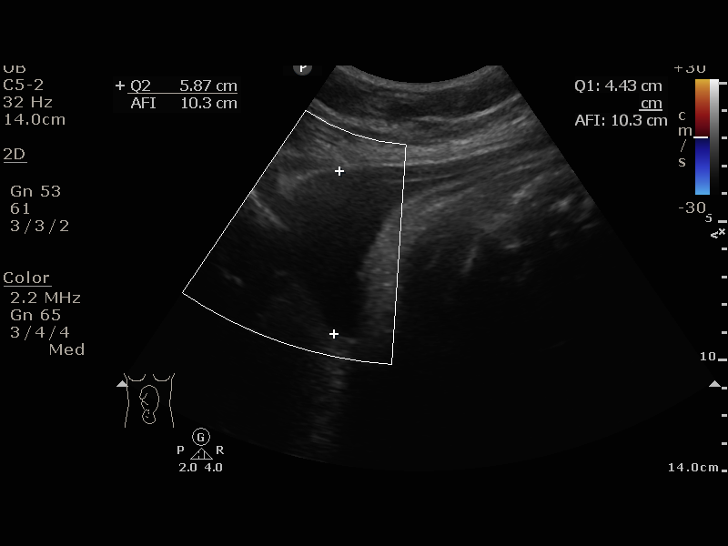
[im 12/13]
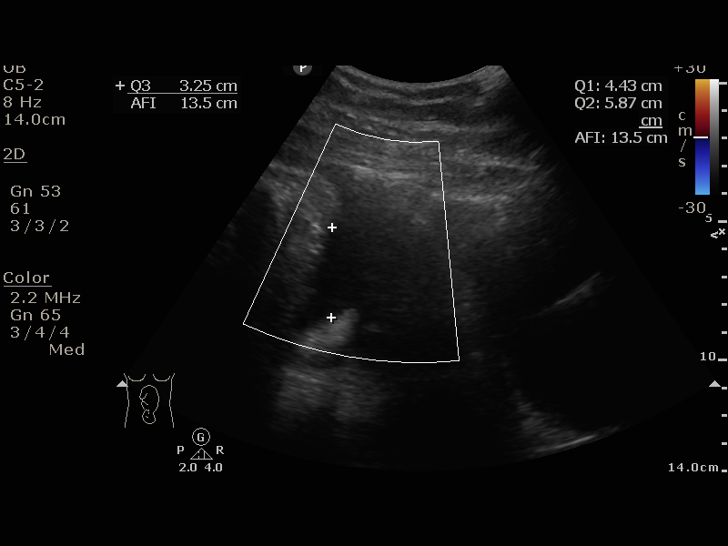
[im 13/13]
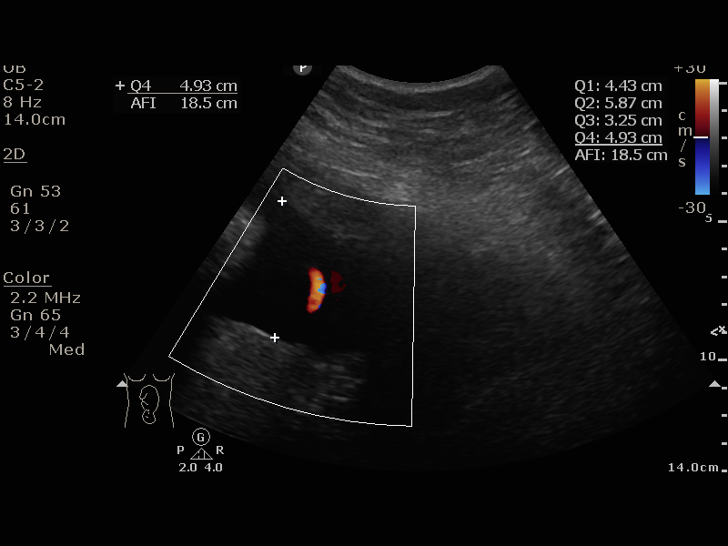

[13 of 13 positions shown; findings below may reference images not displayed]

BSN
Women's
[REDACTED]

1  US FETAL BPP W/NONSTRESS                    76818.4

1  GARRISON CHENEVERT          [id number]      [id number]     [id number]
Indications

36 weeks gestation of pregnancy
Gestational diabetes in pregnancy, insulin
controlled
OB History

Gravidity:    1
Fetal Evaluation

Num Of Fetuses:     1
Preg. Location:     Intrauterine
Cardiac Activity:   Observed
Fetal Lie:          Maternal left side
Presentation:       Cephalic

Amniotic Fluid
AFI FV:      Subjectively within normal limits

AFI Sum(cm)     %Tile       Largest Pocket(cm)
18.48           69

RUQ(cm)       RLQ(cm)       LUQ(cm)        LLQ(cm)
4.43
Biophysical Evaluation

Amniotic F.V:   Pocket => 2 cm two         F. Tone:        Observed
planes
F. Movement:    Observed                   N.S.T:          Reactive
F. Breathing:   Observed                   Score:          [DATE]
Gestational Age

LMP:           36w 0d       Date:   01/10/17                 EDD:   10/17/17
Best:          36w 0d    Det. By:   LMP  (01/10/17)          EDD:   10/17/17
Impression

Reassuring AP testing
Recommendations

Repeat one week.

## 2019-12-04 IMAGING — US US MFM OB FOLLOW-UP
1 series · 13 of 28 positions shown · non-contrast
Comparison: none

[Series 1: us mfm ob follow-up · 40 acquisitions, 13 frames shown]
[im 2/40]
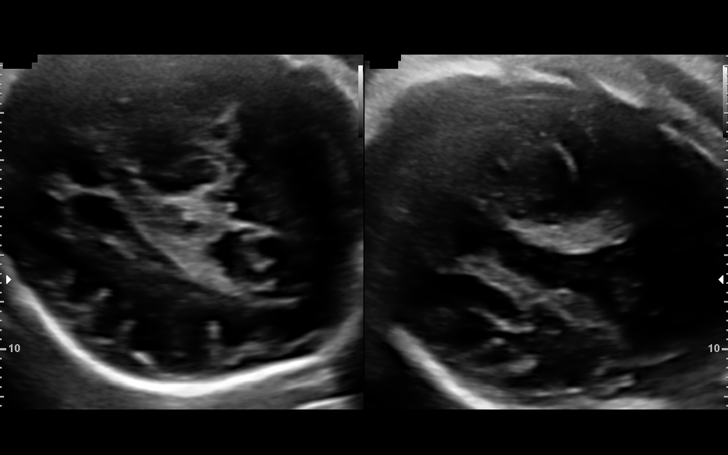
[im 5/40]
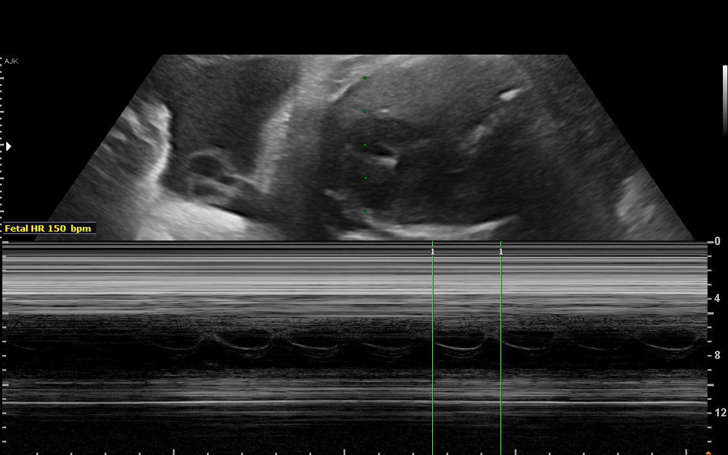
[im 8/40]
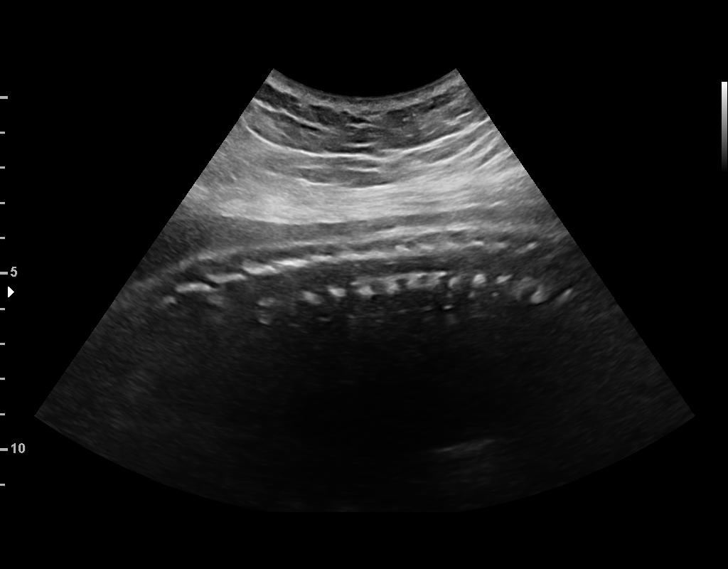
[im 11/40]
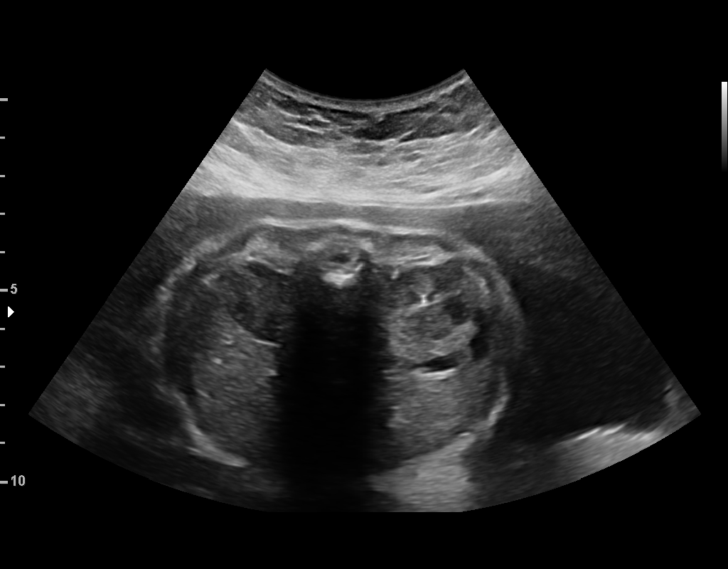
[im 14/40]
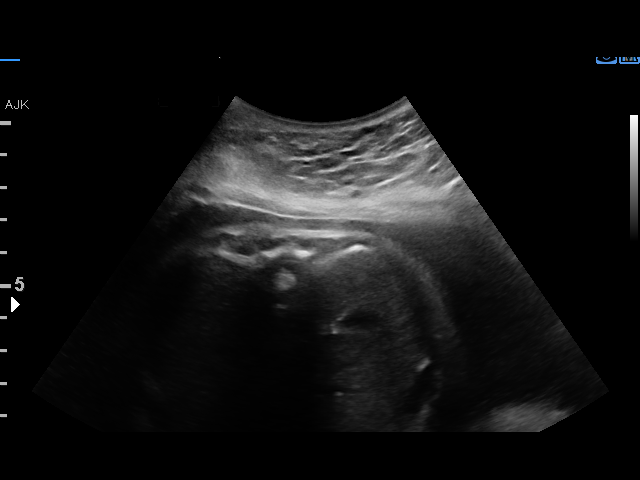
[im 16/40]
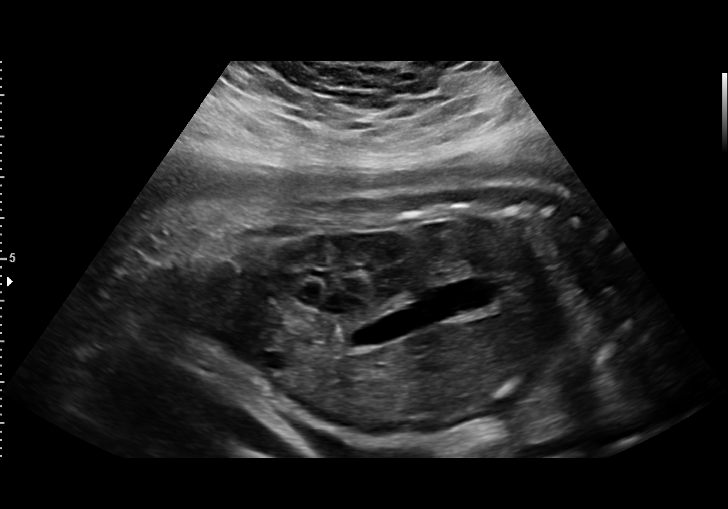
[im 21/40]
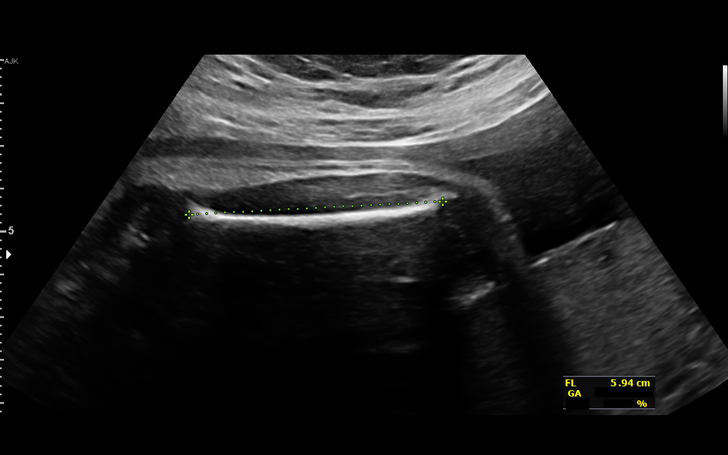
[im 24/40]
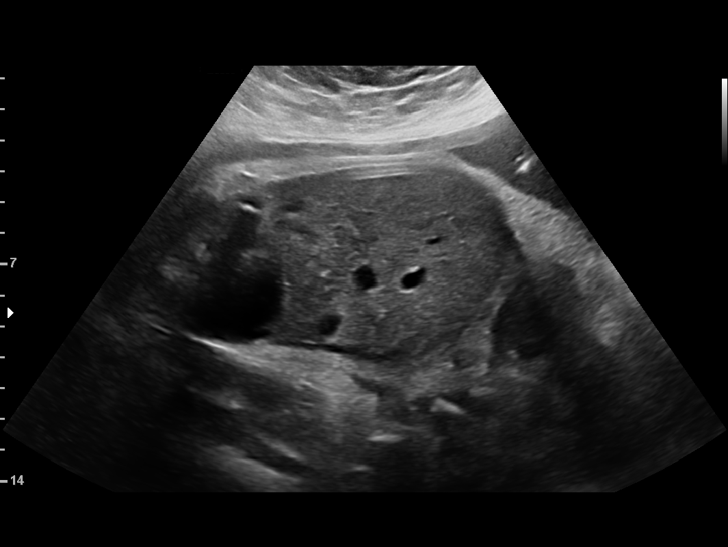
[im 27/40]
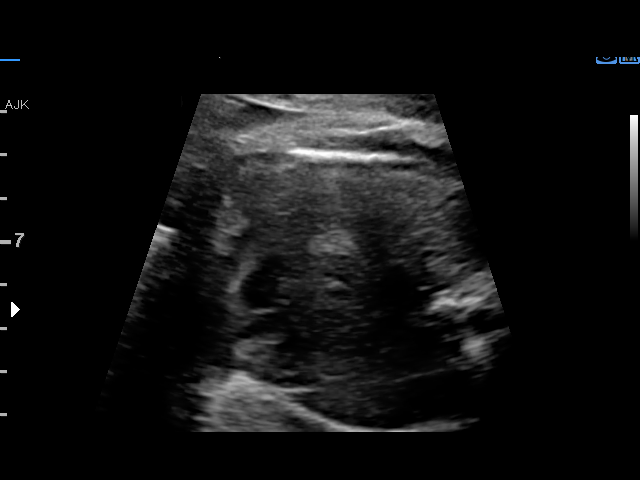
[im 29/40]
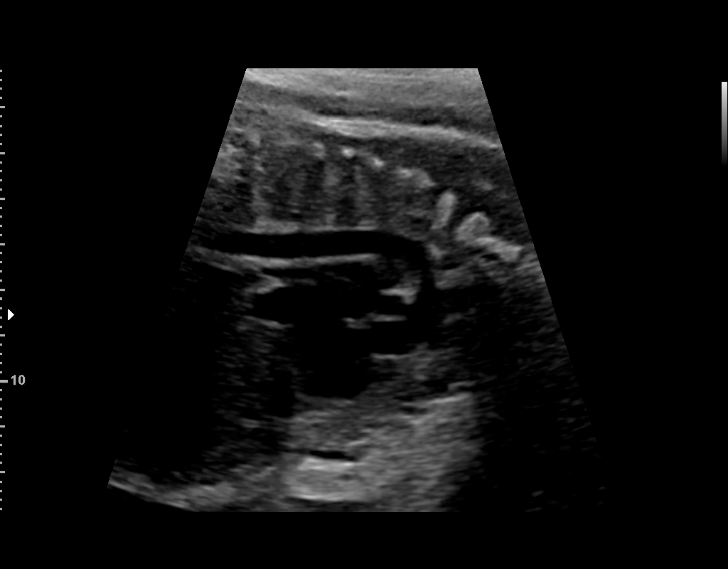
[im 32/40]
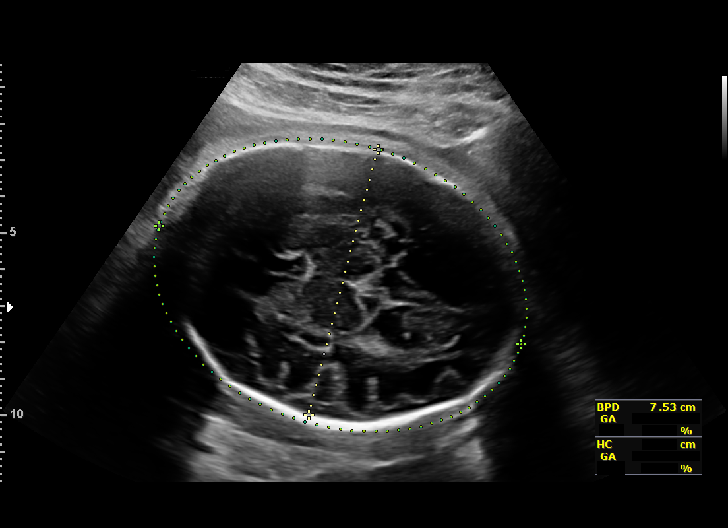
[im 35/40]
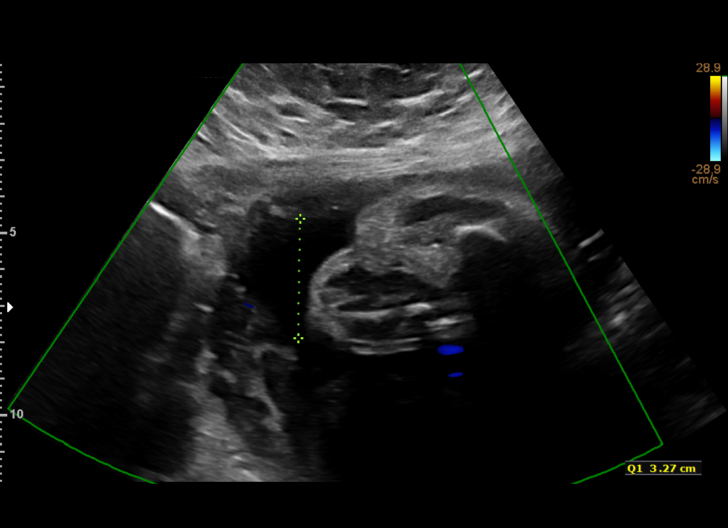
[im 38/40]
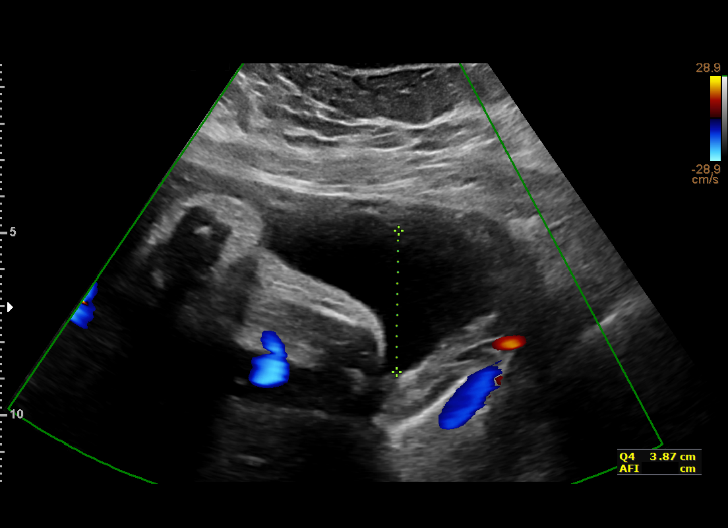

[13 of 28 positions shown; findings below may reference images not displayed]

1  ARVINDER GODSEY           [id number]      [id number]     [id number]
Indications

31 weeks gestation of pregnancy
Diabetes - Pregestational,3rd trimester
(Humalog, NPH)
Antenatal follow-up for nonvisualized fetal
anatomy
OB History

Gravidity:    1
Fetal Evaluation

Num Of Fetuses:     1
Fetal Heart         150
Rate(bpm):
Cardiac Activity:   Observed
Presentation:       Cephalic
Placenta:           Posterior, above cervical os
P. Cord Insertion:  Previously Visualized

Amniotic Fluid
AFI FV:      Subjectively within normal limits

AFI Sum(cm)     %Tile       Largest Pocket(cm)
15.92           57

RUQ(cm)       RLQ(cm)       LUQ(cm)        LLQ(cm)
3.27
Biometry
BPD:      74.4  mm     G. Age:  29w 6d          9  %    CI:        69.16   %    70 - 86
FL/HC:      20.7   %    19.3 -
HC:      285.7  mm     G. Age:  31w 3d         21  %    HC/AC:      1.02        0.96 -
AC:      281.2  mm     G. Age:  32w 1d         76  %    FL/BPD:     79.3   %    71 - 87
FL:         59  mm     G. Age:  30w 6d         26  %    FL/AC:      21.0   %    20 - 24

Est. FW:    5662  gm    3 lb 15 oz      61  %
Gestational Age

LMP:           31w 1d        Date:  01/10/17                 EDD:   10/17/17
U/S Today:     31w 1d                                        EDD:   10/17/17
Best:          31w 1d     Det. By:  LMP  (01/10/17)          EDD:   10/17/17
Anatomy

Cranium:               Appears normal         Aortic Arch:            Not well visualized
Cavum:                 Appears normal         Ductal Arch:            Not well visualized
Ventricles:            Appears normal         Diaphragm:              Appears normal
Choroid Plexus:        Appears normal         Stomach:                Appears normal, left
sided
Cerebellum:            Previously seen        Abdomen:                Appears normal
Posterior Fossa:       Previously seen        Abdominal Wall:         Previously seen
Nuchal Fold:           Not applicable (>20    Cord Vessels:           Appears normal (3
wks GA)                                        vessel cord)
Face:                  Orbits and profile     Kidneys:                Appear normal
previously seen
Lips:                  Appears normal         Bladder:                Appears normal
Thoracic:              Appears normal         Spine:                  Limited views
appear normal
Heart:                 Appears normal         Upper Extremities:      Previously seen
(4CH, axis, and situs
RVOT:                  Not well visualized    Lower Extremities:      Previously seen
LVOT:                  Not well visualized

Other:  Technically difficult due to advanced gestational age and fetal
position.
Cervix Uterus Adnexa

Cervix
Not visualized (advanced GA >63wks)

Uterus
No abnormality visualized.

Left Ovary
Not visualized.

Right Ovary
Not visualized.

Adnexa:       No abnormality visualized.
Impression

Singleton intrauterine pregnancy at 31+1 weeks with type 2
DM here for growth evaluation
Interval review of the anatomy shows no sonographic
markers for aneuploidy or structural anomalies
All relevant fetal anatomy has been visualized either with
MFM or via fetal echocardiogram
Amniotic fluid volume is normal
Estimated fetal weight shows growth in the 61st percentile
Recommendations

Recommend follow-up ultrasound examination in 4 weeks for
growth evaluation
If not already arranged, antepartum fetal testing should be
started and continued through delivery

## 2020-01-01 IMAGING — US US MFM FETAL BPP W/O NON-STRESS
1 series · 13 of 28 positions shown · non-contrast
Comparison: none

[Series 1: us mfm fetal bpp w/o non-stress · 59 acquisitions, 13 frames shown]
[im 3/59]
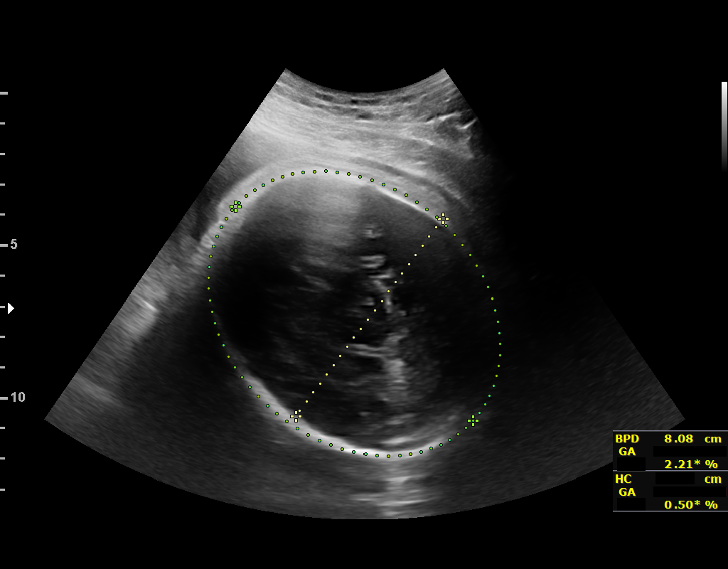
[im 7/59]
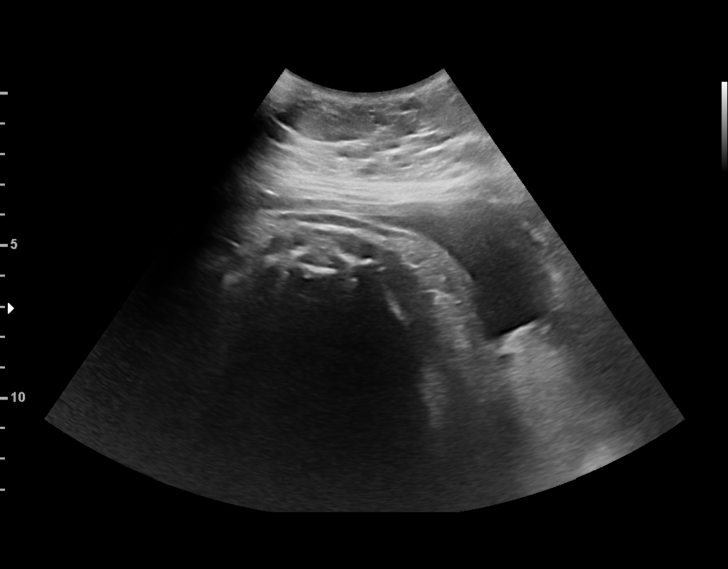
[im 11/59]
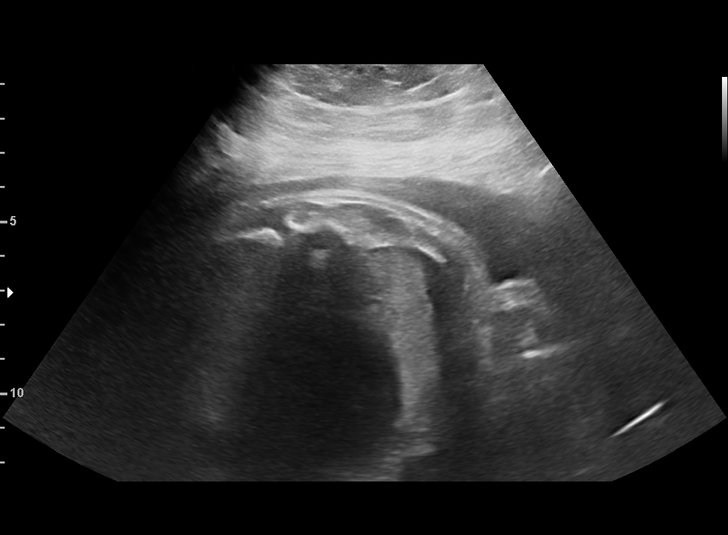
[im 16/59]
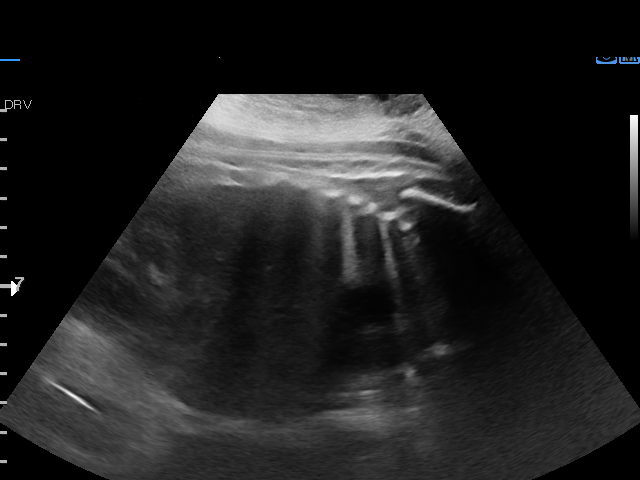
[im 20/59]
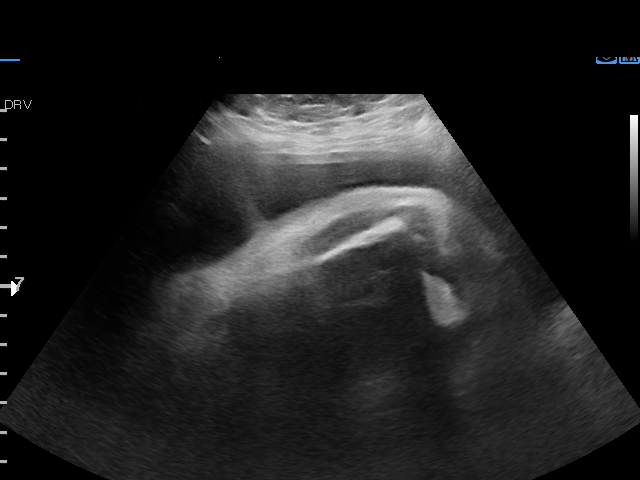
[im 24/59]
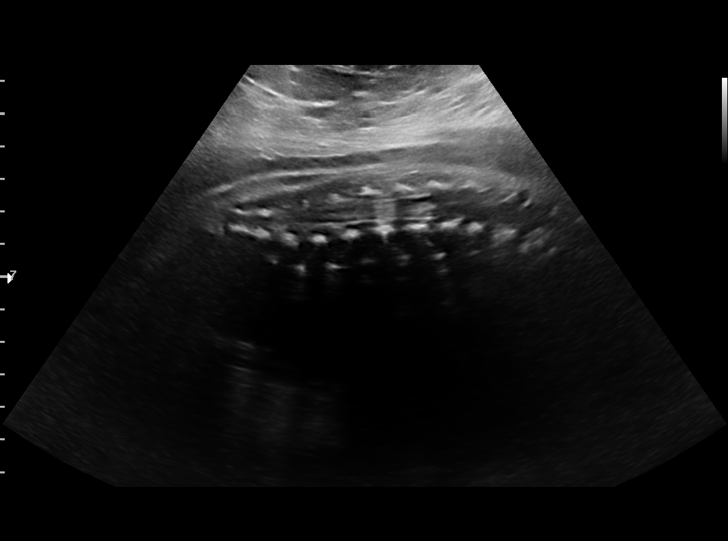
[im 31/59]
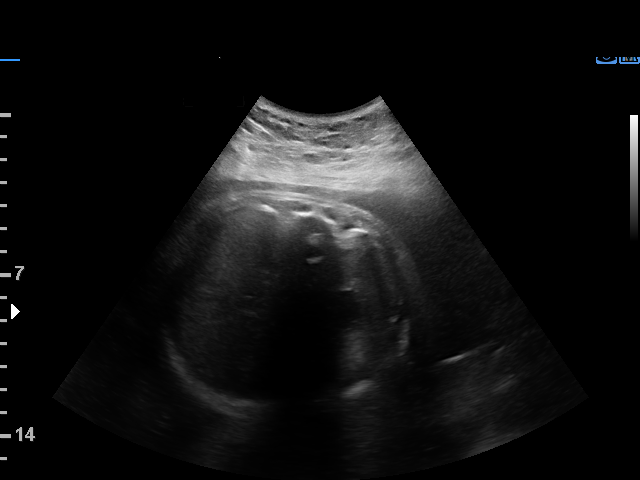
[im 35/59]
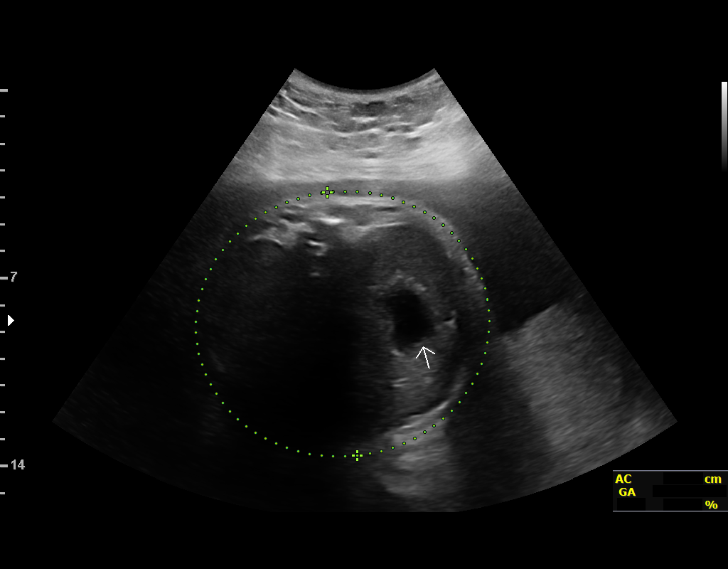
[im 39/59]
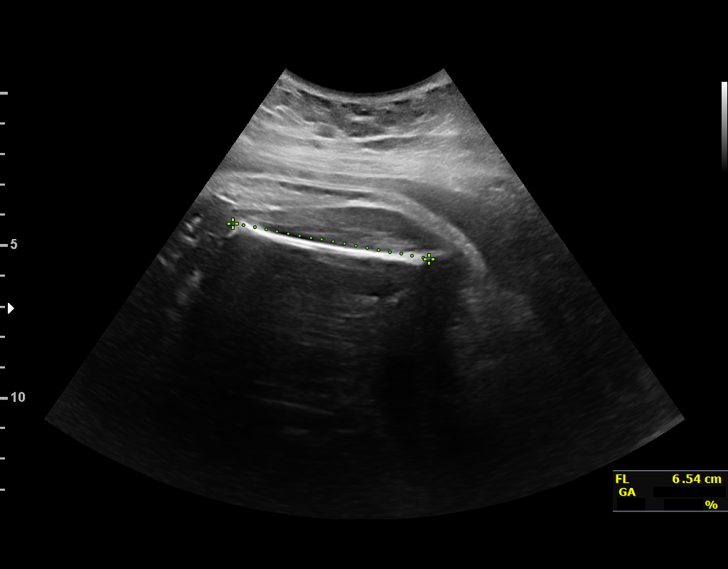
[im 43/59]
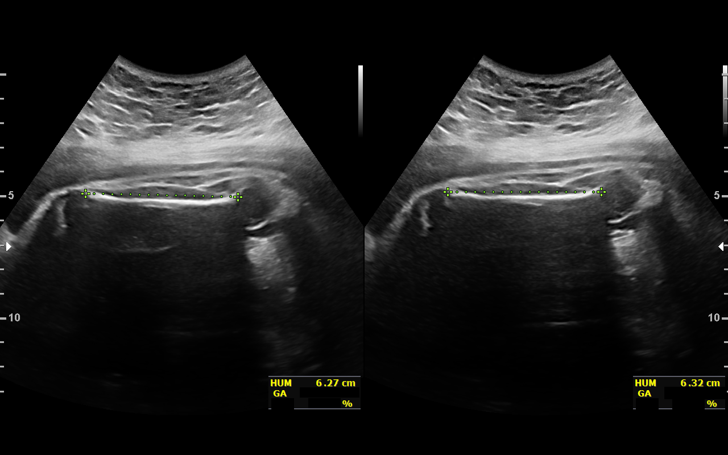
[im 48/59]
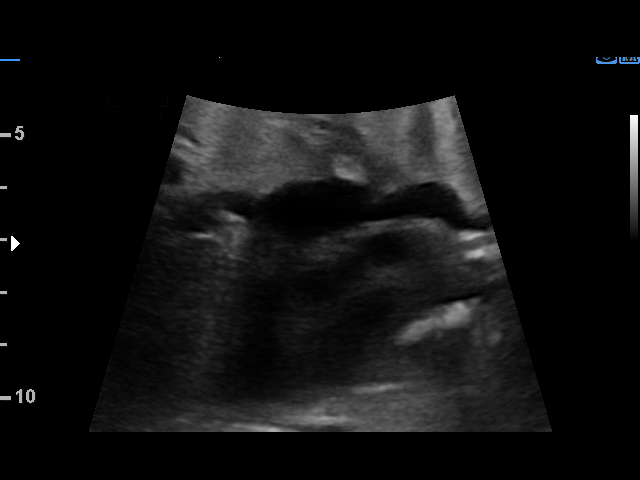
[im 52/59]
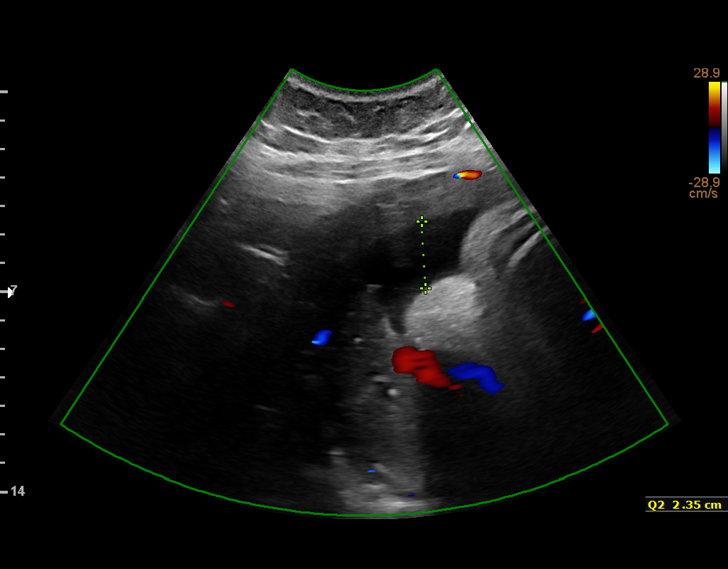
[im 56/59]
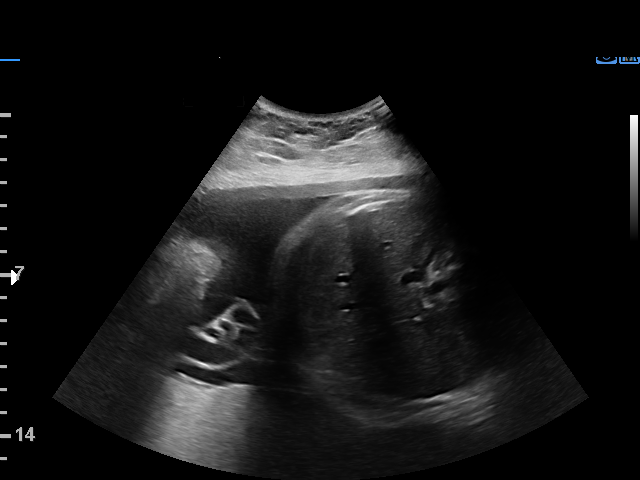

[13 of 28 positions shown; findings below may reference images not displayed]

1  MAXIMA LAINE              [id number]      [id number]     [id number]
2  BOLLENDORFF PACHECO ALCOBIA             [id number]      [id number]     [id number]
Indications

35 weeks gestation of pregnancy
Pre-existing diabetes, type 2, in pregnancy,
third trimester (Humulin N and Humalog)
Encounter for other antenatal screening
follow-up
OB History

Gravidity:    1
Fetal Evaluation

Num Of Fetuses:     1
Fetal Heart         142
Rate(bpm):
Cardiac Activity:   Observed
Presentation:       Cephalic
Placenta:           Posterior, above cervical os
P. Cord Insertion:  Previously Visualized

Amniotic Fluid
AFI FV:      Subjectively within normal limits

AFI Sum(cm)     %Tile       Largest Pocket(cm)
13.81           49

RUQ(cm)       RLQ(cm)       LUQ(cm)        LLQ(cm)
5.37
Biophysical Evaluation

Amniotic F.V:   Within normal limits       F. Tone:        Observed
F. Movement:    Observed                   Score:          [DATE]
F. Breathing:   Observed
Biometry

BPD:      81.9  mm     G. Age:  33w 0d          5  %    CI:        73.55   %    70 - 86
FL/HC:      22.0   %    20.1 -
HC:      303.4  mm     G. Age:  33w 5d        < 3  %    HC/AC:      0.92        0.93 -
AC:      330.3  mm     G. Age:  37w 0d         93  %    FL/BPD:     81.3   %    71 - 87
FL:       66.6  mm     G. Age:  34w 2d         22  %    FL/AC:      20.2   %    20 - 24
HUM:      63.2  mm     G. Age:  36w 5d         94  %

Est. FW:    9993  gm    5 lb 15 oz      69  %
Gestational Age

LMP:           35w 1d        Date:  01/10/17                 EDD:   10/17/17
U/S Today:     34w 4d                                        EDD:   10/21/17
Best:          35w 1d     Det. By:  LMP  (01/10/17)          EDD:   10/17/17
Anatomy

Cranium:               Appears normal         Aortic Arch:            Not well visualized
Cavum:                 Previously seen        Ductal Arch:            Not well visualized
Ventricles:            Appears normal         Diaphragm:              Previously seen
Choroid Plexus:        Previously seen        Stomach:                Appears normal, left
sided
Cerebellum:            Previously seen        Abdomen:                Previously seen
Posterior Fossa:       Previously seen        Abdominal Wall:         Previously seen
Nuchal Fold:           Not applicable (>20    Cord Vessels:           Previously seen
wks GA)
Face:                  Orbits and profile     Kidneys:                Appear normal
previously seen
Lips:                  Previously seen        Bladder:                Appears normal
Thoracic:              Appears normal         Spine:                  Limited views
previously seen
Heart:                 Appears normal         Upper Extremities:      Previously seen
(4CH, axis, and situs
RVOT:                  Not well visualized    Lower Extremities:      Previously seen
LVOT:                  Appears normal

Other:  Technically difficult due to advanced gestational age and fetal
position.
Cervix Uterus Adnexa

Cervix
Not visualized (advanced GA >63wks)
Impression

Single living intrauterine pregnancy at 35w 1d.
Cephalic presentation.
Placenta Posterior, above cervical os.
Normal amniotic fluid volume.
Appropriate interval fetal growth.
Normal interval fetal anatomy.
BPP [DATE]
Recommendations

Continue antenatal testing until delivery at 39 weeks if not
indicated prior.

## 2020-08-22 ENCOUNTER — Telehealth (HOSPITAL_BASED_OUTPATIENT_CLINIC_OR_DEPARTMENT_OTHER): Payer: Self-pay

## 2020-08-22 NOTE — Telephone Encounter (Signed)
Spoke w/ Pt about not accepting Medicaid - will call back after class with updated Ins

## 2020-08-23 ENCOUNTER — Ambulatory Visit (HOSPITAL_BASED_OUTPATIENT_CLINIC_OR_DEPARTMENT_OTHER): Payer: Medicaid Other | Admitting: Nurse Practitioner

## 2020-08-23 ENCOUNTER — Telehealth (HOSPITAL_BASED_OUTPATIENT_CLINIC_OR_DEPARTMENT_OTHER): Payer: Self-pay | Admitting: General Practice

## 2020-08-23 NOTE — Telephone Encounter (Signed)
LVMTCB again regarding the Insurance pt will file. Looks like it is Medicaid that we are no in network with. Appointment is scheduled for today @ 3:30 Please advise.

## 2020-08-23 NOTE — Telephone Encounter (Signed)
LVMTCB again regarding pts insurance. The medicaid that is on file is not in network. Left message stating that for her to call back before her appt today. Stated that she can still be seen in  our office but she will have to pay the full amount in that visit. Please advise.

## 2020-08-29 ENCOUNTER — Encounter (HOSPITAL_BASED_OUTPATIENT_CLINIC_OR_DEPARTMENT_OTHER): Payer: Self-pay | Admitting: Nurse Practitioner

## 2020-09-27 ENCOUNTER — Ambulatory Visit: Payer: Self-pay | Admitting: Nurse Practitioner

## 2020-10-04 ENCOUNTER — Ambulatory Visit: Payer: Medicaid Other | Admitting: Nurse Practitioner

## 2020-10-22 ENCOUNTER — Encounter (HOSPITAL_COMMUNITY): Payer: Self-pay

## 2020-10-22 ENCOUNTER — Emergency Department (HOSPITAL_COMMUNITY)
Admission: EM | Admit: 2020-10-22 | Discharge: 2020-10-22 | Disposition: A | Discharge status: Home / Self Care | Payer: Medicaid Other | Attending: Emergency Medicine | Admitting: Emergency Medicine

## 2020-10-22 ENCOUNTER — Other Ambulatory Visit: Payer: Self-pay

## 2020-10-22 ENCOUNTER — Ambulatory Visit (HOSPITAL_COMMUNITY)
Admission: EM | Admit: 2020-10-22 | Discharge: 2020-10-22 | Disposition: A | Discharge status: Home / Self Care | Payer: No Typology Code available for payment source | Source: Ambulatory Visit | Attending: Emergency Medicine | Admitting: Emergency Medicine

## 2020-10-22 DIAGNOSIS — E119 Type 2 diabetes mellitus without complications: Secondary | ICD-10-CM | POA: Insufficient documentation

## 2020-10-22 DIAGNOSIS — Z794 Long term (current) use of insulin: Secondary | ICD-10-CM | POA: Insufficient documentation

## 2020-10-22 DIAGNOSIS — S40811A Abrasion of right upper arm, initial encounter: Secondary | ICD-10-CM | POA: Insufficient documentation

## 2020-10-22 DIAGNOSIS — X58XXXA Exposure to other specified factors, initial encounter: Secondary | ICD-10-CM | POA: Diagnosis not present

## 2020-10-22 DIAGNOSIS — Z0441 Encounter for examination and observation following alleged adult rape: Secondary | ICD-10-CM | POA: Insufficient documentation

## 2020-10-22 DIAGNOSIS — Z7984 Long term (current) use of oral hypoglycemic drugs: Secondary | ICD-10-CM | POA: Insufficient documentation

## 2020-10-22 DIAGNOSIS — T7421XA Adult sexual abuse, confirmed, initial encounter: Secondary | ICD-10-CM | POA: Diagnosis not present

## 2020-10-22 DIAGNOSIS — S4991XA Unspecified injury of right shoulder and upper arm, initial encounter: Secondary | ICD-10-CM | POA: Diagnosis present
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
## 2020-10-22 NOTE — ED Triage Notes (Signed)
Patient referred by [facility] for SANE exam.
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
## 2020-10-22 NOTE — Discharge Instructions (Signed)

## 2020-10-22 NOTE — ED Notes (Signed)
Care handoff to [place], SANE RN
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
## 2021-03-10 ENCOUNTER — Emergency Department (HOSPITAL_BASED_OUTPATIENT_CLINIC_OR_DEPARTMENT_OTHER)
Admission: EM | Admit: 2021-03-10 | Discharge: 2021-03-10 | Disposition: A | Discharge status: Home / Self Care | Payer: No Typology Code available for payment source | Attending: Emergency Medicine | Admitting: Emergency Medicine

## 2021-03-10 ENCOUNTER — Other Ambulatory Visit: Payer: Self-pay

## 2021-03-10 ENCOUNTER — Encounter: Payer: Self-pay | Admitting: Emergency Medicine

## 2021-03-10 ENCOUNTER — Ambulatory Visit
Admission: EM | Admit: 2021-03-10 | Discharge: 2021-03-10 | Disposition: A | Discharge status: Home / Self Care | Payer: Medicaid Other

## 2021-03-10 ENCOUNTER — Encounter (HOSPITAL_BASED_OUTPATIENT_CLINIC_OR_DEPARTMENT_OTHER): Payer: Self-pay

## 2021-03-10 ENCOUNTER — Emergency Department (HOSPITAL_BASED_OUTPATIENT_CLINIC_OR_DEPARTMENT_OTHER): Payer: No Typology Code available for payment source

## 2021-03-10 DIAGNOSIS — Z794 Long term (current) use of insulin: Secondary | ICD-10-CM | POA: Insufficient documentation

## 2021-03-10 DIAGNOSIS — E119 Type 2 diabetes mellitus without complications: Secondary | ICD-10-CM | POA: Insufficient documentation

## 2021-03-10 DIAGNOSIS — Z7984 Long term (current) use of oral hypoglycemic drugs: Secondary | ICD-10-CM | POA: Insufficient documentation

## 2021-03-10 DIAGNOSIS — Y92410 Unspecified street and highway as the place of occurrence of the external cause: Secondary | ICD-10-CM | POA: Diagnosis not present

## 2021-03-10 DIAGNOSIS — H538 Other visual disturbances: Secondary | ICD-10-CM | POA: Insufficient documentation

## 2021-03-10 DIAGNOSIS — S0990XA Unspecified injury of head, initial encounter: Secondary | ICD-10-CM | POA: Diagnosis present

## 2021-03-10 DIAGNOSIS — S060X0A Concussion without loss of consciousness, initial encounter: Secondary | ICD-10-CM | POA: Insufficient documentation

## 2021-03-10 DIAGNOSIS — M546 Pain in thoracic spine: Secondary | ICD-10-CM | POA: Diagnosis not present

## 2021-03-10 LAB — CBG MONITORING, ED: Glucose-Capillary: 248 mg/dL — ABNORMAL HIGH (ref 70–99)

## 2021-03-10 MED ORDER — METFORMIN HCL 500 MG PO TABS: 500.0000 mg | ORAL_TABLET | Freq: Two times a day (BID) | ORAL | 3 refills | Status: DC
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
## 2021-03-10 NOTE — Discharge Instructions (Signed)
You can take Tylenol or ibuprofen as needed for the headaches.  Start taking the metformin for your blood sugar follow-up with her regular doctor to discuss other diabetic medications.

## 2021-04-08 ENCOUNTER — Ambulatory Visit
Admission: EM | Admit: 2021-04-08 | Discharge: 2021-04-08 | Disposition: A | Discharge status: Home / Self Care | Payer: Medicaid Other | Attending: Physician Assistant | Admitting: Physician Assistant

## 2021-04-08 ENCOUNTER — Other Ambulatory Visit: Payer: Self-pay

## 2021-04-08 DIAGNOSIS — Z113 Encounter for screening for infections with a predominantly sexual mode of transmission: Secondary | ICD-10-CM | POA: Insufficient documentation

## 2021-04-08 NOTE — ED Provider Notes (Signed)
[facility]    CSN: [id number] Arrival date & time: 04/08/21  1023      History   Chief Complaint Chief Complaint  Patient presents with   Vaginal Discharge    HPI Samantha Bowers is a 25 y.o. female.   Patient here today for STD screening.  She reports that she has had some vaginal discharge with a little bit abnormal for her.  She has not had any abdominal pain or back pain.  The history is provided by the patient.  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   Depression 11/05/2017   Mastitis during puerperium 11/05/2017   SVD (spontaneous vaginal delivery) 10/12/2017   Pre-existing type 2 diabetes mellitus in pregnancy 10/10/2017   GBS (group B streptococcus) UTI complicating pregnancy 93/57/0177   Pre-existing type 2 diabetes mellitus with hyperglycemia during pregnancy in third trimester ([place]) 07/12/2017   Chlamydia infection complicating pregnancy 93/90/3009   Supervision of high risk pregnancy, antepartum 07/12/2017   Poor glycemic control 07/12/2017    Past Surgical History:  Procedure Laterality Date   NO PAST SURGERIES      OB History     Gravida  1   Para  1   Term  1   Preterm  0   AB  0   Living  1      SAB  0   IAB  0   Ectopic  0   Multiple  0   Live Births  1            Home Medications    Prior to Admission medications   Medication Sig Start Date End Date Taking? Authorizing Provider  etonogestrel (NEXPLANON) 68 MG IMPL implant 1 each by Subdermal route once.   Yes [provider]  blood glucose  meter kit and supplies KIT Dispense based on patient and insurance preference. Use up to four times daily as directed. (FOR ICD-9 250.00, 250.01). 04/28/18   McNew, Tyson Babinski, MD  FLUoxetine (PROZAC) 20 MG capsule Take 1 capsule (20 mg total) by mouth daily. 04/29/18   McNew, Tyson Babinski, MD  glucose blood test strip Use as instructed 10/01/17   Katheren Shams, DO  hydrOXYzine (ATARAX/VISTARIL) 25 MG tablet Take 1 tablet (25 mg total) by mouth 3 (three) times daily as needed for anxiety. 04/28/18   McNew, Tyson Babinski, MD  insulin NPH-regular Human (NOVOLIN 70/30) (70-30) 100 UNIT/ML injection Inject 10 Units into the skin 2 (two) times daily with a meal. 04/28/18 05/28/18  McNew, Tyson Babinski, MD  Insulin Syringe-Needle U-100 (SAFETY INSULIN SYRINGES) 30G X 5/16" 0.5 ML MISC Use with insulin BID with meals 04/28/18   McNew, Tyson Babinski, MD  metFORMIN (GLUCOPHAGE) 500 MG tablet Take 1 tablet (500 mg total) by mouth 2 (two) times daily with a meal. 03/10/21 07/08/21  Blanchie Dessert, MD    Family History Family History  Problem Relation Age of Onset   Diabetes Mother    Diabetes Father     Social History Social History   Tobacco Use   Smoking status: Never  Smokeless tobacco: Never  Substance Use Topics   Alcohol use: No   Drug use: No     Allergies   Patient has no known allergies.   Review of Systems Review of Systems  Constitutional:  Negative for chills and fever.  Eyes:  Negative for discharge and redness.  Respiratory:  Negative for shortness of breath.   Gastrointestinal:  Negative for abdominal pain, nausea and vomiting.  Genitourinary:  Positive for vaginal bleeding and vaginal discharge. Negative for pelvic pain.    Physical Exam Triage Vital Signs ED Triage Vitals  Enc Vitals Group     BP 04/08/21 1112 125/77     Pulse Rate 04/08/21 1112 70     Resp 04/08/21 1112 18     Temp 04/08/21 1112 97.8 F (36.6 C)     Temp Source 04/08/21 1112 Oral     SpO2 04/08/21 1112 97 %     Weight --       Height --      Head Circumference --      Peak Flow --      Pain Score 04/08/21 1114 0     Pain Loc --      Pain Edu? --      Excl. in [place]? --    No data found.  Updated Vital Signs BP 125/77 (BP Location: Left Arm)   Pulse 70   Temp 97.8 F (36.6 C) (Oral)   Resp 18   SpO2 97%      Physical Exam Vitals and nursing note reviewed.  Constitutional:      General: She is not in acute distress.    Appearance: Normal appearance. She is not ill-appearing.  HENT:     Head: Normocephalic and atraumatic.  Eyes:     Conjunctiva/sclera: Conjunctivae normal.  Cardiovascular:     Rate and Rhythm: Normal rate.  Pulmonary:     Effort: Pulmonary effort is normal.  Neurological:     Mental Status: She is alert.  Psychiatric:        Mood and Affect: Mood normal.        Behavior: Behavior normal.        Thought Content: Thought content normal.     UC Treatments / Results  Labs (all labs ordered are listed, but only abnormal results are displayed) Labs Reviewed  HIV ANTIBODY (ROUTINE TESTING W REFLEX)  HEPATITIS PANEL, ACUTE  RPR  CERVICOVAGINAL ANCILLARY ONLY    EKG   Radiology No results found.  Procedures Procedures (including critical care time)  Medications Ordered in UC Medications - No data to display  Initial Impression / Assessment and Plan / UC Course  I have reviewed the triage vital signs and the nursing notes.  Pertinent labs & imaging results that were available during my care of the patient were reviewed by me and considered in my medical decision making (see chart for details).  STD screening ordered as requested.  Will await results further recommendation.  Encouraged follow-up with any further concerns  Final Clinical Impressions(s) / UC Diagnoses   Final diagnoses:  Screening for STD (sexually transmitted disease)   Discharge Instructions   None    ED Prescriptions   None    PDMP not reviewed this encounter.   Francene Finders,  PA-C 04/08/21 1206

## 2021-04-08 NOTE — ED Triage Notes (Signed)
Pt reports that she recently ended a relationship and would like a routine STD test including blood work. Confirms some odor and vaginal discharge over the last two weeks that presents in a larger amount than normal for her. No vaginal itching or irritation.

## 2021-04-10 ENCOUNTER — Telehealth (HOSPITAL_COMMUNITY): Payer: Self-pay | Admitting: Emergency Medicine

## 2021-04-10 LAB — CERVICOVAGINAL ANCILLARY ONLY
Bacterial Vaginitis (gardnerella): POSITIVE — AB
Candida Glabrata: NEGATIVE
Candida Vaginitis: NEGATIVE
Chlamydia: NEGATIVE
Comment: NEGATIVE
Comment: NEGATIVE
Comment: NEGATIVE
Comment: NEGATIVE
Comment: NEGATIVE
Comment: NORMAL
Neisseria Gonorrhea: NEGATIVE
Trichomonas: NEGATIVE

## 2021-04-10 MED ORDER — METRONIDAZOLE 500 MG PO TABS: 500.0000 mg | ORAL_TABLET | Freq: Two times a day (BID) | ORAL | 0 refills | Status: DC

## 2021-04-11 ENCOUNTER — Other Ambulatory Visit: Payer: Self-pay | Admitting: Family Medicine

## 2021-04-11 ENCOUNTER — Other Ambulatory Visit: Payer: Self-pay

## 2021-04-11 ENCOUNTER — Ambulatory Visit (HOSPITAL_COMMUNITY)
Admission: RE | Admit: 2021-04-11 | Discharge: 2021-04-11 | Disposition: A | Discharge status: Home / Self Care | Payer: Medicaid Other | Source: Ambulatory Visit | Attending: Family Medicine | Admitting: Family Medicine

## 2021-04-11 DIAGNOSIS — Z3049 Encounter for surveillance of other contraceptives: Secondary | ICD-10-CM | POA: Diagnosis present

## 2021-04-11 DIAGNOSIS — M79602 Pain in left arm: Secondary | ICD-10-CM

## 2021-04-12 LAB — HIV ANTIBODY (ROUTINE TESTING W REFLEX): HIV Screen 4th Generation wRfx: NONREACTIVE

## 2021-04-12 LAB — RPR: RPR Ser Ql: NONREACTIVE

## 2021-07-13 ENCOUNTER — Ambulatory Visit
Admission: RE | Admit: 2021-07-13 | Discharge: 2021-07-13 | Disposition: A | Discharge status: Home / Self Care | Payer: Medicaid Other | Source: Ambulatory Visit

## 2021-07-13 ENCOUNTER — Other Ambulatory Visit: Payer: Self-pay

## 2021-07-13 VITALS — BP 110/72 | HR 76 | Temp 99.8°F | Resp 18

## 2021-07-13 DIAGNOSIS — N898 Other specified noninflammatory disorders of vagina: Secondary | ICD-10-CM | POA: Diagnosis not present

## 2021-07-13 LAB — POCT URINALYSIS DIP (MANUAL ENTRY)
Bilirubin, UA: NEGATIVE
Blood, UA: NEGATIVE
Glucose, UA: >=1000 mg/dL — AB
Ketones, POC UA: NEGATIVE mg/dL
Leukocytes, UA: NEGATIVE
Nitrite, UA: NEGATIVE
Protein Ur, POC: NEGATIVE mg/dL
Spec Grav, UA: 1.025 (ref 1.010–1.025)
Urobilinogen, UA: 0.2 E.U./dL
pH, UA: 6.5 (ref 5.0–8.0)

## 2021-07-13 LAB — POCT URINE PREGNANCY: Preg Test, Ur: NEGATIVE

## 2021-07-13 NOTE — ED Triage Notes (Signed)
Pt requesting STD testing, she reports having some vaginal itching.  Started: 1-2 days ago

## 2021-07-13 NOTE — ED Provider Notes (Signed)
[facility]  ____________________________________________  Time seen: Approximately 10:52 AM  I have reviewed the triage vital signs and the nursing notes.   HISTORY  Chief Complaint Vaginal Itching   Historian Patient     HPI Samantha Bowers is a 26 y.o. female with a history of type 2 diabetes, depression and generalized anxiety, presents to the urgent care with vaginal itching.  Patient reports that she has recently had unprotected sex with a new partner and does have concerns for STDs.  Patient states that she has had some increased vaginal discharge.  No deep dyspareunia.  No recent antibiotic usage.  Patient states that she is prone to both BV and yeast vaginitis.   Past Medical History:  Diagnosis Date   Depression    Diabetes mellitus without complication ([place])    gestational     Immunizations up to date:  No.   Past Medical History:  Diagnosis Date   Depression    Diabetes mellitus without complication [facility])    gestational    Patient Active Problem List   Diagnosis Date Noted   GAD (generalized anxiety disorder) 04/28/2018   Type 2 diabetes mellitus ([place]) 04/27/2018   Severe recurrent major depression without psychotic features ([place]) 04/26/2018   Depression 11/05/2017   Mastitis during puerperium 11/05/2017   SVD (spontaneous vaginal delivery) 10/12/2017   Pre-existing type 2 diabetes mellitus in pregnancy 10/10/2017   GBS (group B streptococcus) UTI complicating pregnancy 12/87/8676   Pre-existing type 2 diabetes mellitus with hyperglycemia during pregnancy in third trimester ([place]) 07/12/2017   Chlamydia infection complicating pregnancy 72/01/4708   Supervision of high risk pregnancy, antepartum 07/12/2017   Poor glycemic control 07/12/2017    Past Surgical History:  Procedure Laterality Date   NO PAST SURGERIES      Prior to Admission medications   Medication Sig Start Date End Date Taking? Authorizing Provider  blood  glucose meter kit and supplies KIT Dispense based on patient and insurance preference. Use up to four times daily as directed. (FOR ICD-9 250.00, 250.01). 04/28/18   McNew, Tyson Babinski, MD  etonogestrel (NEXPLANON) 68 MG IMPL implant 1 each by Subdermal route once.    [provider]  FLUoxetine (PROZAC) 20 MG capsule Take 1 capsule (20 mg total) by mouth daily. 04/29/18   McNew, Tyson Babinski, MD  glucose blood test strip Use as instructed 10/01/17   Katheren Shams, DO  hydrOXYzine (ATARAX/VISTARIL) 25 MG tablet Take 1 tablet (25 mg total) by mouth 3 (three) times daily as needed for anxiety. 04/28/18   McNew, Tyson Babinski, MD  insulin NPH-regular Human (NOVOLIN 70/30) (70-30) 100 UNIT/ML injection Inject 10 Units into the skin 2 (two) times daily with a meal. 04/28/18 05/28/18  McNew, Tyson Babinski, MD  Insulin Syringe-Needle U-100 (SAFETY INSULIN SYRINGES) 30G X 5/16" 0.5 ML MISC Use with insulin BID with meals 04/28/18   McNew, Tyson Babinski, MD  metFORMIN (GLUCOPHAGE) 500 MG tablet Take 1 tablet (500 mg total) by mouth 2 (two) times daily with a meal. 03/10/21 07/08/21  Blanchie Dessert, MD  metroNIDAZOLE (FLAGYL) 500 MG tablet Take 1 tablet (500 mg total) by mouth 2 (two) times daily. 04/10/21   Lamptey, Myrene Galas, MD  OZEMPIC, 0.25 OR 0.5 MG/DOSE, 2 MG/1.5ML SOPN SMARTSIG:0.25 Milligram(s) SUB-Q Once a Week 06/13/21   [provider]    Allergies Patient has no known allergies.  Family History  Problem Relation Age of Onset   Diabetes Mother    Diabetes Father  Social History Social History   Tobacco Use   Smoking status: Never   Smokeless tobacco: Never  Substance Use Topics   Alcohol use: Yes   Drug use: No     Review of Systems  Constitutional: No fever/chills Eyes:  No discharge ENT: No upper respiratory complaints. Respiratory: no cough. No SOB/ use of accessory muscles to breath Gastrointestinal:   No nausea, no vomiting.  No diarrhea.  No constipation. Genitourinary: Patient has  vaginal itching.  Musculoskeletal: Negative for musculoskeletal pain. Skin: Negative for rash, abrasions, lacerations, ecchymosis.  ____________________________________________   PHYSICAL EXAM:  VITAL SIGNS: ED Triage Vitals  Enc Vitals Group     BP 07/13/21 1004 110/72     Pulse Rate 07/13/21 1004 76     Resp 07/13/21 1004 18     Temp 07/13/21 1004 99.8 F (37.7 C)     Temp Source 07/13/21 1004 Oral     SpO2 07/13/21 1004 97 %     Weight --      Height --      Head Circumference --      Peak Flow --      Pain Score 07/13/21 1001 0     Pain Loc --      Pain Edu? --      Excl. in [place]? --      Constitutional: Alert and oriented. Well appearing and in no acute distress. Eyes: Conjunctivae are normal. PERRL. EOMI. Head: Atraumatic. ENT:      Nose: No congestion/rhinnorhea.      Mouth/Throat: Mucous membranes are moist.  Neck: No stridor.  No cervical spine tenderness to palpation. Cardiovascular: Normal rate, regular rhythm. Normal S1 and S2.  Good peripheral circulation. Respiratory: Normal respiratory effort without tachypnea or retractions. Lungs CTAB. Good air entry to the bases with no decreased or absent breath sounds Gastrointestinal: Bowel sounds x 4 quadrants. Soft and nontender to palpation. No guarding or rigidity. No distention. Musculoskeletal: Full range of motion to all extremities. No obvious deformities noted Neurologic:  Normal for age. No gross focal neurologic deficits are appreciated.  Skin:  Skin is warm, dry and intact. No rash noted. Psychiatric: Mood and affect are normal for age. Speech and behavior are normal.   ____________________________________________   LABS (all labs ordered are listed, but only abnormal results are displayed)  Labs Reviewed  POCT URINALYSIS DIP (MANUAL ENTRY) - Abnormal; Notable for the following components:      Result Value   Glucose, UA >=1,000 (*)    All other components within normal limits  HIV ANTIBODY  (ROUTINE TESTING W REFLEX)  RPR  POCT URINE PREGNANCY  CERVICOVAGINAL ANCILLARY ONLY   ____________________________________________  EKG   ____________________________________________  RADIOLOGY   No results found.  ____________________________________________    PROCEDURES  Procedure(s) performed:     Procedures     Medications - No data to display   ____________________________________________   INITIAL IMPRESSION / ASSESSMENT AND PLAN / ED COURSE  Pertinent labs & imaging results that were available during my care of the patient were reviewed by me and considered in my medical decision making (see chart for details).       Assessment and plan Vaginal itching 26 year old female presents to the urgent care with vaginal itching for the past 2 days and recent unprotected sex.  Vital signs are reassuring at triage.  On physical exam, patient was alert, active and nontoxic-appearing.  Cervical vaginal swab for gonorrhea, chlamydia, yeast and BV in process at  this time.  Patient also requested testing for HIV and syphilis.  We will hold off on treatment until testing returns.  Patient feels comfortable with this plan.  Urine pregnancy test was negative.    ____________________________________________  FINAL CLINICAL IMPRESSION(S) / ED DIAGNOSES  Final diagnoses:  Vaginal itching      NEW MEDICATIONS STARTED DURING THIS VISIT:  ED Discharge Orders     None           This chart was dictated using voice recognition software/Dragon. Despite best efforts to proofread, errors can occur which can change the meaning. Any change was purely unintentional.     Lannie Fields, PA-C 07/13/21 1054

## 2021-07-14 LAB — CERVICOVAGINAL ANCILLARY ONLY

## 2021-07-14 LAB — HIV ANTIBODY (ROUTINE TESTING W REFLEX): HIV Screen 4th Generation wRfx: NONREACTIVE

## 2021-07-14 LAB — RPR: RPR Ser Ql: NONREACTIVE

## 2021-07-16 ENCOUNTER — Ambulatory Visit (HOSPITAL_COMMUNITY)
Admission: EM | Admit: 2021-07-16 | Discharge: 2021-07-16 | Disposition: A | Discharge status: Home / Self Care | Payer: Medicaid Other | Attending: Internal Medicine | Admitting: Internal Medicine

## 2021-07-16 ENCOUNTER — Other Ambulatory Visit: Payer: Self-pay

## 2021-07-16 DIAGNOSIS — N760 Acute vaginitis: Secondary | ICD-10-CM | POA: Insufficient documentation

## 2021-07-16 NOTE — ED Triage Notes (Signed)
Pt presents for vaginal reswab.

## 2021-07-17 LAB — CERVICOVAGINAL ANCILLARY ONLY
Bacterial Vaginitis (gardnerella): POSITIVE — AB
Candida Glabrata: NEGATIVE
Candida Vaginitis: NEGATIVE
Chlamydia: NEGATIVE
Comment: NEGATIVE
Comment: NEGATIVE
Comment: NEGATIVE
Comment: NEGATIVE
Comment: NEGATIVE
Comment: NORMAL
Neisseria Gonorrhea: NEGATIVE
Trichomonas: NEGATIVE

## 2021-07-18 ENCOUNTER — Telehealth (HOSPITAL_COMMUNITY): Payer: Self-pay | Admitting: Emergency Medicine

## 2021-07-18 MED ORDER — METRONIDAZOLE 500 MG PO TABS: 500.0000 mg | ORAL_TABLET | Freq: Two times a day (BID) | ORAL | 0 refills | Status: DC

## 2021-08-17 ENCOUNTER — Telehealth: Payer: Self-pay | Admitting: Emergency Medicine

## 2021-08-17 DIAGNOSIS — B001 Herpesviral vesicular dermatitis: Secondary | ICD-10-CM

## 2021-08-17 MED ORDER — VALACYCLOVIR HCL 500 MG PO TABS: 500.0000 mg | ORAL_TABLET | Freq: Two times a day (BID) | ORAL | 3 refills | Status: AC

## 2021-08-17 NOTE — Telephone Encounter (Signed)
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  Discussed suppressive therapy, patient provided with a prescription of oral Valtrex twice daily, have advised her to take once daily and increase to twice daily as needed for outbreaks. ?

## 2021-09-18 ENCOUNTER — Encounter (HOSPITAL_COMMUNITY): Payer: Self-pay

## 2021-09-18 ENCOUNTER — Ambulatory Visit (HOSPITAL_COMMUNITY)
Admission: RE | Admit: 2021-09-18 | Discharge: 2021-09-18 | Disposition: A | Discharge status: Home / Self Care | Payer: Medicaid Other | Source: Ambulatory Visit | Attending: Emergency Medicine | Admitting: Emergency Medicine

## 2021-09-18 VITALS — BP 109/78 | HR 83 | Temp 98.3°F | Resp 17

## 2021-09-18 DIAGNOSIS — Z113 Encounter for screening for infections with a predominantly sexual mode of transmission: Secondary | ICD-10-CM | POA: Diagnosis present

## 2021-09-18 DIAGNOSIS — Z202 Contact with and (suspected) exposure to infections with a predominantly sexual mode of transmission: Secondary | ICD-10-CM | POA: Diagnosis not present

## 2021-09-18 LAB — POC URINE PREG, ED: Preg Test, Ur: NEGATIVE

## 2021-09-18 NOTE — Discharge Instructions (Addendum)
Pregnancy test negative ? ?Labs pending 2-3 days, you will be contacted if positive for any sti and treatment will be sent to the pharmacy, you will have to return to the clinic if positive for gonorrhea to receive treatment  ? ?Please refrain from having sex until labs results, if positive please refrain from having sex until treatment complete and symptoms resolve  ? ?If positive for HIV, Syphilis, Chlamydia  gonorrhea or trichomoniasis please notify partner or partners so they may tested as well ? ?Moving forward, it is recommended you use some form of protection against the transmission of sti infections  such as condoms or dental dams with each sexual encounter  ? ?

## 2021-09-18 NOTE — ED Provider Notes (Signed)
?[facility] ? ? ? ?CSN: [id number] ?Arrival date & time: 09/18/21  1006 ? ? ?  ? ?History   ?Chief Complaint ?Chief Complaint  ?Patient presents with  ? Possible Pregnancy  ?  Recently had unprotected sex and wanted to get std test and have a test done. - Entered by patient  ? SEXUALLY TRANSMITTED DISEASE  ? ? ?HPI ?Samantha Bowers is a 26 y.o. female.  ? ?Patient presents requesting STI testing and pregnancy test after unprotected sexual encounter 1 week ago.  Sexually active, 2 partners in the last 3 months, sometimes condom use.  Denies all symptoms.  No known exposure.  Last menstrual period 09/05/2021.  Not currently on birth control. ? ?Past Medical History:  ?Diagnosis Date  ? Depression   ? Diabetes mellitus without complication ([place])   ? gestational  ? ? ?Patient Active Problem List  ? Diagnosis Date Noted  ? GAD (generalized anxiety disorder) 04/28/2018  ? Type 2 diabetes mellitus ([place]) 04/27/2018  ? Severe recurrent major depression without psychotic features ([place]) 04/26/2018  ? Depression 11/05/2017  ? Mastitis during puerperium 11/05/2017  ? SVD (spontaneous vaginal delivery) 10/12/2017  ? Pre-existing type 2 diabetes mellitus in pregnancy 10/10/2017  ? GBS (group B streptococcus) UTI complicating pregnancy 11/91/4782  ? Pre-existing type 2 diabetes mellitus with hyperglycemia during pregnancy in third trimester ([place]) 07/12/2017  ? Chlamydia infection complicating pregnancy 95/62/1308  ? Supervision of high risk pregnancy, antepartum 07/12/2017  ? Poor glycemic control 07/12/2017  ? ? ?Past Surgical History:  ?Procedure Laterality Date  ? NO PAST SURGERIES    ? ? ?OB History   ? ? Gravida  ?1  ? Para  ?1  ? Term  ?1  ? Preterm  ?0  ? AB  ?0  ? Living  ?1  ?  ? ? SAB  ?0  ? IAB  ?0  ? Ectopic  ?0  ? Multiple  ?0  ? Live Births  ?1  ?   ?  ?  ? ? ? ?Home Medications   ? ?Prior to Admission medications   ?Medication Sig Start Date End Date Taking? Authorizing Provider  ?blood glucose meter  kit and supplies KIT Dispense based on patient and insurance preference. Use up to four times daily as directed. (FOR ICD-9 250.00, 250.01). 04/28/18   McNew, Tyson Babinski, MD  ?etonogestrel (NEXPLANON) 68 MG IMPL implant 1 each by Subdermal route once.    [provider]  ?FLUoxetine (PROZAC) 20 MG capsule Take 1 capsule (20 mg total) by mouth daily. 04/29/18   McNew, Tyson Babinski, MD  ?glucose blood test strip Use as instructed 10/01/17   Katheren Shams, DO  ?hydrOXYzine (ATARAX/VISTARIL) 25 MG tablet Take 1 tablet (25 mg total) by mouth 3 (three) times daily as needed for anxiety. 04/28/18   McNew, Tyson Babinski, MD  ?insulin NPH-regular Human (NOVOLIN 70/30) (70-30) 100 UNIT/ML injection Inject 10 Units into the skin 2 (two) times daily with a meal. 04/28/18 05/28/18  McNew, Tyson Babinski, MD  ?Insulin Syringe-Needle U-100 (SAFETY INSULIN SYRINGES) 30G X 5/16" 0.5 ML MISC Use with insulin BID with meals 04/28/18   McNew, Tyson Babinski, MD  ?metFORMIN (GLUCOPHAGE) 500 MG tablet Take 1 tablet (500 mg total) by mouth 2 (two) times daily with a meal. 03/10/21 07/08/21  Blanchie Dessert, MD  ?metroNIDAZOLE (FLAGYL) 500 MG tablet Take 1 tablet (500 mg total) by mouth 2 (two) times daily. 07/18/21   Lamptey, Myrene Galas, MD  ?  OZEMPIC, 0.25 OR 0.5 MG/DOSE, 2 MG/1.5ML SOPN SMARTSIG:0.25 Milligram(s) SUB-Q Once a Week 06/13/21   [provider]  ?valACYclovir (VALTREX) 500 MG tablet Take 1 tablet (500 mg total) by mouth 2 (two) times daily. 08/17/21 08/12/22  Lynden Oxford Scales, PA-C  ? ? ?Family History ?Family History  ?Problem Relation Age of Onset  ? Diabetes Mother   ? Diabetes Father   ? ? ?Social History ?Social History  ? ?Tobacco Use  ? Smoking status: Never  ? Smokeless tobacco: Never  ?Substance Use Topics  ? Alcohol use: Yes  ? Drug use: No  ? ? ? ?Allergies   ?Patient has no known allergies. ? ? ?Review of Systems ?Review of Systems  ?Constitutional: Negative.   ?Respiratory: Negative.    ?Genitourinary: Negative.    ?Musculoskeletal: Negative.   ?Neurological: Negative.   ? ? ?Physical Exam ?Triage Vital Signs ?ED Triage Vitals  ?Enc Vitals Group  ?   BP 09/18/21 1031 109/78  ?   Pulse Rate 09/18/21 1031 83  ?   Resp 09/18/21 1031 17  ?   Temp 09/18/21 1031 98.3 ?F (36.8 ?C)  ?   Temp Source 09/18/21 1031 Oral  ?   SpO2 09/18/21 1031 98 %  ?   Weight --   ?   Height --   ?   Head Circumference --   ?   Peak Flow --   ?   Pain Score 09/18/21 1030 0  ?   Pain Loc --   ?   Pain Edu? --   ?   Excl. in [place]? --   ? ?No data found. ? ?Updated Vital Signs ?BP 109/78   Pulse 83   Temp 98.3 ?F (36.8 ?C) (Oral)   Resp 17   LMP 09/05/2021   SpO2 98%  ? ?Visual Acuity ?Right Eye Distance:   ?Left Eye Distance:   ?Bilateral Distance:   ? ?Right Eye Near:   ?Left Eye Near:    ?Bilateral Near:    ? ?Physical Exam ?Constitutional:   ?   Appearance: Normal appearance.  ?Eyes:  ?   Extraocular Movements: Extraocular movements intact.  ?Pulmonary:  ?   Effort: Pulmonary effort is normal.  ?Genitourinary: ?   Comments: Deferred, self collect vaginal swab ?Neurological:  ?   Mental Status: She is alert and oriented to person, place, and time. Mental status is at baseline.  ?Psychiatric:     ?   Mood and Affect: Mood normal.     ?   Behavior: Behavior normal.  ? ? ? ?UC Treatments / Results  ?Labs ?(all labs ordered are listed, but only abnormal results are displayed) ?Labs Reviewed  ?HIV ANTIBODY (ROUTINE TESTING W REFLEX)  ?RPR  ?POC URINE PREG, ED  ?CERVICOVAGINAL ANCILLARY ONLY  ? ? ?EKG ? ? ?Radiology ?No results found. ? ?Procedures ?Procedures (including critical care time) ? ?Medications Ordered in UC ?Medications - No data to display ? ?Initial Impression / Assessment and Plan / UC Course  ?I have reviewed the triage vital signs and the nursing notes. ? ?Pertinent labs & imaging results that were available during my care of the patient were reviewed by me and considered in my medical decision making (see chart for details). ? ?Routine  screening for STI ? ?Pregnancy test negative, STI labs pending, will treat per protocol, advised abstinence until all labs results and/or treatment is complete, advised condom use during all sexual encounters moving forward, may follow-up with urgent  care as needed ?Final Clinical Impressions(s) / UC Diagnoses  ? ?Final diagnoses:  ?Routine screening for STI (sexually transmitted infection)  ? ? ? ?Discharge Instructions   ? ?  ?Pregnancy test negative ? ?Labs pending 2-3 days, you will be contacted if positive for any sti and treatment will be sent to the pharmacy, you will have to return to the clinic if positive for gonorrhea to receive treatment  ? ?Please refrain from having sex until labs results, if positive please refrain from having sex until treatment complete and symptoms resolve  ? ?If positive for HIV, Syphilis, Chlamydia  gonorrhea or trichomoniasis please notify partner or partners so they may tested as well ? ?Moving forward, it is recommended you use some form of protection against the transmission of sti infections  such as condoms or dental dams with each sexual encounter  ? ? ? ?ED Prescriptions   ?None ?  ? ?PDMP not reviewed this encounter. ?  ?Hans Eden, NP ?09/18/21 1049 ? ?

## 2021-09-18 NOTE — ED Triage Notes (Addendum)
Pt is present today for STD testing and pregnancy test. Pt states that she recently had unprotected one week ago  ?

## 2021-09-19 LAB — CERVICOVAGINAL ANCILLARY ONLY
Bacterial Vaginitis (gardnerella): NEGATIVE
Candida Glabrata: NEGATIVE
Candida Vaginitis: NEGATIVE
Chlamydia: NEGATIVE
Comment: NEGATIVE
Comment: NEGATIVE
Comment: NEGATIVE
Comment: NEGATIVE
Comment: NEGATIVE
Comment: NORMAL
Neisseria Gonorrhea: NEGATIVE
Trichomonas: NEGATIVE

## 2021-09-19 LAB — HIV ANTIBODY (ROUTINE TESTING W REFLEX): HIV Screen 4th Generation wRfx: NONREACTIVE

## 2021-09-19 LAB — RPR: RPR Ser Ql: NONREACTIVE

## 2021-11-10 ENCOUNTER — Encounter (HOSPITAL_COMMUNITY): Payer: Self-pay

## 2021-11-10 ENCOUNTER — Ambulatory Visit: Payer: Medicaid Other

## 2021-11-10 ENCOUNTER — Ambulatory Visit (HOSPITAL_COMMUNITY)
Admission: EM | Admit: 2021-11-10 | Discharge: 2021-11-10 | Disposition: A | Discharge status: Home / Self Care | Payer: Medicaid Other | Attending: Student | Admitting: Student

## 2021-11-10 DIAGNOSIS — Z3202 Encounter for pregnancy test, result negative: Secondary | ICD-10-CM | POA: Diagnosis not present

## 2021-11-10 DIAGNOSIS — Z113 Encounter for screening for infections with a predominantly sexual mode of transmission: Secondary | ICD-10-CM | POA: Insufficient documentation

## 2021-11-10 DIAGNOSIS — E1165 Type 2 diabetes mellitus with hyperglycemia: Secondary | ICD-10-CM | POA: Diagnosis not present

## 2021-11-10 DIAGNOSIS — N760 Acute vaginitis: Secondary | ICD-10-CM | POA: Diagnosis present

## 2021-11-10 LAB — POCT URINALYSIS DIPSTICK, ED / UC
Bilirubin Urine: NEGATIVE
Glucose, UA: >=1000 mg/dL — AB
Hgb urine dipstick: NEGATIVE
Ketones, ur: NEGATIVE mg/dL
Leukocytes,Ua: NEGATIVE
Nitrite: NEGATIVE
Protein, ur: NEGATIVE mg/dL
Specific Gravity, Urine: 1.02 (ref 1.005–1.030)
Urobilinogen, UA: 1 mg/dL (ref 0.0–1.0)
pH: 6.5 (ref 5.0–8.0)

## 2021-11-10 LAB — POC URINE PREG, ED: Preg Test, Ur: NEGATIVE

## 2021-11-10 LAB — CBG MONITORING, ED: Glucose-Capillary: 270 mg/dL — ABNORMAL HIGH (ref 70–99)

## 2021-11-10 NOTE — Discharge Instructions (Addendum)
-  We have sent testing for sexually transmitted infections. We will notify you of any positive results once they are received. If required, we will prescribe any medications you might need. Please refrain from all sexual activity until treatment is complete.  -Seek additional medical attention if you develop fevers/chills, new/worsening abdominal pain, new/worsening vaginal discomfort/discharge, etc.  -Your blood sugars are elevated today. Make sure to continue your current regimen and follow-up with PCP for management if sugars continue to be >200 at home

## 2021-11-10 NOTE — ED Triage Notes (Signed)
Pt requesting routine STD testing and pregnancy test. States had unprotected intercourse last week. Denies any sx's.

## 2021-11-10 NOTE — ED Provider Notes (Signed)
[facility]    CSN: [id number] Arrival date & time: 11/10/21  1213      History   Chief Complaint Chief Complaint  Patient presents with   SEXUALLY TRANSMITTED DISEASE    Entered by patient    HPI Laquinta Hazell is a 26 y.o. female presenting with vaginal discharge following unprotected intercourse 1 week ago.  History diabetes, chlamydia.  She describes unprotected intercourse with female partner.  Increase in discharge, faint smell.  Denies abdominal pain, flank pain, fever/chills, dysuria, gross hematuria, urgency, vaginal rash or lesion.  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/26/2018   Depression 11/05/2017   Mastitis during puerperium 11/05/2017   SVD (spontaneous vaginal delivery) 10/12/2017   Pre-existing type 2 diabetes mellitus in pregnancy 10/10/2017   GBS (group B streptococcus) UTI complicating pregnancy 21/30/8657   Pre-existing type 2 diabetes mellitus with hyperglycemia during pregnancy in third trimester ([place]) 07/12/2017   Chlamydia infection complicating pregnancy 84/69/6295   Supervision of high risk pregnancy, antepartum 07/12/2017   Poor glycemic control 07/12/2017    Past Surgical History:  Procedure Laterality Date   NO PAST SURGERIES      OB History     Gravida  1   Para  1   Term  1   Preterm  0   AB  0   Living  1      SAB  0   IAB  0   Ectopic  0   Multiple  0   Live Births  1            Home Medications    Prior to Admission medications   Medication Sig Start Date End Date Taking? Authorizing Provider  blood glucose meter kit and supplies KIT Dispense based on patient and insurance preference.  Use up to four times daily as directed. (FOR ICD-9 250.00, 250.01). 04/28/18   McNew, Tyson Babinski, MD  FLUoxetine (PROZAC) 20 MG capsule Take 1 capsule (20 mg total) by mouth daily. 04/29/18   McNew, Tyson Babinski, MD  glucose blood test strip Use as instructed 10/01/17   Katheren Shams, DO  hydrOXYzine (ATARAX/VISTARIL) 25 MG tablet Take 1 tablet (25 mg total) by mouth 3 (three) times daily as needed for anxiety. 04/28/18   McNew, Tyson Babinski, MD  Insulin Syringe-Needle U-100 (SAFETY INSULIN SYRINGES) 30G X 5/16" 0.5 ML MISC Use with insulin BID with meals 04/28/18   McNew, Tyson Babinski, MD  TRULICITY 1.5 MW/4.1LK SOPN Inject into the skin. 10/02/21   [provider]  valACYclovir (VALTREX) 500 MG tablet Take 1 tablet (500 mg total) by mouth 2 (two) times daily. 08/17/21 08/12/22  Lynden Oxford Scales, PA-C    Family History Family History  Problem Relation Age of Onset   Diabetes Mother    Diabetes Father     Social History Social History   Tobacco Use   Smoking status: Never   Smokeless tobacco: Never  Substance Use Topics   Alcohol use: Yes   Drug use: No     Allergies   Patient has no known allergies.   Review of Systems Review of Systems  Constitutional:  Negative for chills and  fever.  HENT:  Negative for sore throat.   Eyes:  Negative for pain and redness.  Respiratory:  Negative for shortness of breath.   Cardiovascular:  Negative for chest pain.  Gastrointestinal:  Negative for abdominal pain, diarrhea, nausea and vomiting.  Genitourinary:  Positive for vaginal discharge. Negative for decreased urine volume, difficulty urinating, dysuria, flank pain, frequency, genital sores, hematuria and urgency.  Musculoskeletal:  Negative for back pain.  Skin:  Negative for rash.     Physical Exam Triage Vital Signs ED Triage Vitals  Enc Vitals Group     BP 11/10/21 1244 120/69     Pulse Rate 11/10/21 1244 80     Resp 11/10/21 1244 16     Temp 11/10/21 1244 99 F (37.2 C)     Temp  Source 11/10/21 1244 Oral     SpO2 11/10/21 1244 99 %     Weight --      Height --      Head Circumference --      Peak Flow --      Pain Score 11/10/21 1245 0     Pain Loc --      Pain Edu? --      Excl. in [place]? --    No data found.  Updated Vital Signs BP 120/69 (BP Location: Left Arm)   Pulse 80   Temp 99 F (37.2 C) (Oral)   Resp 16   LMP 10/31/2021   SpO2 99%   Visual Acuity Right Eye Distance:   Left Eye Distance:   Bilateral Distance:    Right Eye Near:   Left Eye Near:    Bilateral Near:     Physical Exam Vitals reviewed.  Constitutional:      General: She is not in acute distress.    Appearance: Normal appearance. She is not ill-appearing.  HENT:     Head: Normocephalic and atraumatic.     Mouth/Throat:     Mouth: Mucous membranes are moist.     Comments: Moist mucous membranes Eyes:     Extraocular Movements: Extraocular movements intact.     Pupils: Pupils are equal, round, and reactive to light.  Cardiovascular:     Rate and Rhythm: Normal rate and regular rhythm.     Heart sounds: Normal heart sounds.  Pulmonary:     Effort: Pulmonary effort is normal.     Breath sounds: Normal breath sounds. No wheezing, rhonchi or rales.  Abdominal:     General: Bowel sounds are normal. There is no distension.     Palpations: Abdomen is soft. There is no mass.     Tenderness: There is no abdominal tenderness. There is no right CVA tenderness, left CVA tenderness, guarding or rebound.  Genitourinary:    Comments: deferred Skin:    General: Skin is warm.     Capillary Refill: Capillary refill takes less than 2 seconds.     Comments: Good skin turgor  Neurological:     General: No focal deficit present.     Mental Status: She is alert and oriented to person, place, and time.  Psychiatric:        Mood and Affect: Mood normal.        Behavior: Behavior normal.      UC Treatments / Results  Labs (all labs ordered are listed, but only abnormal results are  displayed) Labs Reviewed  POCT URINALYSIS DIPSTICK, ED / UC - Abnormal; Notable for the following components:  Result Value   Glucose, UA >=1000 (*)    All other components within normal limits  CBG MONITORING, ED - Abnormal; Notable for the following components:   Glucose-Capillary 270 (*)    All other components within normal limits  POC URINE PREG, ED  CERVICOVAGINAL ANCILLARY ONLY    EKG   Radiology No results found.  Procedures Procedures (including critical care time)  Medications Ordered in UC Medications - No data to display  Initial Impression / Assessment and Plan / UC Course  I have reviewed the triage vital signs and the nursing notes.  Pertinent labs & imaging results that were available during my care of the patient were reviewed by me and considered in my medical decision making (see chart for details).     This patient is a very pleasant 26 y.o. year old female presenting with vaginitis following unprotected intercourse with new female partner. Afebrile, nontachycardic, no reproducible abd pain or CVAT.  UA with >1000 glucose, pt is a diabetic. Nonfasting CBG 270.  U-preg negative.   F/u with PCP for management of diabetes.  Will wait to treat.   ED return precautions discussed. Patient verbalizes understanding and agreement.    Final Clinical Impressions(s) / UC Diagnoses   Final diagnoses:  None   Discharge Instructions   None    ED Prescriptions   None    PDMP not reviewed this encounter.   Hazel Sams, PA-C 11/10/21 1333

## 2021-11-12 LAB — CERVICOVAGINAL ANCILLARY ONLY
Bacterial Vaginitis (gardnerella): NEGATIVE
Candida Glabrata: NEGATIVE
Candida Vaginitis: POSITIVE — AB
Chlamydia: NEGATIVE
Comment: NEGATIVE
Comment: NEGATIVE
Comment: NEGATIVE
Comment: NEGATIVE
Comment: NEGATIVE
Comment: NORMAL
Neisseria Gonorrhea: NEGATIVE
Trichomonas: NEGATIVE

## 2021-11-13 ENCOUNTER — Telehealth (HOSPITAL_COMMUNITY): Payer: Self-pay | Admitting: Emergency Medicine

## 2021-11-13 MED ORDER — FLUCONAZOLE 150 MG PO TABS: 150.0000 mg | ORAL_TABLET | Freq: Once | ORAL | 0 refills | Status: AC

## 2022-03-26 ENCOUNTER — Ambulatory Visit (HOSPITAL_COMMUNITY): Payer: Self-pay

## 2022-03-26 ENCOUNTER — Ambulatory Visit
Admission: EM | Admit: 2022-03-26 | Discharge: 2022-03-26 | Disposition: A | Discharge status: Home / Self Care | Payer: Medicaid Other | Attending: Emergency Medicine | Admitting: Emergency Medicine

## 2022-03-26 DIAGNOSIS — H66001 Acute suppurative otitis media without spontaneous rupture of ear drum, right ear: Secondary | ICD-10-CM | POA: Diagnosis not present

## 2022-03-26 DIAGNOSIS — J0141 Acute recurrent pansinusitis: Secondary | ICD-10-CM | POA: Diagnosis not present

## 2022-03-26 DIAGNOSIS — J302 Other seasonal allergic rhinitis: Secondary | ICD-10-CM

## 2022-03-26 MED ORDER — CEFDINIR 300 MG PO CAPS: 300.0000 mg | ORAL_CAPSULE | Freq: Two times a day (BID) | ORAL | 0 refills | Status: AC

## 2022-03-26 MED ORDER — FLUTICASONE PROPIONATE 50 MCG/ACT NA SUSP: 1.0000 | Freq: Every day | NASAL | 2 refills | Status: DC

## 2022-03-26 MED ORDER — CETIRIZINE HCL 10 MG PO TABS: 10.0000 mg | ORAL_TABLET | Freq: Every day | ORAL | 1 refills | Status: DC

## 2022-03-26 NOTE — ED Triage Notes (Addendum)
The patient c/o cough, and an aching throat that began Friday- Saturday. The patient states she did have a low grade fever this weekend also.   Home interventions: Nyquil

## 2022-03-26 NOTE — Discharge Instructions (Signed)
Your symptoms and my physical exam findings are concerning for exacerbation of your underlying allergies increased your risk of acquiring a sinus infection and now a bacterial infection in your right inner ear.    Please read below to learn more about the medications, dosages and frequencies that I recommend to help alleviate your symptoms and to get you feeling better soon:   Omnicef (cefdinir): To treat the bacterial infection in your right inner ear, please take 1 capsule twice daily for 10 days, you can take it with or without food.  This antibiotic can cause upset stomach, this will resolve once antibiotics are complete.  You are welcome to use a probiotic, eat yogurt, take Imodium while taking this medication.  Please avoid other systemic medications such as Maalox, Pepto-Bismol or milk of magnesia as they can interfere with your body's ability to absorb the antibiotics.      Zyrtec (cetirizine): This is an excellent second-generation antihistamine that helps to reduce respiratory inflammatory response to environmental allergens.  In some patients, this medication can cause daytime sleepiness so I recommend that you take 1 tablet daily at bedtime.  I recommend that you stay on this medication for the next 30 days   Flonase (fluticasone): This is a steroid nasal spray that you use once daily, 1 spray in each nare.  This medication does not work well if you decide to use it only used as you feel you need to, it works best used on a daily basis.  After 3 to 5 days of use, you will notice significant reduction of the inflammation and mucus production that is currently being caused by exposure to allergens, whether seasonal or environmental.  The most common side effect of this medication is nosebleeds.  If you experience a nosebleed, please discontinue use for 1 week, then feel free to resume.  I have provided you with a prescription.  I recommend that you stay on this medication for the next 30 days.    If you find that you have not had significant relief of your symptoms in the next 7 to 10 days, please follow-up with your primary care provider or return here to urgent care for repeat evaluation and further recommendations.   Thank you for visiting urgent care today.  We appreciate the opportunity to participate in your care.

## 2022-03-26 NOTE — ED Provider Notes (Signed)
[facility]    CSN: [id number] Arrival date & time: 03/26/22  1350    HISTORY   Chief Complaint  Patient presents with   Cough   HPI Samantha Bowers is a pleasant, 26 y.o. female who presents to urgent care today. The patient c/o cough, and throat discomfort that began Friday- Saturday. The patient states she did have a low grade fever this weekend also.  Patient states she is been taking NyQuil without meaningful relief of her symptoms.  Patient reports a history of frequent sinus infections, last episode was last week, states she did not seek medical attention because they usually resolve on their own without intervention.  Patient states she also notes that she has a history of allergies but does not like to take allergy medication every day.  The history is provided by the patient.   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   Depression 11/05/2017   Mastitis during puerperium 11/05/2017   SVD (spontaneous vaginal delivery) 10/12/2017   Pre-existing type 2 diabetes mellitus in pregnancy 10/10/2017   GBS (group B streptococcus) UTI complicating pregnancy 95/01/3266   Pre-existing type 2 diabetes mellitus with hyperglycemia during pregnancy in third trimester ([place]) 07/12/2017   Chlamydia infection complicating pregnancy 12/45/8099   Supervision of high risk pregnancy, antepartum 07/12/2017   Poor glycemic control 07/12/2017   Past Surgical History:  Procedure Laterality Date   NO PAST SURGERIES     OB History     Gravida  1   Para  1   Term  1   Preterm  0   AB  0   Living  1      SAB  0   IAB  0   Ectopic  0   Multiple  0   Live Births   1          Home Medications    Prior to Admission medications   Medication Sig Start Date End Date Taking? Authorizing Provider  blood glucose meter kit and supplies KIT Dispense based on patient and insurance preference. Use up to four times daily as directed. (FOR ICD-9 250.00, 250.01). 04/28/18   McNew, Tyson Babinski, MD  glucose blood test strip Use as instructed 10/01/17   Katheren Shams, DO  hydrOXYzine (ATARAX/VISTARIL) 25 MG tablet Take 1 tablet (25 mg total) by mouth 3 (three) times daily as needed for anxiety. 04/28/18   McNew, Tyson Babinski, MD  Insulin Syringe-Needle U-100 (SAFETY INSULIN SYRINGES) 30G X 5/16" 0.5 ML MISC Use with insulin BID with meals 04/28/18   McNew, Tyson Babinski, MD  TRULICITY 1.5 IP/3.8SN SOPN Inject into the skin. 10/02/21   [provider]  valACYclovir (VALTREX) 500 MG tablet Take 1 tablet (500 mg total) by mouth 2 (two) times daily. 08/17/21 08/12/22  Lynden Oxford Scales, PA-C    Family History Family History  Problem Relation Age of Onset   Diabetes Mother    Diabetes Father    Social History Social History   Tobacco Use   Smoking status: Never   Smokeless tobacco: Never  Substance Use Topics   Alcohol use: Yes   Drug use: No   Allergies   Patient  has no known allergies.  Review of Systems Review of Systems Pertinent findings revealed after performing a 14 point review of systems has been noted in the history of present illness.  Physical Exam Triage Vital Signs ED Triage Vitals  Enc Vitals Group     BP 03/24/21 0827 (!) 147/82     Pulse Rate 03/24/21 0827 72     Resp 03/24/21 0827 18     Temp 03/24/21 0827 98.3 F (36.8 C)     Temp Source 03/24/21 0827 Oral     SpO2 03/24/21 0827 98 %     Weight --      Height --      Head Circumference --      Peak Flow --      Pain Score 03/24/21 0826 5     Pain Loc --      Pain Edu? --      Excl. in [place]? --   No data found.  Updated Vital Signs BP 117/86 (BP Location: Right Arm)   Pulse 77    Temp 98.3 F (36.8 C) (Oral)   Resp 18   LMP 03/11/2022   SpO2 96%   Physical Exam Vitals and nursing note reviewed.  Constitutional:      General: She is not in acute distress.    Appearance: Normal appearance. She is not ill-appearing.  HENT:     Head: Normocephalic and atraumatic.     Salivary Glands: Right salivary gland is not diffusely enlarged or tender. Left salivary gland is not diffusely enlarged or tender.     Right Ear: Hearing, ear canal and external ear normal. No drainage. A middle ear effusion is present. There is no impacted cerumen. Tympanic membrane is injected, erythematous and bulging.     Left Ear: Hearing, ear canal and external ear normal. No drainage.  No middle ear effusion. There is no impacted cerumen. Tympanic membrane is bulging. Tympanic membrane is not injected or erythematous.     Nose: Rhinorrhea present. No nasal deformity, septal deviation, signs of injury, laceration, nasal tenderness, mucosal edema or congestion. Rhinorrhea is clear.     Right Nostril: Occlusion present. No foreign body, epistaxis or septal hematoma.     Left Nostril: Occlusion present. No foreign body, epistaxis or septal hematoma.     Right Turbinates: Enlarged, swollen and pale.     Left Turbinates: Enlarged, swollen and pale.     Right Sinus: Maxillary sinus tenderness and frontal sinus tenderness present.     Left Sinus: Maxillary sinus tenderness and frontal sinus tenderness present.     Mouth/Throat:     Lips: Pink. No lesions.     Mouth: Mucous membranes are moist. No oral lesions.     Tongue: No lesions. Tongue does not deviate from midline.     Palate: No mass and lesions.     Pharynx: Oropharynx is clear. Uvula midline. No pharyngeal swelling, oropharyngeal exudate, posterior oropharyngeal erythema or uvula swelling.     Tonsils: No tonsillar exudate or tonsillar abscesses. 0 on the right. 0 on the left.     Comments: Postnasal drip Eyes:     General: Lids are  normal.        Right eye: No discharge.        Left eye: No discharge.     Extraocular Movements: Extraocular movements intact.     Conjunctiva/sclera: Conjunctivae normal.     Right eye: Right conjunctiva is not injected.     Left eye:  Left conjunctiva is not injected.  Neck:     Trachea: Trachea and phonation normal.  Cardiovascular:     Rate and Rhythm: Normal rate and regular rhythm.     Pulses: Normal pulses.     Heart sounds: Normal heart sounds. No murmur heard.    No friction rub. No gallop.  Pulmonary:     Effort: Pulmonary effort is normal. No tachypnea, bradypnea, accessory muscle usage, prolonged expiration, respiratory distress or retractions.     Breath sounds: Normal breath sounds and air entry. No stridor, decreased air movement or transmitted upper airway sounds. No decreased breath sounds, wheezing, rhonchi or rales.  Chest:     Chest wall: No tenderness.  Musculoskeletal:        General: Normal range of motion.     Cervical back: Full passive range of motion without pain, normal range of motion and neck supple. Normal range of motion.  Lymphadenopathy:     Cervical: Cervical adenopathy present.     Right cervical: Superficial cervical adenopathy and posterior cervical adenopathy present.     Left cervical: Superficial cervical adenopathy and posterior cervical adenopathy present.  Skin:    General: Skin is warm and dry.     Findings: No erythema or rash.  Neurological:     General: No focal deficit present.     Mental Status: She is alert and oriented to person, place, and time.  Psychiatric:        Mood and Affect: Mood normal.        Behavior: Behavior normal.     Visual Acuity Right Eye Distance:   Left Eye Distance:   Bilateral Distance:    Right Eye Near:   Left Eye Near:    Bilateral Near:     UC Couse / Diagnostics / Procedures:     Radiology No results found.  Procedures Procedures (including critical care time) EKG  Pending results:   Labs Reviewed - No data to display  Medications Ordered in UC: Medications - No data to display  UC Diagnoses / Final Clinical Impressions(s)   I have reviewed the triage vital signs and the nursing notes.  Pertinent labs & imaging results that were available during my care of the patient were reviewed by me and considered in my medical decision making (see chart for details).    Final diagnoses:  Acute suppurative otitis media of right ear without spontaneous rupture of tympanic membrane, recurrence not specified  Acute recurrent pansinusitis  Seasonal allergic rhinitis, unspecified trigger   Patient advised that the bacterial infection in her right ear is likely related to bacterial sinusitis.  Patient provided with a 10-day course of cefdinir.  Patient also advised to be consistent with taking allergy medications, cetirizine and Flonase on a regular basis to avoid recurrent respiratory infections.  Return precautions advised.  ED Prescriptions     Medication Sig Dispense Auth. Provider   cefdinir (OMNICEF) 300 MG capsule Take 1 capsule (300 mg total) by mouth 2 (two) times daily for 10 days. 20 capsule Lynden Oxford Scales, PA-C   cetirizine (ZYRTEC ALLERGY) 10 MG tablet Take 1 tablet (10 mg total) by mouth at bedtime. 90 tablet Lynden Oxford Scales, PA-C   fluticasone (FLONASE) 50 MCG/ACT nasal spray Place 1 spray into both nostrils daily. Begin by using 2 sprays in each nare daily for 3 to 5 days, then decrease to 1 spray in each nare daily. 15.8 mL Lynden Oxford Scales, PA-C      PDMP  not reviewed this encounter.  Disposition Upon Discharge:  Condition: stable for discharge home Home: take medications as prescribed; routine discharge instructions as discussed; follow up as advised.  Patient presented with an acute illness with associated systemic symptoms and significant discomfort requiring urgent management. In my opinion, this is a condition that a prudent lay person  (someone who possesses an average knowledge of health and medicine) may potentially expect to result in complications if not addressed urgently such as respiratory distress, impairment of bodily function or dysfunction of bodily organs.   Routine symptom specific, illness specific and/or disease specific instructions were discussed with the patient and/or caregiver at length.   As such, the patient has been evaluated and assessed, work-up was performed and treatment was provided in alignment with urgent care protocols and evidence based medicine.  Patient/parent/caregiver has been advised that the patient may require follow up for further testing and treatment if the symptoms continue in spite of treatment, as clinically indicated and appropriate.  If the patient was tested for COVID-19, Influenza and/or RSV, then the patient/parent/guardian was advised to isolate at home pending the results of his/her diagnostic coronavirus test and potentially longer if they're positive. I have also advised pt that if his/her COVID-19 test returns positive, it's recommended to self-isolate for at least 10 days after symptoms first appeared AND until fever-free for 24 hours without fever reducer AND other symptoms have improved or resolved. Discussed self-isolation recommendations as well as instructions for household member/close contacts as per the [facility] and [place] DHHS, and also gave patient the [place] packet with this information.  Patient/parent/caregiver has been advised to return to the [facility] or PCP in 3-5 days if no better; to PCP or the Emergency Department if new signs and symptoms develop, or if the current signs or symptoms continue to change or worsen for further workup, evaluation and treatment as clinically indicated and appropriate  The patient will follow up with their current PCP if and as advised. If the patient does not currently have a PCP we will assist them in obtaining one.   The patient may need  specialty follow up if the symptoms continue, in spite of conservative treatment and management, for further workup, evaluation, consultation and treatment as clinically indicated and appropriate.  Patient/parent/caregiver verbalized understanding and agreement of plan as discussed.  All questions were addressed during visit.  Please see discharge instructions below for further details of plan.  Discharge Instructions:   Discharge Instructions      Your symptoms and my physical exam findings are concerning for exacerbation of your underlying allergies increased your risk of acquiring a sinus infection and now a bacterial infection in your right inner ear.    Please read below to learn more about the medications, dosages and frequencies that I recommend to help alleviate your symptoms and to get you feeling better soon:   Omnicef (cefdinir): To treat the bacterial infection in your right inner ear, please take 1 capsule twice daily for 10 days, you can take it with or without food.  This antibiotic can cause upset stomach, this will resolve once antibiotics are complete.  You are welcome to use a probiotic, eat yogurt, take Imodium while taking this medication.  Please avoid other systemic medications such as Maalox, Pepto-Bismol or milk of magnesia as they can interfere with your body's ability to absorb the antibiotics.      Zyrtec (cetirizine): This is an excellent second-generation antihistamine that helps to reduce respiratory inflammatory response to  environmental allergens.  In some patients, this medication can cause daytime sleepiness so I recommend that you take 1 tablet daily at bedtime.  I recommend that you stay on this medication for the next 30 days   Flonase (fluticasone): This is a steroid nasal spray that you use once daily, 1 spray in each nare.  This medication does not work well if you decide to use it only used as you feel you need to, it works best used on a daily basis.   After 3 to 5 days of use, you will notice significant reduction of the inflammation and mucus production that is currently being caused by exposure to allergens, whether seasonal or environmental.  The most common side effect of this medication is nosebleeds.  If you experience a nosebleed, please discontinue use for 1 week, then feel free to resume.  I have provided you with a prescription.  I recommend that you stay on this medication for the next 30 days.   If you find that you have not had significant relief of your symptoms in the next 7 to 10 days, please follow-up with your primary care provider or return here to urgent care for repeat evaluation and further recommendations.   Thank you for visiting urgent care today.  We appreciate the opportunity to participate in your care.         This office note has been dictated using Museum/gallery curator.  Unfortunately, this method of dictation can sometimes lead to typographical or grammatical errors.  I apologize for your inconvenience in advance if this occurs.  Please do not hesitate to reach out to me if clarification is needed.      Lynden Oxford Scales, PA-C 03/27/22 1918

## 2022-04-15 ENCOUNTER — Other Ambulatory Visit: Payer: Self-pay

## 2022-04-15 ENCOUNTER — Encounter (HOSPITAL_COMMUNITY): Payer: Self-pay | Admitting: *Deleted

## 2022-04-15 ENCOUNTER — Ambulatory Visit (HOSPITAL_COMMUNITY)
Admission: EM | Admit: 2022-04-15 | Discharge: 2022-04-15 | Disposition: A | Discharge status: Home / Self Care | Payer: Medicaid Other | Attending: Family Medicine | Admitting: Family Medicine

## 2022-04-15 DIAGNOSIS — J020 Streptococcal pharyngitis: Secondary | ICD-10-CM

## 2022-04-15 DIAGNOSIS — J4521 Mild intermittent asthma with (acute) exacerbation: Secondary | ICD-10-CM | POA: Diagnosis not present

## 2022-04-15 LAB — POCT RAPID STREP A, ED / UC: Streptococcus, Group A Screen (Direct): POSITIVE — AB

## 2022-04-15 MED ORDER — ALBUTEROL SULFATE HFA 108 (90 BASE) MCG/ACT IN AERS: 2.0000 | INHALATION_SPRAY | RESPIRATORY_TRACT | 0 refills | Status: DC | PRN

## 2022-04-15 MED ORDER — AMOXICILLIN 875 MG PO TABS: 875.0000 mg | ORAL_TABLET | Freq: Two times a day (BID) | ORAL | 0 refills | Status: AC

## 2022-04-15 MED ORDER — PREDNISONE 20 MG PO TABS: 40.0000 mg | ORAL_TABLET | Freq: Every day | ORAL | 0 refills | Status: AC

## 2022-04-15 NOTE — ED Triage Notes (Signed)
Pt reports on going cough, back pain and bil ear pain. Pt was seen on 03-26-22  for same Sx's and Sx's have not improved.

## 2022-04-15 NOTE — ED Provider Notes (Signed)
[place]    CSN: [id number] Arrival date & time: 04/15/22  1111      History   Chief Complaint Chief Complaint  Patient presents with   Sore Throat    Entered by patient   Otalgia   Back Pain    HPI Samantha Bowers is a 26 y.o. female.    Sore Throat  Otalgia Back Pain  Here for sore throat and cough and congestion that started about 2 days ago.  She has not noted any fever.  She has felt short of breath some.  She reports she has not had any history of asthma  Reports exposure to her children.  Her daughter was seen in the emergency room and tested negative for COVID and flu.  Mom was told that she probably "had a virus, so they gave her amoxicillin".  Patient was seen in our urgent care on October 30 and prescribed Omnicef for sinus infection and bilateral ear infection.  To nursing staff she states today that she never got better.  To me she states that the symptoms for which she was seen on October 30 did improve and resolved.  Past Medical History:  Diagnosis Date   Depression    Diabetes mellitus without complication [facility])    gestational    Patient Active Problem List   Diagnosis Date Noted   GAD (generalized anxiety disorder) 04/28/2018   Type 2 diabetes mellitus ([place]) 04/27/2018   Severe recurrent major depression without psychotic features ([place]) 04/26/2018   Depression 11/05/2017   Mastitis during puerperium 11/05/2017   SVD (spontaneous vaginal delivery) 10/12/2017   Pre-existing type 2 diabetes mellitus in pregnancy 10/10/2017   GBS (group B streptococcus) UTI complicating pregnancy 25/00/3704   Pre-existing type 2 diabetes mellitus with hyperglycemia during pregnancy in third trimester ([place]) 07/12/2017   Chlamydia infection complicating pregnancy 88/89/1694   Supervision of high risk pregnancy, antepartum 07/12/2017   Poor glycemic control 07/12/2017    Past Surgical History:  Procedure Laterality Date   NO PAST SURGERIES       OB History     Gravida  1   Para  1   Term  1   Preterm  0   AB  0   Living  1      SAB  0   IAB  0   Ectopic  0   Multiple  0   Live Births  1            Home Medications    Prior to Admission medications   Medication Sig Start Date End Date Taking? Authorizing Provider  albuterol (VENTOLIN HFA) 108 (90 Base) MCG/ACT inhaler Inhale 2 puffs into the lungs every 4 (four) hours as needed for wheezing or shortness of breath. 04/15/22  Yes Barrett Henle, MD  amoxicillin (AMOXIL) 875 MG tablet Take 1 tablet (875 mg total) by mouth 2 (two) times daily for 10 days. 04/15/22 04/25/22 Yes Makaylynn Bonillas, Gwenlyn Perking, MD  predniSONE (DELTASONE) 20 MG tablet Take 2 tablets (40 mg total) by mouth daily with breakfast for 5 days. 04/15/22 04/20/22 Yes Barrett Henle, MD  blood glucose meter kit and supplies KIT Dispense based on patient and insurance preference. Use up to four times daily as directed. (FOR ICD-9 250.00, 250.01). 04/28/18   McNew, Tyson Babinski, MD  cetirizine (ZYRTEC ALLERGY) 10 MG tablet Take 1 tablet (10 mg total) by mouth at bedtime. 03/26/22 09/22/22  Lynden Oxford Scales, PA-C  fluticasone Asencion Islam)  50 MCG/ACT nasal spray Place 1 spray into both nostrils daily. Begin by using 2 sprays in each nare daily for 3 to 5 days, then decrease to 1 spray in each nare daily. 03/26/22   Lynden Oxford Scales, PA-C  glucose blood test strip Use as instructed 10/01/17   Katheren Shams, DO  hydrOXYzine (ATARAX/VISTARIL) 25 MG tablet Take 1 tablet (25 mg total) by mouth 3 (three) times daily as needed for anxiety. 04/28/18   McNew, Tyson Babinski, MD  Insulin Syringe-Needle U-100 (SAFETY INSULIN SYRINGES) 30G X 5/16" 0.5 ML MISC Use with insulin BID with meals 04/28/18   McNew, Tyson Babinski, MD  TRULICITY 1.5 LG/9.2JJ SOPN Inject into the skin. 10/02/21   [provider]  valACYclovir (VALTREX) 500 MG tablet Take 1 tablet (500 mg total) by mouth 2 (two) times daily. 08/17/21 08/12/22   Lynden Oxford Scales, PA-C    Family History Family History  Problem Relation Age of Onset   Diabetes Mother    Diabetes Father     Social History Social History   Tobacco Use   Smoking status: Never   Smokeless tobacco: Never  Substance Use Topics   Alcohol use: Yes   Drug use: No     Allergies   Patient has no known allergies.   Review of Systems Review of Systems  HENT:  Positive for ear pain.   Musculoskeletal:  Positive for back pain.     Physical Exam Triage Vital Signs ED Triage Vitals  Enc Vitals Group     BP 04/15/22 1158 (!) 130/90     Pulse Rate 04/15/22 1158 99     Resp 04/15/22 1158 20     Temp 04/15/22 1158 99.7 F (37.6 C)     Temp src --      SpO2 04/15/22 1158 100 %     Weight --      Height --      Head Circumference --      Peak Flow --      Pain Score 04/15/22 1155 6     Pain Loc --      Pain Edu? --      Excl. in [place]? --    No data found.  Updated Vital Signs BP (!) 130/90   Pulse 99   Temp 99.7 F (37.6 C)   Resp 20   LMP 04/01/2022   SpO2 100%   Visual Acuity Right Eye Distance:   Left Eye Distance:   Bilateral Distance:    Right Eye Near:   Left Eye Near:    Bilateral Near:     Physical Exam Vitals reviewed.  Constitutional:      General: She is not in acute distress.    Appearance: She is not toxic-appearing.  HENT:     Right Ear: Tympanic membrane and ear canal normal.     Left Ear: Tympanic membrane and ear canal normal.     Ears:     Comments: Bilaterally tympanic membranes are gray and shiny.    Nose: Congestion present.     Mouth/Throat:     Mouth: Mucous membranes are moist.     Comments: There is some mild erythema of the posterior oropharynx and some white mucus draining. Eyes:     Extraocular Movements: Extraocular movements intact.     Conjunctiva/sclera: Conjunctivae normal.     Pupils: Pupils are equal, round, and reactive to light.  Cardiovascular:     Rate and Rhythm: Normal rate and  regular rhythm.     Heart sounds: No murmur heard. Pulmonary:     Effort: No respiratory distress.     Breath sounds: No stridor. No rhonchi or rales.     Comments: Air movement is fairly good, but there is a tight wheeze when she coughs while I am examining her Musculoskeletal:     Cervical back: Neck supple.  Lymphadenopathy:     Cervical: No cervical adenopathy.  Skin:    Capillary Refill: Capillary refill takes less than 2 seconds.     Coloration: Skin is not jaundiced or pale.  Neurological:     General: No focal deficit present.     Mental Status: She is alert and oriented to person, place, and time.  Psychiatric:        Behavior: Behavior normal.      UC Treatments / Results  Labs (all labs ordered are listed, but only abnormal results are displayed) Labs Reviewed  POCT RAPID STREP A, ED / UC - Abnormal; Notable for the following components:      Result Value   Streptococcus, Group A Screen (Direct) POSITIVE (*)    All other components within normal limits    EKG   Radiology No results found.  Procedures Procedures (including critical care time)  Medications Ordered in UC Medications - No data to display  Initial Impression / Assessment and Plan / UC Course  I have reviewed the triage vital signs and the nursing notes.  Pertinent labs & imaging results that were available during my care of the patient were reviewed by me and considered in my medical decision making (see chart for details).        Strep is positive, so will treat with Amoxicillin. Also, will treat for asthma/exacerbation with the wheezing heard and shortness of breath Final Clinical Impressions(s) / UC Diagnoses   Final diagnoses:  Strep pharyngitis  Mild intermittent asthma with acute exacerbation     Discharge Instructions      Strep test is positive 2 days into taking the antibiotics, throw away the toothbrush and begin using a new one. Patient is not contagious after 24  hours of antibiotics; a full 10 days should be completed to prevent rheumatic fever  Take amoxicillin 875 mg--1 tab twice daily for 10 days  Take prednisone 20 mg--2 daily for 5 days  Albuterol inhaler--do 2 puffs every 4 hours as needed for shortness of breath or wheezing      ED Prescriptions     Medication Sig Dispense Auth. Provider   albuterol (VENTOLIN HFA) 108 (90 Base) MCG/ACT inhaler Inhale 2 puffs into the lungs every 4 (four) hours as needed for wheezing or shortness of breath. 1 each Barrett Henle, MD   amoxicillin (AMOXIL) 875 MG tablet Take 1 tablet (875 mg total) by mouth 2 (two) times daily for 10 days. 20 tablet Brysan Mcevoy, Gwenlyn Perking, MD   predniSONE (DELTASONE) 20 MG tablet Take 2 tablets (40 mg total) by mouth daily with breakfast for 5 days. 10 tablet Windy Carina Gwenlyn Perking, MD      PDMP not reviewed this encounter.   Barrett Henle, MD 04/15/22 1239

## 2022-04-15 NOTE — Discharge Instructions (Signed)
Strep test is positive 2 days into taking the antibiotics, throw away the toothbrush and begin using a new one. Patient is not contagious after 24 hours of antibiotics; a full 10 days should be completed to prevent rheumatic fever  Take amoxicillin 875 mg--1 tab twice daily for 10 days  Take prednisone 20 mg--2 daily for 5 days  Albuterol inhaler--do 2 puffs every 4 hours as needed for shortness of breath or wheezing

## 2022-04-20 ENCOUNTER — Ambulatory Visit
Admission: RE | Admit: 2022-04-20 | Discharge: 2022-04-20 | Disposition: A | Discharge status: Home / Self Care | Payer: Medicaid Other | Source: Ambulatory Visit | Attending: Urgent Care | Admitting: Urgent Care

## 2022-04-20 VITALS — BP 127/85 | HR 74 | Temp 98.7°F | Resp 16

## 2022-04-20 DIAGNOSIS — B9689 Other specified bacterial agents as the cause of diseases classified elsewhere: Secondary | ICD-10-CM | POA: Diagnosis not present

## 2022-04-20 DIAGNOSIS — N760 Acute vaginitis: Secondary | ICD-10-CM | POA: Diagnosis present

## 2022-04-20 DIAGNOSIS — Z7251 High risk heterosexual behavior: Secondary | ICD-10-CM | POA: Insufficient documentation

## 2022-04-20 MED ORDER — FLUCONAZOLE 150 MG PO TABS: 150.0000 mg | ORAL_TABLET | ORAL | 0 refills | Status: DC

## 2022-04-20 MED ORDER — METRONIDAZOLE 500 MG PO TABS: 500.0000 mg | ORAL_TABLET | Freq: Two times a day (BID) | ORAL | 0 refills | Status: DC

## 2022-04-20 NOTE — ED Provider Notes (Signed)
[facility]  Note:  This document was prepared using Systems analyst and may include unintentional dictation errors.  MRN: [id number] DOB: Mar 22, 1996  Subjective:   Samantha Bowers is a 26 y.o. female presenting for 5-day history of acute onset vaginal discharge, vaginal itching.  Patient has a history of BV infections.  She would like to have STI testing however.  Has unprotected sex with 77 female partner.  No known exposures.  No urinary symptoms.  No fever, nausea, vomiting, abdominal or pelvic pain.  Of note, she has been taking both an antibiotic and steroid as prescribed 04/15/2022.  Prior to that at the end of October she had also undergone a course of an antibiotic for an ear infection.  No current facility-administered medications for this encounter.  Current Outpatient Medications:    albuterol (VENTOLIN HFA) 108 (90 Base) MCG/ACT inhaler, Inhale 2 puffs into the lungs every 4 (four) hours as needed for wheezing or shortness of breath., Disp: 1 each, Rfl: 0   amoxicillin (AMOXIL) 875 MG tablet, Take 1 tablet (875 mg total) by mouth 2 (two) times daily for 10 days., Disp: 20 tablet, Rfl: 0   blood glucose meter kit and supplies KIT, Dispense based on patient and insurance preference. Use up to four times daily as directed. (FOR ICD-9 250.00, 250.01)., Disp: 1 each, Rfl: 0   cetirizine (ZYRTEC ALLERGY) 10 MG tablet, Take 1 tablet (10 mg total) by mouth at bedtime., Disp: 90 tablet, Rfl: 1   fluticasone (FLONASE) 50 MCG/ACT nasal spray, Place 1 spray into both nostrils daily. Begin by using 2 sprays in each nare daily for 3 to 5 days, then decrease to 1 spray in each nare daily., Disp: 15.8 mL, Rfl: 2   glucose blood test strip, Use as instructed, Disp: 50 each, Rfl: 12   hydrOXYzine (ATARAX/VISTARIL) 25 MG tablet, Take 1 tablet (25 mg total) by mouth 3 (three) times daily as needed for anxiety., Disp: 30 tablet, Rfl: 1   Insulin  Syringe-Needle U-100 (SAFETY INSULIN SYRINGES) 30G X 5/16" 0.5 ML MISC, Use with insulin BID with meals, Disp: 60 each, Rfl: 0   predniSONE (DELTASONE) 20 MG tablet, Take 2 tablets (40 mg total) by mouth daily with breakfast for 5 days., Disp: 10 tablet, Rfl: 0   TRULICITY 1.5 CH/8.5ID SOPN, Inject into the skin., Disp: , Rfl:    valACYclovir (VALTREX) 500 MG tablet, Take 1 tablet (500 mg total) by mouth 2 (two) times daily., Disp: 180 tablet, Rfl: 3   No Known Allergies  Past Medical History:  Diagnosis Date   Depression    Diabetes mellitus without complication (HCC)    gestational     Past Surgical History:  Procedure Laterality Date   NO PAST SURGERIES      Family History  Problem Relation Age of Onset   Diabetes Mother    Diabetes Father     Social History   Tobacco Use   Smoking status: Never   Smokeless tobacco: Never  Vaping Use   Vaping Use: Never used  Substance Use Topics   Alcohol use: Yes    Comment: occ   Drug use: No    ROS   Objective:   Vitals: BP 127/85 (BP Location: Left Arm)   Pulse 74   Temp 98.7 F (37.1 C) (Oral)   Resp 16   LMP 04/06/2022   SpO2 99%   Physical Exam Constitutional:      General: She  is not in acute distress.    Appearance: Normal appearance. She is well-developed. She is not ill-appearing, toxic-appearing or diaphoretic.  HENT:     Head: Normocephalic and atraumatic.     Nose: Nose normal.     Mouth/Throat:     Mouth: Mucous membranes are moist.  Eyes:     General: No scleral icterus.       Right eye: No discharge.        Left eye: No discharge.     Extraocular Movements: Extraocular movements intact.     Conjunctiva/sclera: Conjunctivae normal.  Cardiovascular:     Rate and Rhythm: Normal rate.  Pulmonary:     Effort: Pulmonary effort is normal.  Abdominal:     General: Bowel sounds are normal. There is no distension.     Palpations: Abdomen is soft. There is no mass.     Tenderness: There is no  abdominal tenderness. There is no right CVA tenderness, left CVA tenderness, guarding or rebound.  Skin:    General: Skin is warm and dry.  Neurological:     General: No focal deficit present.     Mental Status: She is alert and oriented to person, place, and time.  Psychiatric:        Mood and Affect: Mood normal.        Behavior: Behavior normal.        Thought Content: Thought content normal.        Judgment: Judgment normal.    Assessment and Plan :   PDMP not reviewed this encounter.  1. Bacterial vaginosis   2. Unprotected sex   3. Acute vaginitis     We will treat empirically for bacterial vaginosis and concurrent yeast infection with metronidazole and fluconazole.  Labs pending, will treat as appropriate otherwise.  Deferred pregnancy test given her LMP was just 2 weeks ago.  Counseled patient on potential for adverse effects with medications prescribed/recommended today, ER and return-to-clinic precautions discussed, patient verbalized understanding.    Jaynee Eagles, PA-C 04/20/22 1408

## 2022-04-20 NOTE — ED Triage Notes (Signed)
Pt c/o vaginal itching and discharge x 5 days-NAD-steady gait

## 2022-04-22 ENCOUNTER — Ambulatory Visit (HOSPITAL_COMMUNITY)
Admission: EM | Admit: 2022-04-22 | Discharge: 2022-04-22 | Disposition: A | Discharge status: Home / Self Care | Payer: Medicaid Other

## 2022-04-22 ENCOUNTER — Encounter (HOSPITAL_COMMUNITY): Payer: Self-pay | Admitting: Emergency Medicine

## 2022-04-22 DIAGNOSIS — N898 Other specified noninflammatory disorders of vagina: Secondary | ICD-10-CM

## 2022-04-22 DIAGNOSIS — B3731 Acute candidiasis of vulva and vagina: Secondary | ICD-10-CM | POA: Diagnosis not present

## 2022-04-22 NOTE — Discharge Instructions (Signed)
Continue taking Flagyl as prescribed.  Take next dose of Diflucan next Friday (6 days from now) to fully treat vaginal yeast infection.  I do not see any herpetic lesions to your vaginal area. I recommend you to stop using Darene Lamer to your vaginal area.  Return to urgent care as needed if you experience any new or worsening symptoms.

## 2022-04-22 NOTE — ED Triage Notes (Signed)
Pt reports vaginal swelling and irritation last night. 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 States she was tested on Thursday and still awaiting results from swab. Also reports taking Amoxicillin last week for Strep and using nair last night on vaginal area.

## 2022-04-22 NOTE — ED Provider Notes (Signed)
[place]    CSN: [id number] Arrival date & time: 04/22/22  1214      History   Chief Complaint Chief Complaint  Patient presents with   Groin Swelling    HPI Samantha Bowers is a 26 y.o. female.   Patient presents urgent care for evaluation of vaginal irritation that started last night after she used Carlton Adam hair removal product to the vaginal area.  She states that she usually uses Carlton Adam to remove pubic hair but believes that she may have an open sore and therefore experienced burning and pain after using the product.  She was seen at urgent care [facility] a few days ago on Friday, April 20, 2022 where she was empirically treated for bacterial vaginosis and vaginal yeast infection due to vaginal irritation.  She is currently taking Flagyl twice daily and had her first dose of Diflucan 2 days ago.  History of HSV and she is concerned that the use of the nare may have triggered an outbreak.  Last HSV outbreak was 2 years ago.  STD screening was sent a couple of days ago but is still pending.  No recent new sexual partners in the last 2 days since STI labs were obtained.  Vaginal area is tender, but she is not having any vaginal discharge, odor, or itching.  Denies abdominal pain, nausea, vomiting, fever/chills, and low back pain.  No urinary symptoms.     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   Depression 11/05/2017   Mastitis during puerperium 11/05/2017   SVD (spontaneous vaginal delivery) 10/12/2017   Pre-existing type 2 diabetes mellitus in pregnancy 10/10/2017   GBS (group B streptococcus) UTI complicating pregnancy 70/35/0093   Pre-existing type 2  diabetes mellitus with hyperglycemia during pregnancy in third trimester ([place]) 07/12/2017   Chlamydia infection complicating pregnancy 81/82/9937   Supervision of high risk pregnancy, antepartum 07/12/2017   Poor glycemic control 07/12/2017    Past Surgical History:  Procedure Laterality Date   NO PAST SURGERIES      OB History     Gravida  1   Para  1   Term  1   Preterm  0   AB  0   Living  1      SAB  0   IAB  0   Ectopic  0   Multiple  0   Live Births  1            Home Medications    Prior to Admission medications   Medication Sig Start Date End Date Taking? Authorizing Provider  albuterol (VENTOLIN HFA) 108 (90 Base) MCG/ACT inhaler Inhale 2 puffs into the lungs every 4 (four) hours as needed for wheezing or shortness of breath. 04/15/22   Barrett Henle, MD  amoxicillin (AMOXIL) 875 MG tablet Take 1 tablet (875 mg total) by mouth 2 (two) times daily for 10 days. 04/15/22 04/25/22  Barrett Henle, MD  blood glucose meter kit and supplies KIT Dispense based on patient and insurance preference. Use up to four times daily as directed. (FOR ICD-9 250.00, 250.01). 04/28/18   McNew, Tyson Babinski, MD  cetirizine (ZYRTEC ALLERGY) 10 MG tablet Take  1 tablet (10 mg total) by mouth at bedtime. 03/26/22 09/22/22  Lynden Oxford Scales, PA-C  fluconazole (DIFLUCAN) 150 MG tablet Take 1 tablet (150 mg total) by mouth once a week. 04/20/22   Jaynee Eagles, PA-C  fluticasone (FLONASE) 50 MCG/ACT nasal spray Place 1 spray into both nostrils daily. Begin by using 2 sprays in each nare daily for 3 to 5 days, then decrease to 1 spray in each nare daily. 03/26/22   Lynden Oxford Scales, PA-C  glucose blood test strip Use as instructed 10/01/17   Katheren Shams, DO  hydrOXYzine (ATARAX/VISTARIL) 25 MG tablet Take 1 tablet (25 mg total) by mouth 3 (three) times daily as needed for anxiety. 04/28/18   McNew, Tyson Babinski, MD  Insulin Syringe-Needle U-100 (SAFETY INSULIN SYRINGES) 30G  X 5/16" 0.5 ML MISC Use with insulin BID with meals 04/28/18   McNew, Tyson Babinski, MD  metroNIDAZOLE (FLAGYL) 500 MG tablet Take 1 tablet (500 mg total) by mouth 2 (two) times daily with a meal. DO NOT CONSUME ALCOHOL WHILE TAKING THIS MEDICATION. 04/20/22   Jaynee Eagles, PA-C  TRULICITY 1.5 JS/3.1RX SOPN Inject into the skin. 10/02/21   [provider]  valACYclovir (VALTREX) 500 MG tablet Take 1 tablet (500 mg total) by mouth 2 (two) times daily. 08/17/21 08/12/22  Lynden Oxford Scales, PA-C    Family History Family History  Problem Relation Age of Onset   Diabetes Mother    Diabetes Father     Social History Social History   Tobacco Use   Smoking status: Never   Smokeless tobacco: Never  Vaping Use   Vaping Use: Never used  Substance Use Topics   Alcohol use: Yes    Comment: occ   Drug use: No     Allergies   Patient has no known allergies.   Review of Systems Review of Systems Per HPI  Physical Exam Triage Vital Signs ED Triage Vitals  Enc Vitals Group     BP 04/22/22 1401 135/80     Pulse Rate 04/22/22 1401 81     Resp 04/22/22 1401 17     Temp 04/22/22 1401 98.2 F (36.8 C)     Temp Source 04/22/22 1401 Oral     SpO2 04/22/22 1401 100 %     Weight --      Height --      Head Circumference --      Peak Flow --      Pain Score 04/22/22 1400 0     Pain Loc --      Pain Edu? --      Excl. in [place]? --    No data found.  Updated Vital Signs BP 135/80 (BP Location: Left Arm)   Pulse 81   Temp 98.2 F (36.8 C) (Oral)   Resp 17   LMP 04/06/2022   SpO2 100%   Visual Acuity Right Eye Distance:   Left Eye Distance:   Bilateral Distance:    Right Eye Near:   Left Eye Near:    Bilateral Near:     Physical Exam Vitals and nursing note reviewed. Exam conducted with a chaperone present [facility], CMA).  Constitutional:      Appearance: She is not ill-appearing or toxic-appearing.  HENT:     Head: Normocephalic and atraumatic.     Right Ear: Hearing and  external ear normal.     Left Ear: Hearing and external ear normal.     Nose: Nose normal.  Mouth/Throat:     Lips: Pink.  Eyes:     General: Lids are normal. Vision grossly intact. Gaze aligned appropriately.     Extraocular Movements: Extraocular movements intact.     Conjunctiva/sclera: Conjunctivae normal.  Pulmonary:     Effort: Pulmonary effort is normal.  Genitourinary:    Exam position: Knee-chest position.     Pubic Area: No rash.      Labia:        Right: No tenderness.        Left: No tenderness.      Comments: No herpetic lesions visible to the GU exam.  There is evidence of thick white vaginal discharge at the introitus and to the labia minora consistent with yeast vaginitis. Musculoskeletal:     Cervical back: Neck supple.  Skin:    General: Skin is warm and dry.     Capillary Refill: Capillary refill takes less than 2 seconds.     Findings: No rash.  Neurological:     General: No focal deficit present.     Mental Status: She is alert and oriented to person, place, and time. Mental status is at baseline.     Cranial Nerves: No dysarthria or facial asymmetry.  Psychiatric:        Mood and Affect: Mood normal.        Speech: Speech normal.        Behavior: Behavior normal.        Thought Content: Thought content normal.        Judgment: Judgment normal.      UC Treatments / Results  Labs (all labs ordered are listed, but only abnormal results are displayed) Labs Reviewed - No data to display  EKG   Radiology No results found.  Procedures Procedures (including critical care time)  Medications Ordered in UC Medications - No data to display  Initial Impression / Assessment and Plan / UC Course  I have reviewed the triage vital signs and the nursing notes.  Pertinent labs & imaging results that were available during my care of the patient were reviewed by me and considered in my medical decision making (see chart for details).   1.  Vaginal  irritation and a yeast vaginitis STI testing collected 2 days ago remains pending.  Patient to continue use of Flagyl and Diflucan as prescribed from previous urgent care visit a couple of days ago.  No herpetic lesions visible to vaginal exam today.  Recommend patient stop using Carlton Adam to the vaginal area as this can worsen vaginal irritation.  She is agreeable with this plan.  Discussed physical exam and available lab work findings in clinic with patient.  Counseled patient regarding appropriate use of medications and potential side effects for all medications recommended or prescribed today. Discussed red flag signs and symptoms of worsening condition,when to call the PCP office, return to urgent care, and when to seek higher level of care in the emergency department. Patient verbalizes understanding and agreement with plan. All questions answered. Patient discharged in stable condition.    Final Clinical Impressions(s) / UC Diagnoses   Final diagnoses:  Vaginal irritation  Yeast vaginitis     Discharge Instructions      Continue taking Flagyl as prescribed.  Take next dose of Diflucan next Friday (6 days from now) to fully treat vaginal yeast infection.  I do not see any herpetic lesions to your vaginal area. I recommend you to stop using Carlton Adam to your vaginal area.  Return to urgent care as needed if you experience any new or worsening symptoms.     ED Prescriptions   None    PDMP not reviewed this encounter.   Talbot Grumbling, [place] 04/22/22 1441

## 2022-04-23 ENCOUNTER — Ambulatory Visit (HOSPITAL_COMMUNITY): Payer: Self-pay

## 2022-04-23 LAB — CERVICOVAGINAL ANCILLARY ONLY
Bacterial Vaginitis (gardnerella): POSITIVE — AB
Candida Glabrata: NEGATIVE
Candida Vaginitis: POSITIVE — AB
Chlamydia: NEGATIVE
Comment: NEGATIVE
Comment: NEGATIVE
Comment: NEGATIVE
Comment: NEGATIVE
Comment: NEGATIVE
Comment: NORMAL
Neisseria Gonorrhea: NEGATIVE
Trichomonas: NEGATIVE

## 2022-05-05 ENCOUNTER — Encounter (HOSPITAL_COMMUNITY): Payer: Self-pay

## 2022-05-05 ENCOUNTER — Emergency Department (HOSPITAL_COMMUNITY)
Admission: EM | Admit: 2022-05-05 | Discharge: 2022-05-06 | Disposition: A | Discharge status: Home / Self Care | Payer: Medicaid Other | Attending: Emergency Medicine | Admitting: Emergency Medicine

## 2022-05-05 DIAGNOSIS — M79641 Pain in right hand: Secondary | ICD-10-CM | POA: Diagnosis present

## 2022-05-05 DIAGNOSIS — Z794 Long term (current) use of insulin: Secondary | ICD-10-CM | POA: Diagnosis not present

## 2022-05-05 DIAGNOSIS — R509 Fever, unspecified: Secondary | ICD-10-CM | POA: Diagnosis not present

## 2022-05-05 DIAGNOSIS — E1165 Type 2 diabetes mellitus with hyperglycemia: Secondary | ICD-10-CM | POA: Insufficient documentation

## 2022-05-05 DIAGNOSIS — Z1152 Encounter for screening for COVID-19: Secondary | ICD-10-CM | POA: Diagnosis not present

## 2022-05-05 DIAGNOSIS — R739 Hyperglycemia, unspecified: Secondary | ICD-10-CM

## 2022-05-05 DIAGNOSIS — L03011 Cellulitis of right finger: Secondary | ICD-10-CM

## 2022-05-05 NOTE — ED Triage Notes (Signed)
Pt presents with c/o right finger pain. Pt initially checked in for seizures but said that her seizures occurred 2 days ago, new onset and she was prescribed medicine for the seizures. Pt's right thumb is swollen.

## 2022-05-05 NOTE — ED Provider Triage Note (Signed)
Emergency Medicine Provider Triage Evaluation Note  Samantha Bowers , a 26 y.o. female  was evaluated in triage.  Pt complains of right thumb swelling and pain. Patient also complains of feeling lethargic secondary to seizures that were diagnosed for the first time earlier this week. Patient is taking Keppra. Patient also complains of generalized body aches, headache. Denies shortness of breath, chest pain, abdominal pain, nausea, vomiting  Review of Systems  Positive: As above Negative: As above  Physical Exam  BP 126/85 (BP Location: Left Arm)   Pulse (!) 108   Temp (!) 101.5 F (38.6 C) (Oral)   Resp 18   LMP 05/05/2022   SpO2 100%  Gen:   Awake, no distress   Resp:  Normal effort  MSK:   Moves extremities without difficulty  Other:  Swelling noted to distal right thumb consistent with paronychia  Medical Decision Making  Medically screening exam initiated at 3:09 PM.  Appropriate orders placed.  Shiane Wenberg was informed that the remainder of the evaluation will be completed by another provider, this initial triage assessment does not replace that evaluation, and the importance of remaining in the ED until their evaluation is complete.     Darrick Grinder, PA-C 05/05/22 1511

## 2022-05-06 LAB — CBC WITH DIFFERENTIAL/PLATELET
Abs Immature Granulocytes: 0.05 10*3/uL (ref 0.00–0.07)
Basophils Absolute: 0 10*3/uL (ref 0.0–0.1)
Basophils Relative: 0 %
Eosinophils Absolute: 0 10*3/uL (ref 0.0–0.5)
Eosinophils Relative: 0 %
HCT: 37.6 % (ref 36.0–46.0)
Hemoglobin: 12.5 g/dL (ref 12.0–15.0)
Immature Granulocytes: 0 %
Lymphocytes Relative: 7 %
Lymphs Abs: 0.8 10*3/uL (ref 0.7–4.0)
MCH: 30.9 pg (ref 26.0–34.0)
MCHC: 33.2 g/dL (ref 30.0–36.0)
MCV: 93.1 fL (ref 80.0–100.0)
Monocytes Absolute: 0.9 10*3/uL (ref 0.1–1.0)
Monocytes Relative: 8 %
Neutro Abs: 10 10*3/uL — ABNORMAL HIGH (ref 1.7–7.7)
Neutrophils Relative %: 85 %
Platelets: 205 10*3/uL (ref 150–400)
RBC: 4.04 MIL/uL (ref 3.87–5.11)
RDW: 12.2 % (ref 11.5–15.5)
WBC: 11.7 10*3/uL — ABNORMAL HIGH (ref 4.0–10.5)
nRBC: 0 % (ref 0.0–0.2)

## 2022-05-06 LAB — COMPREHENSIVE METABOLIC PANEL
ALT: 18 U/L (ref 0–44)
AST: 21 U/L (ref 15–41)
Albumin: 3.8 g/dL (ref 3.5–5.0)
Alkaline Phosphatase: 43 U/L (ref 38–126)
Anion gap: 8 (ref 5–15)
BUN: 6 mg/dL (ref 6–20)
CO2: 23 mmol/L (ref 22–32)
Calcium: 8.7 mg/dL — ABNORMAL LOW (ref 8.9–10.3)
Chloride: 101 mmol/L (ref 98–111)
Creatinine, Ser: 0.7 mg/dL (ref 0.44–1.00)
GFR, Estimated: >60 mL/min (ref >60)
Glucose, Bld: 298 mg/dL — ABNORMAL HIGH (ref 70–99)
Potassium: 3.4 mmol/L — ABNORMAL LOW (ref 3.5–5.1)
Sodium: 132 mmol/L — ABNORMAL LOW (ref 135–145)
Total Bilirubin: 0.9 mg/dL (ref 0.3–1.2)
Total Protein: 7.2 g/dL (ref 6.5–8.1)

## 2022-05-06 LAB — BLOOD GAS, VENOUS
Acid-Base Excess: 4.9 mmol/L — ABNORMAL HIGH (ref 0.0–2.0)
Bicarbonate: 29.2 mmol/L — ABNORMAL HIGH (ref 20.0–28.0)
O2 Saturation: 79.2 %
Patient temperature: 37
pCO2, Ven: 41 mmHg — ABNORMAL LOW (ref 44–60)
pH, Ven: 7.46 — ABNORMAL HIGH (ref 7.25–7.43)
pO2, Ven: 43 mmHg (ref 32–45)

## 2022-05-06 LAB — URINALYSIS, ROUTINE W REFLEX MICROSCOPIC
Bacteria, UA: NONE SEEN
Bilirubin Urine: NEGATIVE
Glucose, UA: >=500 mg/dL — AB
Ketones, ur: NEGATIVE mg/dL
Nitrite: NEGATIVE
Protein, ur: NEGATIVE mg/dL
Specific Gravity, Urine: 1.015 (ref 1.005–1.030)
pH: 6 (ref 5.0–8.0)

## 2022-05-06 LAB — RESP PANEL BY RT-PCR (RSV, FLU A&B, COVID)  RVPGX2
Influenza A by PCR: NEGATIVE
Influenza B by PCR: NEGATIVE
Resp Syncytial Virus by PCR: NEGATIVE
SARS Coronavirus 2 by RT PCR: NEGATIVE

## 2022-05-06 MED ORDER — ACETAMINOPHEN 500 MG PO TABS
1000.0000 mg | ORAL_TABLET | Freq: Once | ORAL | Status: AC
  Administered 2022-05-06: 1000 mg via ORAL
  Filled 2022-05-06: qty 2

## 2022-05-06 MED ORDER — LIDOCAINE-EPINEPHRINE-TETRACAINE (LET) TOPICAL GEL
3.0000 mL | Freq: Once | TOPICAL | Status: AC
  Administered 2022-05-06: 3 mL via TOPICAL
  Filled 2022-05-06: qty 3

## 2022-05-06 MED ORDER — ONDANSETRON HCL 4 MG/2ML IJ SOLN
4.0000 mg | Freq: Once | INTRAMUSCULAR | Status: AC
  Administered 2022-05-06: 4 mg via INTRAVENOUS
  Filled 2022-05-06: qty 2

## 2022-05-06 MED ORDER — METOCLOPRAMIDE HCL 5 MG/ML IJ SOLN: 10.0000 mg | Freq: Once | INTRAMUSCULAR | Status: DC

## 2022-05-06 MED ORDER — SULFAMETHOXAZOLE-TRIMETHOPRIM 800-160 MG PO TABS: 1.0000 | ORAL_TABLET | Freq: Two times a day (BID) | ORAL | 0 refills | Status: AC

## 2022-05-06 MED ORDER — SODIUM CHLORIDE 0.9 % IV BOLUS
1000.0000 mL | Freq: Once | INTRAVENOUS | Status: AC
  Administered 2022-05-06: 1000 mL via INTRAVENOUS

## 2022-05-06 NOTE — ED Provider Notes (Signed)
[facility] DEPT Provider Note  CSN: [id number] Arrival date & time: 05/05/22 1453  Chief Complaint(s) Hand Pain  HPI Samantha Bowers is a 26 y.o. female {Add pertinent medical, surgical, social history, OB history to HPI:1}    Hand Pain   Past Medical History Past Medical History:  Diagnosis Date  . Depression   . Diabetes mellitus without complication [facility])    gestational   Patient Active Problem List   Diagnosis Date Noted  . GAD (generalized anxiety disorder) 04/28/2018  . Type 2 diabetes mellitus ([place]) 04/27/2018  . Severe recurrent major depression without psychotic features ([place]) 04/26/2018  . Depression 11/05/2017  . Mastitis during puerperium 11/05/2017  . SVD (spontaneous vaginal delivery) 10/12/2017  . Pre-existing type 2 diabetes mellitus in pregnancy 10/10/2017  . GBS (group B streptococcus) UTI complicating pregnancy 38/45/3646  . Pre-existing type 2 diabetes mellitus with hyperglycemia during pregnancy in third trimester ([place]) 07/12/2017  . Chlamydia infection complicating pregnancy 80/32/1224  . Supervision of high risk pregnancy, antepartum 07/12/2017  . Poor glycemic control 07/12/2017   Home Medication(s) Prior to Admission medications   Medication Sig Start Date End Date Taking? Authorizing Provider  albuterol (VENTOLIN HFA) 108 (90 Base) MCG/ACT inhaler Inhale 2 puffs into the lungs every 4 (four) hours as needed for wheezing or shortness of breath. 04/15/22   Barrett Henle, MD  blood glucose meter kit and supplies KIT Dispense based on patient and insurance preference. Use up to four times daily as directed. (FOR ICD-9 250.00, 250.01). 04/28/18   McNew, Tyson Babinski, MD  cetirizine (ZYRTEC ALLERGY) 10 MG tablet Take 1 tablet (10 mg total) by mouth at bedtime. 03/26/22 09/22/22  Lynden Oxford Scales, PA-C  fluconazole (DIFLUCAN) 150 MG tablet Take 1 tablet (150 mg total) by mouth once a week. 04/20/22   Jaynee Eagles,  PA-C  fluticasone (FLONASE) 50 MCG/ACT nasal spray Place 1 spray into both nostrils daily. Begin by using 2 sprays in each nare daily for 3 to 5 days, then decrease to 1 spray in each nare daily. 03/26/22   Lynden Oxford Scales, PA-C  glucose blood test strip Use as instructed 10/01/17   Katheren Shams, DO  hydrOXYzine (ATARAX/VISTARIL) 25 MG tablet Take 1 tablet (25 mg total) by mouth 3 (three) times daily as needed for anxiety. 04/28/18   McNew, Tyson Babinski, MD  Insulin Syringe-Needle U-100 (SAFETY INSULIN SYRINGES) 30G X 5/16" 0.5 ML MISC Use with insulin BID with meals 04/28/18   McNew, Tyson Babinski, MD  metroNIDAZOLE (FLAGYL) 500 MG tablet Take 1 tablet (500 mg total) by mouth 2 (two) times daily with a meal. DO NOT CONSUME ALCOHOL WHILE TAKING THIS MEDICATION. 04/20/22   Jaynee Eagles, PA-C  TRULICITY 1.5 MG/5.0IB SOPN Inject into the skin. 10/02/21   [provider]  valACYclovir (VALTREX) 500 MG tablet Take 1 tablet (500 mg total) by mouth 2 (two) times daily. 08/17/21 08/12/22  Lynden Oxford Scales, PA-C  Allergies Patient has no known allergies.  Review of Systems Review of Systems As noted in HPI  Physical Exam Vital Signs  I have reviewed the triage vital signs BP (!) 130/57   Pulse (!) 116   Temp 99.9 F (37.7 C) (Oral)   Resp 18   LMP 05/05/2022   SpO2 99%  *** Physical Exam  ED Results and Treatments Labs (all labs ordered are listed, but only abnormal results are displayed) Labs Reviewed  RESP PANEL BY RT-PCR (RSV, FLU A&B, COVID)  [id number]  BASIC METABOLIC PANEL  CBC WITH DIFFERENTIAL/PLATELET  URINALYSIS, ROUTINE W REFLEX MICROSCOPIC                                                                                                                         EKG  EKG Interpretation  Date/Time:    Ventricular Rate:    PR Interval:    QRS  Duration:   QT Interval:    QTC Calculation:   R Axis:     Text Interpretation:         Radiology No results found.  Medications Ordered in ED Medications - No data to display                                                                                                                                   Procedures Procedures  (including critical care time)  Medical Decision Making / ED Course   Medical Decision Making Amount and/or Complexity of Data Reviewed Labs: ordered.  Risk OTC drugs. Prescription drug management.          Final Clinical Impression(s) / ED Diagnoses Final diagnoses:  None    {Document critical care time when appropriate:1}  {Document review of labs and clinical decision tools ie heart score, Chads2Vasc2 etc:1}  {Document your independent review of radiology images, and any outside records:1} {Document your discussion with family members, caretakers, and with consultants:1} {Document social determinants of health affecting pt's care:1} {Document your decision making why or why not admission, treatments were needed:1} This chart was dictated using voice recognition software.  Despite best efforts to proofread,  errors can occur which can change the documentation meaning.

## 2022-05-25 ENCOUNTER — Encounter (HOSPITAL_COMMUNITY): Payer: Self-pay | Admitting: Emergency Medicine

## 2022-05-25 ENCOUNTER — Ambulatory Visit (HOSPITAL_COMMUNITY)
Admission: EM | Admit: 2022-05-25 | Discharge: 2022-05-25 | Disposition: A | Discharge status: Home / Self Care | Payer: Medicaid Other | Attending: Emergency Medicine | Admitting: Emergency Medicine

## 2022-05-25 DIAGNOSIS — Z1152 Encounter for screening for COVID-19: Secondary | ICD-10-CM | POA: Diagnosis not present

## 2022-05-25 DIAGNOSIS — J020 Streptococcal pharyngitis: Secondary | ICD-10-CM | POA: Insufficient documentation

## 2022-05-25 DIAGNOSIS — J029 Acute pharyngitis, unspecified: Secondary | ICD-10-CM | POA: Diagnosis present

## 2022-05-25 LAB — POC INFLUENZA A AND B ANTIGEN (URGENT CARE ONLY)
INFLUENZA A ANTIGEN, POC: NEGATIVE
INFLUENZA B ANTIGEN, POC: NEGATIVE

## 2022-05-25 LAB — POCT RAPID STREP A, ED / UC: Streptococcus, Group A Screen (Direct): POSITIVE — AB

## 2022-05-25 MED ORDER — IBUPROFEN 800 MG PO TABS
800.0000 mg | ORAL_TABLET | Freq: Once | ORAL | Status: AC
  Administered 2022-05-25: 800 mg via ORAL

## 2022-05-25 MED ORDER — AMOXICILLIN 500 MG PO CAPS: 500.0000 mg | ORAL_CAPSULE | Freq: Two times a day (BID) | ORAL | 0 refills | Status: AC

## 2022-05-25 MED ORDER — IBUPROFEN 800 MG PO TABS
ORAL_TABLET | ORAL | Status: AC
  Filled 2022-05-25: qty 1

## 2022-05-25 NOTE — ED Triage Notes (Signed)
Pt c/o sore throat, cough, fevers for 2 days. Last dose tylenol was 5 am today.

## 2022-05-25 NOTE — ED Provider Notes (Addendum)
[place]    CSN: [id number] Arrival date & time: 05/25/22  1126     History   Chief Complaint Chief Complaint  Patient presents with   Cough   Sore Throat   Fever    HPI Samantha Bowers is a 26 y.o. female.  Presents with 1 day history of sore throat and fever, body aches Tmax 102 yesterday 9/10 pain with swallowing, mostly on the right Has been using tylenol. Last dose early this morning   No known sick contacts  Had strep Nov 19th, treated with amox She did not change her toothbrush  Past Medical History:  Diagnosis Date   Depression    Diabetes mellitus without complication ([place])    gestational    Patient Active Problem List   Diagnosis Date Noted   GAD (generalized anxiety disorder) 04/28/2018   Type 2 diabetes mellitus (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/26/2018   Depression 11/05/2017   Mastitis during puerperium 11/05/2017   SVD (spontaneous vaginal delivery) 10/12/2017   Pre-existing type 2 diabetes mellitus in pregnancy 10/10/2017   GBS (group B streptococcus) UTI complicating pregnancy 68/04/7516   Pre-existing type 2 diabetes mellitus with hyperglycemia during pregnancy in third trimester ([place]) 07/12/2017   Chlamydia infection complicating pregnancy 00/17/4944   Supervision of high risk pregnancy, antepartum 07/12/2017   Poor glycemic control 07/12/2017    Past Surgical History:  Procedure Laterality Date   NO PAST SURGERIES      OB History     Gravida  1   Para  1   Term  1   Preterm  0   AB  0   Living  1      SAB  0   IAB  0   Ectopic  0   Multiple  0   Live Births  1            Home Medications    Prior to Admission medications   Medication Sig Start Date End Date Taking? Authorizing Provider  amoxicillin (AMOXIL) 500 MG capsule Take 1 capsule (500 mg total) by mouth 2 (two) times daily for 10 days. 05/25/22 06/04/22 Yes Karelyn Brisby, Wells Guiles,  PA-C  albuterol (VENTOLIN HFA) 108 (90 Base) MCG/ACT inhaler Inhale 2 puffs into the lungs every 4 (four) hours as needed for wheezing or shortness of breath. Patient not taking: Reported on 05/06/2022 04/15/22   Barrett Henle, MD  blood glucose meter kit and supplies KIT Dispense based on patient and insurance preference. Use up to four times daily as directed. (FOR ICD-9 250.00, 250.01). 04/28/18   McNew, Tyson Babinski, MD  cetirizine (ZYRTEC ALLERGY) 10 MG tablet Take 1 tablet (10 mg total) by mouth at bedtime. Patient not taking: Reported on 05/06/2022 03/26/22 09/22/22  Lynden Oxford Scales, PA-C  fluconazole (DIFLUCAN) 150 MG tablet Take 1 tablet (150 mg total) by mouth once a week. Patient not taking: Reported on 05/06/2022 04/20/22   Jaynee Eagles, PA-C  fluticasone [facility]) 50 MCG/ACT nasal spray Place 1 spray into both nostrils daily. Begin by using 2 sprays in each nare daily for 3 to 5 days, then decrease to 1 spray in each nare daily. Patient not taking: Reported on 05/06/2022 03/26/22   Lynden Oxford Scales, PA-C  glucose blood test strip Use as instructed 10/01/17   Katheren Shams, DO  hydrOXYzine (ATARAX/VISTARIL) 25 MG tablet Take 1 tablet (25 mg total) by mouth 3 (three) times daily as needed  for anxiety. Patient not taking: Reported on 05/06/2022 04/28/18   Marylin Crosby, MD  Insulin Syringe-Needle U-100 (SAFETY INSULIN SYRINGES) 30G X 5/16" 0.5 ML MISC Use with insulin BID with meals 04/28/18   McNew, Tyson Babinski, MD  levETIRAcetam (KEPPRA) 500 MG tablet Take 500 mg by mouth 2 (two) times daily. 05/03/22   [provider]  metroNIDAZOLE (FLAGYL) 500 MG tablet Take 1 tablet (500 mg total) by mouth 2 (two) times daily with a meal. DO NOT CONSUME ALCOHOL WHILE TAKING THIS MEDICATION. Patient not taking: Reported on 05/06/2022 04/20/22   Jaynee Eagles, PA-C  TRULICITY 4.5 NL/9.7QB SOPN Inject 0.5 mLs into the skin once a week.    [provider]  valACYclovir (VALTREX) 500  MG tablet Take 1 tablet (500 mg total) by mouth 2 (two) times daily. Patient not taking: Reported on 05/06/2022 08/17/21 08/12/22  Lynden Oxford Scales, PA-C    Family History Family History  Problem Relation Age of Onset   Diabetes Mother    Diabetes Father     Social History Social History   Tobacco Use   Smoking status: Never   Smokeless tobacco: Never  Vaping Use   Vaping Use: Never used  Substance Use Topics   Alcohol use: Yes    Comment: occ   Drug use: No     Allergies   Patient has no known allergies.   Review of Systems Review of Systems As per HPI  Physical Exam Triage Vital Signs ED Triage Vitals  Enc Vitals Group     BP      Pulse      Resp      Temp      Temp src      SpO2      Weight      Height      Head Circumference      Peak Flow      Pain Score      Pain Loc      Pain Edu?      Excl. in [place]?    No data found.  Updated Vital Signs BP 109/74 (BP Location: Left Arm)   Pulse 84   Temp 97.9 F (36.6 C) (Oral)   Resp 16   LMP 05/02/2022   SpO2 97%    Physical Exam Vitals and nursing note reviewed.  Constitutional:      General: She is not in acute distress.    Comments: Tolerate secretions, normal phonation, no respiratory distress, airway patent  HENT:     Nose: No rhinorrhea.     Mouth/Throat:     Mouth: Mucous membranes are moist.     Pharynx: Oropharynx is clear. Uvula midline. Posterior oropharyngeal erythema present. No uvula swelling.     Tonsils: No tonsillar exudate or tonsillar abscesses. 1+ on the right. 1+ on the left.  Eyes:     Conjunctiva/sclera: Conjunctivae normal.  Cardiovascular:     Rate and Rhythm: Normal rate and regular rhythm.     Pulses: Normal pulses.     Heart sounds: Normal heart sounds.  Pulmonary:     Effort: Pulmonary effort is normal.     Breath sounds: Normal breath sounds.  Musculoskeletal:     Cervical back: Normal range of motion.  Lymphadenopathy:     Cervical: No cervical  adenopathy.  Skin:    General: Skin is warm and dry.  Neurological:     Mental Status: She is alert and oriented to person, place, and  time.     UC Treatments / Results  Labs (all labs ordered are listed, but only abnormal results are displayed) Labs Reviewed  POCT RAPID STREP A, ED / UC - Abnormal; Notable for the following components:      Result Value   Streptococcus, Group A Screen (Direct) POSITIVE (*)    All other components within normal limits  SARS CORONAVIRUS 2 (TAT 6-24 HRS)  POC INFLUENZA A AND B ANTIGEN (URGENT CARE ONLY)    EKG  Radiology No results found.  Procedures Procedures (including critical care time)  Medications Ordered in UC Medications  ibuprofen (ADVIL) tablet 800 mg (800 mg Oral Given 05/25/22 1420)    Initial Impression / Assessment and Plan / UC Course  I have reviewed the triage vital signs and the nursing notes.  Pertinent labs & imaging results that were available during my care of the patient were reviewed by me and considered in my medical decision making (see chart for details).  Patient would like flu and covid testing today. Ibuprofen dose given in clinic for pain.  Strep test in clinic positive, seen by this provider with very faint almost unnoticeable line. Will treat with amox BID x 10 days. Rapid flu negative. COVID pending per patient request. Discussed continuing symptomatic care at home.  Recommend alternating Tylenol and ibuprofen, increasing fluid intake, monitoring for worsening symptoms. Return precautions discussed. Patient agrees to plan  Final Clinical Impressions(s) / UC Diagnoses   Final diagnoses:  Strep pharyngitis     Discharge Instructions      Strep test was very faintly positive. I am treating you with amoxicillin. Please take the medication for the full 10 days. It's important to finish all 10 days even when you are feeling better. Otherwise the infection can come back worse. Make sure to change your  toothbrush! Take medicine with food to avoid upset stomach.  Flu test was negative.  We will call you if your covid test returns positive.      ED Prescriptions     Medication Sig Dispense Auth. Provider   amoxicillin (AMOXIL) 500 MG capsule Take 1 capsule (500 mg total) by mouth 2 (two) times daily for 10 days. 20 capsule Miliani Deike, Wells Guiles, PA-C      PDMP not reviewed this encounter.   Michaiah Maiden, Vernice [place] 05/25/22 1444    Gracynn Rajewski, Wells Guiles, [place] 05/25/22 1506

## 2022-05-25 NOTE — Discharge Instructions (Addendum)
Strep test was very faintly positive. I am treating you with amoxicillin. Please take the medication for the full 10 days. It's important to finish all 10 days even when you are feeling better. Otherwise the infection can come back worse. Make sure to change your toothbrush! Take medicine with food to avoid upset stomach.  Flu test was negative.  We will call you if your covid test returns positive.

## 2022-05-26 LAB — SARS CORONAVIRUS 2 (TAT 6-24 HRS): SARS Coronavirus 2: NEGATIVE

## 2022-06-26 ENCOUNTER — Ambulatory Visit (HOSPITAL_COMMUNITY)
Admission: EM | Admit: 2022-06-26 | Discharge: 2022-06-26 | Disposition: A | Discharge status: Home / Self Care | Payer: Medicaid Other | Attending: Family Medicine | Admitting: Family Medicine

## 2022-06-26 ENCOUNTER — Encounter (HOSPITAL_COMMUNITY): Payer: Self-pay

## 2022-06-26 DIAGNOSIS — Z1152 Encounter for screening for COVID-19: Secondary | ICD-10-CM | POA: Diagnosis present

## 2022-06-26 DIAGNOSIS — N912 Amenorrhea, unspecified: Secondary | ICD-10-CM

## 2022-06-26 DIAGNOSIS — J069 Acute upper respiratory infection, unspecified: Secondary | ICD-10-CM

## 2022-06-26 LAB — POC URINE PREG, ED
Preg Test, Ur: NEGATIVE
Preg Test, Ur: NEGATIVE

## 2022-06-26 LAB — POC INFLUENZA A AND B ANTIGEN (URGENT CARE ONLY)
INFLUENZA A ANTIGEN, POC: NEGATIVE
INFLUENZA B ANTIGEN, POC: NEGATIVE

## 2022-06-26 LAB — POCT RAPID STREP A, ED / UC: Streptococcus, Group A Screen (Direct): NEGATIVE

## 2022-06-26 NOTE — Discharge Instructions (Addendum)
You were seen today for upper respiratory symptoms.  Your strep test was negative and will be sent for culture.  Your flu swab was negative.   Your covid swab will be resulted tomorrow.  You will be notified of any positive results needing treatment.  I recommend tyelnol and motrin to help with body aches and fever.  You may trial over the counter mucinex to help with cough.  This could just be another virus causing symptoms.  Please return if you are having worsening symptoms despite medication use.

## 2022-06-26 NOTE — ED Provider Notes (Addendum)
[place]    CSN: [id number] Arrival date & time: 06/26/22  1010      History   Chief Complaint Chief Complaint  Patient presents with   Cough    HPI Ithzel Fedorchak is a 27 y.o. female.   Patient is here for URI symptoms.  Yesterday started sore throat, cough, body aches.  No fevers/chills.  + runny nose, congestion.  No otc meds taken.   Covid and flu around work.  She has had strep throat the last several months, and wants to make sure this has cleared as well.    Wanting pregnancy test as her period is late.     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   Depression 11/05/2017   Mastitis during puerperium 11/05/2017   SVD (spontaneous vaginal delivery) 10/12/2017   Pre-existing type 2 diabetes mellitus in pregnancy 10/10/2017   GBS (group B streptococcus) UTI complicating pregnancy 62/13/0865   Pre-existing type 2 diabetes mellitus with hyperglycemia during pregnancy in third trimester ([place]) 07/12/2017   Chlamydia infection complicating pregnancy 78/46/9629   Supervision of high risk pregnancy, antepartum 07/12/2017   Poor glycemic control 07/12/2017    Past Surgical History:  Procedure Laterality Date   NO PAST SURGERIES      OB History     Gravida  1   Para  1   Term  1   Preterm  0   AB  0   Living  1      SAB  0   IAB  0   Ectopic  0   Multiple  0   Live Births  1            Home Medications    Prior to Admission medications   Medication Sig Start Date End Date Taking? Authorizing Provider  albuterol (VENTOLIN HFA) 108 (90 Base) MCG/ACT inhaler Inhale 2 puffs into the lungs every 4 (four) hours as needed  for wheezing or shortness of breath. Patient not taking: Reported on 05/06/2022 04/15/22   Barrett Henle, MD  blood glucose meter kit and supplies KIT Dispense based on patient and insurance preference. Use up to four times daily as directed. (FOR ICD-9 250.00, 250.01). 04/28/18   McNew, Tyson Babinski, MD  cetirizine (ZYRTEC ALLERGY) 10 MG tablet Take 1 tablet (10 mg total) by mouth at bedtime. Patient not taking: Reported on 05/06/2022 03/26/22 09/22/22  Lynden Oxford Scales, PA-C  fluconazole (DIFLUCAN) 150 MG tablet Take 1 tablet (150 mg total) by mouth once a week. Patient not taking: Reported on 05/06/2022 04/20/22   Jaynee Eagles, PA-C  fluticasone [facility]) 50 MCG/ACT nasal spray Place 1 spray into both nostrils daily. Begin by using 2 sprays in each nare daily for 3 to 5 days, then decrease to 1 spray in each nare daily. Patient not taking: Reported on 05/06/2022 03/26/22   Lynden Oxford Scales, PA-C  glucose blood test strip Use as instructed 10/01/17   Katheren Shams, DO  hydrOXYzine (ATARAX/VISTARIL) 25 MG tablet Take 1 tablet (25 mg total) by mouth 3 (three) times daily as needed for anxiety. Patient not taking: Reported on 05/06/2022 04/28/18   Marylin Crosby, MD  Insulin Syringe-Needle U-100 (SAFETY INSULIN SYRINGES) 30G X 5/16" 0.5 ML MISC Use with insulin BID with meals 04/28/18   McNew, Tyson Babinski, MD  levETIRAcetam (KEPPRA) 500 MG tablet Take 500 mg by mouth 2 (two) times daily. 05/03/22   [provider]  metroNIDAZOLE (FLAGYL) 500 MG tablet Take 1 tablet (500 mg total) by mouth 2 (two) times daily with a meal. DO NOT CONSUME ALCOHOL WHILE TAKING THIS MEDICATION. Patient not taking: Reported on 05/06/2022 04/20/22   Jaynee Eagles, PA-C  TRULICITY 4.5 BU/3.8GT SOPN Inject 0.5 mLs into the skin once a week.    [provider]  valACYclovir (VALTREX) 500 MG tablet Take 1 tablet (500 mg total) by mouth 2 (two) times daily. Patient not taking: Reported on 05/06/2022 08/17/21  08/12/22  Lynden Oxford Scales, PA-C    Family History Family History  Problem Relation Age of Onset   Diabetes Mother    Diabetes Father     Social History Social History   Tobacco Use   Smoking status: Never   Smokeless tobacco: Never  Vaping Use   Vaping Use: Never used  Substance Use Topics   Alcohol use: Yes    Comment: occ   Drug use: No     Allergies   Patient has no known allergies.   Review of Systems Review of Systems  Constitutional:  Negative for chills and fatigue.  HENT:  Positive for congestion, rhinorrhea and sore throat.   Respiratory:  Positive for cough.   Cardiovascular: Negative.   Gastrointestinal: Negative.   Musculoskeletal: Negative.   Psychiatric/Behavioral: Negative.       Physical Exam Triage Vital Signs ED Triage Vitals  Enc Vitals Group     BP 06/26/22 1224 (!) 143/84     Pulse Rate 06/26/22 1224 82     Resp 06/26/22 1224 18     Temp 06/26/22 1224 98.8 F (37.1 C)     Temp Source 06/26/22 1224 Oral     SpO2 06/26/22 1224 98 %     Weight --      Height --      Head Circumference --      Peak Flow --      Pain Score 06/26/22 1225 8     Pain Loc --      Pain Edu? --      Excl. in [place]? --    No data found.  Updated Vital Signs BP (!) 143/84 (BP Location: Left Arm)   Pulse 82   Temp 98.8 F (37.1 C) (Oral)   Resp 18   LMP 05/29/2022 Comment: requesting a pregnancy test.  SpO2 98%   Visual Acuity Right Eye Distance:   Left Eye Distance:   Bilateral Distance:    Right Eye Near:   Left Eye Near:    Bilateral Near:     Physical Exam Constitutional:      Appearance: Normal appearance.  HENT:     Right Ear: Tympanic membrane normal.     Left Ear: Tympanic membrane normal.     Nose: Congestion and rhinorrhea present.     Mouth/Throat:     Mouth: Mucous membranes are moist.     Pharynx: No oropharyngeal exudate.  Cardiovascular:     Rate and Rhythm: Normal rate and regular rhythm.  Pulmonary:     Effort:  Pulmonary effort is normal.  Musculoskeletal:     Cervical back: Normal range of motion and neck supple. No tenderness.  Skin:    General:  Skin is warm.  Neurological:     General: No focal deficit present.     Mental Status: She is alert.  Psychiatric:        Mood and Affect: Mood normal.      UC Treatments / Results  Labs (all labs ordered are listed, but only abnormal results are displayed) Labs Reviewed  SARS CORONAVIRUS 2 (TAT 6-24 HRS)  CULTURE, GROUP A STREP (Tool)  POC URINE PREG, ED  POC INFLUENZA A AND B ANTIGEN (URGENT CARE ONLY)  POC URINE PREG, ED  POCT RAPID STREP A, ED / UC    EKG   Radiology No results found.  Procedures Procedures (including critical care time)  Medications Ordered in UC Medications - No data to display  Initial Impression / Assessment and Plan / UC Course  I have reviewed the triage vital signs and the nursing notes.  Pertinent labs & imaging results that were available during my care of the patient were reviewed by me and considered in my medical decision making (see chart for details).   Final Clinical Impressions(s) / UC Diagnoses   Final diagnoses:  Viral URI with cough  Encounter for screening for COVID-19     Discharge Instructions      You were seen today for upper respiratory symptoms.  Your strep test was negative and will be sent for culture.  Your flu swab was negative.   Your covid swab will be resulted tomorrow.  You will be notified of any positive results needing treatment.  I recommend tyelnol and motrin to help with body aches and fever.  You may trial over the counter mucinex to help with cough.  This could just be another virus causing symptoms.  Please return if you are having worsening symptoms despite medication use.     ED Prescriptions   None    PDMP not reviewed this encounter.   Rondel Oh, MD 06/26/22 1323    Rondel Oh, MD 07/02/22 1030

## 2022-06-26 NOTE — ED Triage Notes (Addendum)
Pt c/o cough, fever, sore throat, and nasal congestion since yesterday. States has the [place] and Flu going around in her clinic. Denies taking any meds.  Pt requesting a preg test.

## 2022-06-27 LAB — SARS CORONAVIRUS 2 (TAT 6-24 HRS): SARS Coronavirus 2: POSITIVE — AB

## 2022-06-29 LAB — CULTURE, GROUP A STREP (THRC)

## 2022-08-01 ENCOUNTER — Encounter (HOSPITAL_COMMUNITY): Payer: Self-pay

## 2022-08-01 ENCOUNTER — Ambulatory Visit (HOSPITAL_COMMUNITY)
Admission: RE | Admit: 2022-08-01 | Discharge: 2022-08-01 | Disposition: A | Discharge status: Home / Self Care | Payer: Medicaid Other | Source: Ambulatory Visit | Attending: Internal Medicine | Admitting: Internal Medicine

## 2022-08-01 VITALS — BP 129/83 | HR 80 | Temp 98.2°F | Resp 16

## 2022-08-01 DIAGNOSIS — J028 Acute pharyngitis due to other specified organisms: Secondary | ICD-10-CM | POA: Insufficient documentation

## 2022-08-01 DIAGNOSIS — B9689 Other specified bacterial agents as the cause of diseases classified elsewhere: Secondary | ICD-10-CM | POA: Diagnosis not present

## 2022-08-01 DIAGNOSIS — Z1152 Encounter for screening for COVID-19: Secondary | ICD-10-CM | POA: Diagnosis not present

## 2022-08-01 LAB — POCT RAPID STREP A, ED / UC: Streptococcus, Group A Screen (Direct): NEGATIVE

## 2022-08-01 NOTE — Discharge Instructions (Addendum)
Strep test was negative. Will call if COVID is positive

## 2022-08-01 NOTE — ED Provider Notes (Signed)
Samantha Bowers   [id number] 08/01/22 Arrival Time: [id number]  ASSESSMENT & PLAN:  1. Pharyngitis due to other organism    History and exam is consistent with pharyngitis.  Point-of-care strep testing is negative.  COVID test is pending.  I recommended symptomatic treatment with over-the-counter cough cold medicine such as Mucinex, Robitussin as needed for symptomatic relief.  Will call if COVID test is positive.  All questions answered and she agrees to plan.  Work note given.  No orders of the defined types were placed in this encounter.    Discharge Instructions      Strep test was negative. Will call if COVID is positive        Reviewed expectations re: course of current medical issues. Questions answered. Outlined signs and symptoms indicating need for more acute intervention. Patient verbalized understanding. After Visit Summary given.   SUBJECTIVE: Pleasant 27 year old female comes to clinic to be evaluated for sore throat and cough and nasal congestion.  Symptoms started about 4 days ago.  Throat hurts to swallow.  Cough is nonproductive. Her daughter has similar symptoms.  The patient had a history of recent strep infection couple months ago and completed multiple rounds of antibiotics for this.   The patient has history of seasonal allergies as well.  She just wants to make sure she does not have recurrent strep throat.  She had a negative at-home COVID test yesterday.  No fevers, chills, chest pain, shortness of breath.  Patient's last menstrual period was 07/17/2022. Past Surgical History:  Procedure Laterality Date   NO PAST SURGERIES       OBJECTIVE:  Vitals:   08/01/22 1147  BP: 129/83  Pulse: 80  Resp: 16  Temp: 98.2 F (36.8 C)  TempSrc: Oral  SpO2: 97%     Physical Exam Vitals and nursing note reviewed.  Constitutional:      General: She is not in acute distress. HENT:     Head: Normocephalic.     Right Ear: Tympanic membrane normal.  Tympanic membrane is not erythematous.     Left Ear: Tympanic membrane normal. Tympanic membrane is not erythematous.     Nose: Congestion present.     Mouth/Throat:     Pharynx: Posterior oropharyngeal erythema present. No oropharyngeal exudate.     Tonsils: No tonsillar exudate.  Cardiovascular:     Rate and Rhythm: Normal rate.  Pulmonary:     Effort: Pulmonary effort is normal. No respiratory distress.     Breath sounds: Normal breath sounds.  Musculoskeletal:     Cervical back: Normal range of motion and neck supple.  Lymphadenopathy:     Cervical: No cervical adenopathy.  Skin:    General: Skin is warm.  Neurological:     General: No focal deficit present.     Mental Status: She is alert.  Psychiatric:        Mood and Affect: Mood normal.      Labs: Results for orders placed or performed during the hospital encounter of 08/01/22  POCT Rapid Strep A  Result Value Ref Range   Streptococcus, Group A Screen (Direct) NEGATIVE NEGATIVE   Labs Reviewed  SARS CORONAVIRUS 2 (TAT 6-24 HRS)  CULTURE, GROUP A STREP [facility])  POCT RAPID STREP A, ED / UC    Imaging: No results found.   No Known Allergies  Past Medical History:  Diagnosis Date   Depression    Diabetes mellitus without complication ([place])    gestational    Social History   Socioeconomic History   Marital status: Single    Spouse name: Not on file   Number of children: Not on file   Years of education: Not on file   Highest education level: Not on file  Occupational History   Not on file  Tobacco Use   Smoking status: Never   Smokeless tobacco: Never  Vaping Use   Vaping Use: Never used  Substance and Sexual Activity   Alcohol use: Yes    Comment: occ   Drug use: No   Sexual activity: Yes    Birth control/protection: None  Other Topics Concern   Not on file  Social History Narrative   Not on file   Social Determinants of Health   Financial  Resource Strain: Not on file  Food Insecurity: Not on file  Transportation Needs: No Transportation Needs (04/26/2018)   PRAPARE - Hydrologist (Medical): No    Lack of Transportation (Non-Medical): No  Physical Activity: Not on file  Stress: Stress Concern Present (04/26/2018)   [place]    Feeling of Stress : To some extent  Social Connections: Not on file  Intimate Partner Violence: Not At Risk (04/26/2018)   Humiliation, Afraid, Rape, and Kick questionnaire    Fear of Current or Ex-Partner: No    Emotionally Abused: No    Physically Abused: No    Sexually Abused: No    Family History  Problem Relation Age of Onset   Diabetes Mother    Diabetes Father       Savera Donson, Dorian Pod, MD 08/01/22 1246

## 2022-08-01 NOTE — ED Triage Notes (Addendum)
Pt c/o sore throat and cough for 4 days. Reports has recurrent strep. Hasn't taken any medications for symptoms. Had negative home covid test.

## 2022-08-02 LAB — SARS CORONAVIRUS 2 (TAT 6-24 HRS): SARS Coronavirus 2: NEGATIVE

## 2022-08-04 LAB — CULTURE, GROUP A STREP (THRC)

## 2022-10-09 ENCOUNTER — Ambulatory Visit (HOSPITAL_COMMUNITY): Payer: Self-pay

## 2022-10-14 ENCOUNTER — Other Ambulatory Visit: Payer: Self-pay

## 2022-10-14 ENCOUNTER — Encounter (HOSPITAL_COMMUNITY): Payer: Self-pay

## 2022-10-14 ENCOUNTER — Ambulatory Visit (HOSPITAL_COMMUNITY)
Admission: RE | Admit: 2022-10-14 | Discharge: 2022-10-14 | Disposition: A | Discharge status: Home / Self Care | Payer: Medicaid Other | Source: Ambulatory Visit | Attending: Emergency Medicine | Admitting: Emergency Medicine

## 2022-10-14 VITALS — BP 104/67 | HR 80 | Temp 98.2°F | Resp 20

## 2022-10-14 DIAGNOSIS — R051 Acute cough: Secondary | ICD-10-CM | POA: Insufficient documentation

## 2022-10-14 DIAGNOSIS — J029 Acute pharyngitis, unspecified: Secondary | ICD-10-CM | POA: Diagnosis present

## 2022-10-14 DIAGNOSIS — Z20822 Contact with and (suspected) exposure to covid-19: Secondary | ICD-10-CM | POA: Insufficient documentation

## 2022-10-14 DIAGNOSIS — Z113 Encounter for screening for infections with a predominantly sexual mode of transmission: Secondary | ICD-10-CM | POA: Insufficient documentation

## 2022-10-14 DIAGNOSIS — N898 Other specified noninflammatory disorders of vagina: Secondary | ICD-10-CM | POA: Diagnosis not present

## 2022-10-14 LAB — POCT URINALYSIS DIP (MANUAL ENTRY)
Bilirubin, UA: NEGATIVE
Blood, UA: NEGATIVE
Glucose, UA: >=1000 mg/dL — AB
Ketones, POC UA: NEGATIVE mg/dL
Leukocytes, UA: NEGATIVE
Nitrite, UA: NEGATIVE
Protein Ur, POC: NEGATIVE mg/dL
Spec Grav, UA: 1.025 (ref 1.010–1.025)
Urobilinogen, UA: 0.2 E.U./dL
pH, UA: 6 (ref 5.0–8.0)

## 2022-10-14 LAB — POCT RAPID STREP A (OFFICE): Rapid Strep A Screen: NEGATIVE

## 2022-10-14 LAB — HIV ANTIBODY (ROUTINE TESTING W REFLEX): HIV Screen 4th Generation wRfx: NONREACTIVE

## 2022-10-14 LAB — POCT URINE PREGNANCY: Preg Test, Ur: NEGATIVE

## 2022-10-14 MED ORDER — CETIRIZINE HCL 10 MG PO TABS: 10.0000 mg | ORAL_TABLET | Freq: Every day | ORAL | 2 refills | Status: AC

## 2022-10-14 NOTE — ED Triage Notes (Signed)
Sore throat for 2 days, intermittently, reports phlegm and blood ting intermittently.. reports strep going around at work, has a cough as well.    Requesting routine std testing, pregnancy, covid and strep   Patient reports itching and vaginal discharge for the last 4 days.    Patient has not used any medications  Patient reports no pcp

## 2022-10-14 NOTE — ED Provider Notes (Signed)
[facility]    CSN: [id number] Arrival date & time: 10/14/22  1205      History   Chief Complaint Chief Complaint  Patient presents with   Sore Throat   SEXUALLY TRANSMITTED DISEASE   Cough    HPI Samantha Bowers is a 27 y.o. female.  2-day history of intermittent sore throat and cough.  Reports 5/10 pain with swallowing.  At work exposure to COVID and strep, requesting testing for both. She took Tylenol once that relieved this throat pain. No fever or chills.  No abdominal pain, NVD, rash  Also requesting STD testing and pregnancy test. Some vaginal itching and discharge x 4 days. No known exposures. 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   Depression 11/05/2017   Mastitis during puerperium 11/05/2017   SVD (spontaneous vaginal delivery) 10/12/2017   Pre-existing type 2 diabetes mellitus in pregnancy 10/10/2017   GBS (group B streptococcus) UTI complicating pregnancy 07/12/2017   Pre-existing type 2 diabetes mellitus with hyperglycemia during pregnancy in third trimester (HCC) 07/12/2017   Chlamydia infection complicating pregnancy 07/12/2017   Supervision of high risk pregnancy, antepartum 07/12/2017   Poor glycemic control 07/12/2017    Past Surgical History:  Procedure Laterality Date   NO PAST SURGERIES      OB History     Gravida  1   Para  1   Term  1   Preterm  0   AB  0   Living  1      SAB  0   IAB  0   Ectopic  0   Multiple  0   Live Births  1            Home Medications    Prior to Admission medications   Medication Sig Start Date End Date Taking? Authorizing Provider  cetirizine (ZYRTEC  ALLERGY) 10 MG tablet Take 1 tablet (10 mg total) by mouth daily. 10/14/22  Yes Sherrice Creekmore, PA-C  blood glucose meter kit and supplies KIT Dispense based on patient and insurance preference. Use up to four times daily as directed. (FOR ICD-9 250.00, 250.01). 04/28/18   McNew, Ileene Hutchinson, MD  glucose blood test strip Use as instructed 10/01/17   Pincus Large, DO  Insulin Syringe-Needle U-100 (SAFETY INSULIN SYRINGES) 30G X 5/16" 0.5 ML MISC Use with insulin BID with meals 04/28/18   McNew, Ileene Hutchinson, MD  levETIRAcetam (KEPPRA) 500 MG tablet Take 500 mg by mouth 2 (two) times daily. 05/03/22   [provider]    Family History Family History  Problem Relation Age of Onset   Diabetes Mother    Diabetes Father     Social History Social History   Tobacco Use   Smoking status: Never   Smokeless tobacco: Never  Vaping Use   Vaping Use: Never used  Substance Use Topics   Alcohol use: Yes    Comment: occ   Drug use: No     Allergies   Patient has no known allergies.   Review of Systems Review of Systems As per HPI  Physical Exam Triage Vital Signs ED Triage Vitals  Enc Vitals Group  BP 10/14/22 1319 104/67     Pulse Rate 10/14/22 1319 80     Resp 10/14/22 1319 20     Temp 10/14/22 1319 98.2 F (36.8 C)     Temp Source 10/14/22 1319 Oral     SpO2 10/14/22 1319 98 %     Weight --      Height --      Head Circumference --      Peak Flow --      Pain Score 10/14/22 1315 5     Pain Loc --      Pain Edu? --      Excl. in GC? --    No data found.  Updated Vital Signs BP 104/67 (BP Location: Left Arm) Comment (BP Location): large cuff  Pulse 80   Temp 98.2 F (36.8 C) (Oral)   Resp 20   LMP 09/23/2022 (Exact Date)   SpO2 98%    Physical Exam Vitals and nursing note reviewed.  Constitutional:      General: She is not in acute distress.    Appearance: Normal appearance. She is not ill-appearing.  HENT:     Nose: No rhinorrhea.     Mouth/Throat:      Mouth: Mucous membranes are moist.     Pharynx: Oropharynx is clear. No posterior oropharyngeal erythema.  Eyes:     Conjunctiva/sclera: Conjunctivae normal.  Cardiovascular:     Rate and Rhythm: Normal rate and regular rhythm.     Pulses: Normal pulses.     Heart sounds: Normal heart sounds.  Pulmonary:     Effort: Pulmonary effort is normal.     Breath sounds: Normal breath sounds.  Abdominal:     Palpations: Abdomen is soft.     Tenderness: There is no abdominal tenderness.  Musculoskeletal:     Cervical back: Normal range of motion.  Lymphadenopathy:     Cervical: No cervical adenopathy.  Skin:    General: Skin is warm and dry.  Neurological:     Mental Status: She is alert and oriented to person, place, and time.     UC Treatments / Results  Labs (all labs ordered are listed, but only abnormal results are displayed) Labs Reviewed  POCT URINALYSIS DIP (MANUAL ENTRY) - Abnormal; Notable for the following components:      Result Value   Glucose, UA >=1,000 (*)    All other components within normal limits  SARS CORONAVIRUS 2 (TAT 6-24 HRS)  CULTURE, GROUP A STREP ([facility])  HIV ANTIBODY (ROUTINE TESTING W REFLEX)  RPR  POCT RAPID STREP A (OFFICE)  POCT URINE PREGNANCY  CERVICOVAGINAL ANCILLARY ONLY    EKG   Radiology No results found.  Procedures Procedures (including critical care time)  Medications Ordered in UC Medications - No data to display  Initial Impression / Assessment and Plan / UC Course  I have reviewed the triage vital signs and the nursing notes.  Pertinent labs & imaging results that were available during my care of the patient were reviewed by me and considered in my medical decision making (see chart for details).  UPT negative UA with glucose but no sign of infection. Rapid strep negative. Culture pending.  Covid test is pending per patient request Cytology swab pending with HIV/RPR per patient request  Recommend symptomatic care for  viral etiology of ST/cough.  Discussed over-the-counter medications to try.  Reassuring that Tylenol helped and she can continue this.  On exam her throat looks good and lungs  are clear.  Afebrile.  Will call with any positive results.  Work note provided. Advise she establish with primary care and follow-up with them  Final Clinical Impressions(s) / UC Diagnoses   Final diagnoses:  Viral pharyngitis  Acute cough  Screen for STD (sexually transmitted disease)  Vaginal discharge  Encounter for laboratory testing for COVID-19 virus     Discharge Instructions      STD testing: We will call you if anything on your swab or blood work returns positive. Please abstain from sexual intercourse until your results return.  We will call you if your covid test returns positive or if anything grows on throat culture requiring antibiotics.  This can take 2 to 3 days.  I recommend symptomatic care in the meantime with Tylenol, salt water gargles, throat lozenges, honey, over-the-counter cough syrup.  Try once daily zyrtec for itching, runny nose, congestion, etc  Please scan the QR code on the last page to set yourself up with a new primary care provider.  Follow-up with them regarding your concerns.     ED Prescriptions     Medication Sig Dispense Auth. Provider   cetirizine (ZYRTEC ALLERGY) 10 MG tablet Take 1 tablet (10 mg total) by mouth daily. 30 tablet Densil Ottey, Lurena Joiner, PA-C      PDMP not reviewed this encounter.   Marlow Baars, [place] 10/14/22 1423

## 2022-10-14 NOTE — ED Notes (Signed)
Reviewed work note 

## 2022-10-14 NOTE — Discharge Instructions (Addendum)
STD testing: We will call you if anything on your swab or blood work returns positive. Please abstain from sexual intercourse until your results return.  We will call you if your covid test returns positive or if anything grows on throat culture requiring antibiotics.  This can take 2 to 3 days.  I recommend symptomatic care in the meantime with Tylenol, salt water gargles, throat lozenges, honey, over-the-counter cough syrup.  Try once daily zyrtec for itching, runny nose, congestion, etc  Please scan the QR code on the last page to set yourself up with a new primary care provider.  Follow-up with them regarding your concerns.

## 2022-10-15 LAB — SARS CORONAVIRUS 2 (TAT 6-24 HRS): SARS Coronavirus 2: NEGATIVE

## 2022-10-15 LAB — RPR: RPR Ser Ql: NONREACTIVE

## 2022-10-16 LAB — CERVICOVAGINAL ANCILLARY ONLY
Bacterial Vaginitis (gardnerella): POSITIVE — AB
Candida Glabrata: NEGATIVE
Candida Vaginitis: NEGATIVE
Chlamydia: NEGATIVE
Comment: NEGATIVE
Comment: NEGATIVE
Comment: NEGATIVE
Comment: NEGATIVE
Comment: NEGATIVE
Comment: NORMAL
Neisseria Gonorrhea: NEGATIVE
Trichomonas: NEGATIVE

## 2022-10-17 ENCOUNTER — Telehealth (HOSPITAL_COMMUNITY): Payer: Self-pay | Admitting: Emergency Medicine

## 2022-10-17 LAB — CULTURE, GROUP A STREP (THRC)

## 2022-10-17 MED ORDER — METRONIDAZOLE 500 MG PO TABS: 500.0000 mg | ORAL_TABLET | Freq: Two times a day (BID) | ORAL | 0 refills | Status: DC

## 2022-10-17 NOTE — Telephone Encounter (Signed)
For positive BV, see result

## 2022-11-16 ENCOUNTER — Ambulatory Visit
Admission: EM | Admit: 2022-11-16 | Discharge: 2022-11-16 | Disposition: A | Discharge status: Home / Self Care | Payer: Medicaid Other | Attending: Urgent Care | Admitting: Urgent Care

## 2022-11-16 ENCOUNTER — Ambulatory Visit (HOSPITAL_COMMUNITY): Payer: Self-pay

## 2022-11-16 DIAGNOSIS — A6000 Herpesviral infection of urogenital system, unspecified: Secondary | ICD-10-CM | POA: Diagnosis not present

## 2022-11-16 DIAGNOSIS — N898 Other specified noninflammatory disorders of vagina: Secondary | ICD-10-CM

## 2022-11-16 MED ORDER — VALACYCLOVIR HCL 1 G PO TABS: ORAL_TABLET | ORAL | 1 refills | Status: AC

## 2022-11-16 NOTE — ED Provider Notes (Signed)
[facility]  Note:  This document was prepared using Conservation officer, historic buildings and may include unintentional dictation errors.  MRN: [id number] DOB: Oct 28, 1995  Subjective:   Samantha Bowers is a 27 y.o. female presenting for 2-day history of acute onset painful vaginal sores to the lower vaginal area going towards the anal area.  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  Has not had an outbreak in about 2 years now.  Would like to be evaluated to make sure this is not a different kind of infection.  Denies fever, n/v, abdominal pain, pelvic pain, rashes, dysuria, urinary frequency, hematuria.  She does note some vaginal discharge but is not sure if this is normal for her.  No current facility-administered medications for this encounter.  Current Outpatient Medications:    blood glucose meter kit and supplies KIT, Dispense based on patient and insurance preference. Use up to four times daily as directed. (FOR ICD-9 250.00, 250.01)., Disp: 1 each, Rfl: 0   cetirizine (ZYRTEC ALLERGY) 10 MG tablet, Take 1 tablet (10 mg total) by mouth daily., Disp: 30 tablet, Rfl: 2   glucose blood test strip, Use as instructed, Disp: 50 each, Rfl: 12   Insulin Syringe-Needle U-100 (SAFETY INSULIN SYRINGES) 30G X 5/16" 0.5 ML MISC, Use with insulin BID with meals, Disp: 60 each, Rfl: 0   levETIRAcetam (KEPPRA) 500 MG tablet, Take 500 mg by mouth 2 (two) times daily., Disp: , Rfl:    metroNIDAZOLE (FLAGYL) 500 MG tablet, Take 1 tablet (500 mg total) by mouth 2 (two) times daily., Disp: 14 tablet, Rfl: 0   No Known Allergies  Past Medical History:  Diagnosis Date   Depression    Diabetes mellitus without complication (HCC)    gestational     Past Surgical History:  Procedure Laterality Date   NO PAST SURGERIES      Family History  Problem Relation Age of Onset   Diabetes Mother    Diabetes Father     Social History   Tobacco Use    Smoking status: Never   Smokeless tobacco: Never  Vaping Use   Vaping Use: Never used  Substance Use Topics   Alcohol use: Yes    Comment: occ   Drug use: No    ROS   Objective:   Vitals: BP 117/74 (BP Location: Right Arm)   Pulse 73   Temp 97.9 F (36.6 C) (Oral)   Resp 18   LMP 10/19/2022   SpO2 98%   Physical Exam Constitutional:      General: She is not in acute distress.    Appearance: Normal appearance. She is well-developed. She is not ill-appearing, toxic-appearing or diaphoretic.  HENT:     Head: Normocephalic and atraumatic.     Nose: Nose normal.     Mouth/Throat:     Mouth: Mucous membranes are moist.  Eyes:     General: No scleral icterus.       Right eye: No discharge.        Left eye: No discharge.     Extraocular Movements: Extraocular movements intact.  Cardiovascular:     Rate and Rhythm: Normal rate.  Pulmonary:     Effort: Pulmonary effort is normal.  Genitourinary:   Skin:    General: Skin is warm and dry.  Neurological:     General: No focal deficit present.     Mental Status: She is alert and oriented to  person, place, and time.  Psychiatric:        Mood and Affect: Mood normal.        Behavior: Behavior normal.      Assessment and Plan :   PDMP not reviewed this encounter.  1. Genital herpes simplex, unspecified site   2. Vaginal discharge    Physical exam findings consistent with genital HSV outbreak.  Recommended valacyclovir, defer viral cytology as this is already a confirmed infection for her.  Vaginal cytology pending.  Will defer any other treatments pending results.  Counseled patient on potential for adverse effects with medications prescribed/recommended today, ER and return-to-clinic precautions discussed, patient verbalized understanding.    Wallis Bamberg, [place] 11/17/22 [id number]

## 2022-11-16 NOTE — ED Triage Notes (Addendum)
Pt c/o sores x 2 to vaginal area x 2 days-sores "popped up" after shaving-states she is unsure if it is cuts vs herpes outbreak-NAD-steady gait

## 2022-11-19 ENCOUNTER — Telehealth (HOSPITAL_COMMUNITY): Payer: Self-pay | Admitting: Emergency Medicine

## 2022-11-19 LAB — CERVICOVAGINAL ANCILLARY ONLY
Bacterial Vaginitis (gardnerella): POSITIVE — AB
Candida Glabrata: NEGATIVE
Candida Vaginitis: NEGATIVE
Chlamydia: NEGATIVE
Comment: NEGATIVE
Comment: NEGATIVE
Comment: NEGATIVE
Comment: NEGATIVE
Comment: NEGATIVE
Comment: NORMAL
Neisseria Gonorrhea: NEGATIVE
Trichomonas: NEGATIVE

## 2022-11-19 MED ORDER — METRONIDAZOLE 500 MG PO TABS: 500.0000 mg | ORAL_TABLET | Freq: Two times a day (BID) | ORAL | 0 refills | Status: AC

## 2022-12-03 ENCOUNTER — Ambulatory Visit: Payer: Medicaid Other | Admitting: Family Medicine

## 2022-12-13 ENCOUNTER — Encounter (HOSPITAL_COMMUNITY): Payer: Self-pay

## 2022-12-13 ENCOUNTER — Ambulatory Visit (HOSPITAL_COMMUNITY)
Admission: RE | Admit: 2022-12-13 | Discharge: 2022-12-13 | Disposition: A | Discharge status: Home / Self Care | Payer: Medicaid Other | Source: Ambulatory Visit

## 2022-12-13 ENCOUNTER — Ambulatory Visit (INDEPENDENT_AMBULATORY_CARE_PROVIDER_SITE_OTHER): Payer: Medicaid Other

## 2022-12-13 VITALS — BP 98/65 | HR 86 | Temp 98.6°F | Resp 16 | Ht 72.0 in | Wt 215.0 lb

## 2022-12-13 DIAGNOSIS — S93402A Sprain of unspecified ligament of left ankle, initial encounter: Secondary | ICD-10-CM

## 2022-12-13 HISTORY — DX: Epilepsy, unspecified, not intractable, without status epilepticus: G40.909

## 2022-12-13 NOTE — Discharge Instructions (Addendum)
The ankle x-ray today is negative for acute bony abnormality or fracture or broken bones.  Please wear the Ace wrap when you are up walking on your ankle.  When sitting down, elevate your ankle and apply ice 15 minutes on, 45 minutes off every hour while awake.  You can take Tylenol or ibuprofen as needed for pain.

## 2022-12-13 NOTE — ED Provider Notes (Signed)
[facility]    CSN: [id number] Arrival date & time: 12/13/22  1037      History   Chief Complaint Chief Complaint  Patient presents with   Ankle Pain   Appointment    HPI Samantha Bowers is a 27 y.o. female.   Patient presents today for 1 day history of left ankle pain.  Began after she tripped over a curb yesterday, does not really remember exactly what happened.  Reports after the initial injury, she was able to bear weight and she was able to walk around to see with her daughter.  Reports at this year, her ankle began hurting more and began swelling.  Reports this morning, she had numbness/tingling shooting down to her toes.  She took this Tylenol this morning which helped with pain.      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/26/2018   Depression 11/05/2017   Mastitis during puerperium 11/05/2017   SVD (spontaneous vaginal delivery) 10/12/2017   Pre-existing type 2 diabetes mellitus in pregnancy 10/10/2017   GBS (group B streptococcus) UTI complicating pregnancy 07/12/2017   Pre-existing type 2 diabetes mellitus with hyperglycemia during pregnancy in third trimester (HCC) 07/12/2017   Chlamydia infection complicating pregnancy 07/12/2017   Supervision of high risk pregnancy, antepartum 07/12/2017   Poor glycemic control 07/12/2017    Past Surgical History:  Procedure Laterality Date   NO PAST SURGERIES      OB History     Gravida  1   Para  1   Term  1   Preterm  0   AB  0   Living  1      SAB  0   IAB  0   Ectopic  0   Multiple  0   Live Births  1            Home Medications    Prior to Admission  medications   Medication Sig Start Date End Date Taking? Authorizing Provider  folic acid (FOLVITE) 1 MG tablet Take 1 tablet by mouth daily. 05/07/22 10/05/23 Yes [provider]  lamoTRIgine (LAMICTAL) 25 MG tablet Take 25 mg by mouth daily. 11/20/22  Yes [provider]  levETIRAcetam (KEPPRA) 500 MG tablet Take 500 mg by mouth 2 (two) times daily. 05/03/22  Yes [provider]  OZEMPIC, 0.25 OR 0.5 MG/DOSE, 2 MG/3ML SOPN Inject into the skin.   Yes [provider]  blood glucose meter kit and supplies KIT Dispense based on patient and insurance preference. Use up to four times daily as directed. (FOR ICD-9 250.00, 250.01). 04/28/18   McNew, Ileene Hutchinson, MD  cetirizine (ZYRTEC ALLERGY) 10 MG tablet Take 1 tablet (10 mg total) by mouth daily. 10/14/22   Rising, Lurena Joiner, PA-C  glucose blood test strip Use as instructed 10/01/17   Pincus Large, DO  Insulin Syringe-Needle U-100 (SAFETY INSULIN SYRINGES) 30G X 5/16" 0.5 ML MISC Use with insulin BID with meals 04/28/18   McNew, Ileene Hutchinson, MD  metroNIDAZOLE (FLAGYL) 500 MG tablet Take 1 tablet (500 mg total) by mouth 2 (two) times daily. 11/19/22   Lamptey, Britta Mccreedy, MD  valACYclovir Ralph Dowdy)  1000 MG tablet At the start of an outbreak take 1 tablet daily for 5 days. 11/16/22   Wallis Bamberg, PA-C    Family History Family History  Problem Relation Age of Onset   Diabetes Mother    Diabetes Father     Social History Social History   Tobacco Use   Smoking status: Never   Smokeless tobacco: Never  Vaping Use   Vaping status: Never Used  Substance Use Topics   Alcohol use: Yes    Comment: occ   Drug use: No     Allergies   Patient has no known allergies.   Review of Systems Review of Systems Per HPI  Physical Exam Triage Vital Signs ED Triage Vitals  Encounter Vitals Group     BP 12/13/22 1108 98/65     Systolic BP Percentile --      Diastolic BP Percentile --      Pulse Rate 12/13/22 1108 86     Resp  12/13/22 1108 16     Temp 12/13/22 1108 98.6 F (37 C)     Temp Source 12/13/22 1108 Oral     SpO2 12/13/22 1108 98 %     Weight 12/13/22 1107 215 lb (97.5 kg)     Height 12/13/22 1107 6' (1.829 m)     Head Circumference --      Peak Flow --      Pain Score 12/13/22 1104 10     Pain Loc --      Pain Education --      Exclude from Growth Chart --    No data found.  Updated Vital Signs BP 98/65 (BP Location: Left Arm)   Pulse 86   Temp 98.6 F (37 C) (Oral)   Resp 16   Ht 6' (1.829 m)   Wt 215 lb (97.5 kg)   LMP 11/22/2022 (Exact Date)   SpO2 98%   BMI 29.16 kg/m   Visual Acuity Right Eye Distance:   Left Eye Distance:   Bilateral Distance:    Right Eye Near:   Left Eye Near:    Bilateral Near:     Physical Exam Vitals and nursing note reviewed.  Constitutional:      General: She is not in acute distress.    Appearance: Normal appearance. She is not toxic-appearing.  HENT:     Mouth/Throat:     Mouth: Mucous membranes are moist.     Pharynx: Oropharynx is clear.  Pulmonary:     Effort: Pulmonary effort is normal. No respiratory distress.  Musculoskeletal:     Comments: Inspection: mild swelling to proximal carpal bones, bruising, obvious deformity or redness to left ankle or foot Palpation: tender to palpation to left lateral malleolus diffusely and proximal carpal bones; no obvious deformities palpated ROM: Full ROM to left ankle and flexibility of foot Strength: 5/5 bilateral lower extremities Neurovascular: neurovascularly intact in bilateral lower extremities   Skin:    General: Skin is warm and dry.     Capillary Refill: Capillary refill takes less than 2 seconds.     Coloration: Skin is not jaundiced or pale.     Findings: No erythema.  Neurological:     Mental Status: She is alert and oriented to person, place, and time.  Psychiatric:        Behavior: Behavior is cooperative.      UC Treatments / Results  Labs (all labs ordered are listed,  but only abnormal results are displayed) Labs Reviewed -  No data to display  EKG   Radiology DG Ankle Complete Left  Result Date: 12/13/2022 CLINICAL DATA:  Fall, pain EXAM: LEFT ANKLE COMPLETE - 3+ VIEW COMPARISON:  None Available. FINDINGS: There is no evidence of fracture, dislocation, or joint effusion. There is no evidence of arthropathy or other focal bone abnormality. Soft tissues are unremarkable. IMPRESSION: No fracture or dislocation of the left ankle. Joint spaces are preserved. Electronically Signed   By: Jearld Lesch M.D.   On: 12/13/2022 11:41    Procedures Procedures (including critical care time)  Medications Ordered in UC Medications - No data to display  Initial Impression / Assessment and Plan / UC Course  I have reviewed the triage vital signs and the nursing notes.  Pertinent labs & imaging results that were available during my care of the patient were reviewed by me and considered in my medical decision making (see chart for details).   Patient is well-appearing, normotensive, afebrile, not tachycardic, not tachypneic, oxygenating well on room air.    1. Sprain of left ankle, unspecified ligament, initial encounter X-ray imaging today is negative Discussed rest, ice, compression, elevation Tylenol/ibuprofen as needed for pain Ace wrap provided today Start ankle rehab exercises and handout given  The patient was given the opportunity to ask questions.  All questions answered to their satisfaction.  The patient is in agreement to this plan.    Final Clinical Impressions(s) / UC Diagnoses   Final diagnoses:  Sprain of left ankle, unspecified ligament, initial encounter     Discharge Instructions      The ankle x-ray today is negative for acute bony abnormality or fracture or broken bones.  Please wear the Ace wrap when you are up walking on your ankle.  When sitting down, elevate your ankle and apply ice 15 minutes on, 45 minutes off every hour while  awake.  You can take Tylenol or ibuprofen as needed for pain.    ED Prescriptions   None    PDMP not reviewed this encounter.   Valentino Nose, NP 12/13/22 1705

## 2022-12-13 NOTE — ED Triage Notes (Signed)
"  I fell yesterday and foot is swollen and hard to walk on - Entered by patient"  Left ankle pain and swelling after fall yesterday, Patient tripped on a curb. [place] Having aching pain in the ankle with toe numbness.

## 2023-02-18 ENCOUNTER — Ambulatory Visit: Payer: Medicaid Other

## 2024-03-24 ENCOUNTER — Other Ambulatory Visit: Payer: Self-pay

## 2024-03-24 ENCOUNTER — Other Ambulatory Visit (HOSPITAL_COMMUNITY): Payer: Self-pay

## 2024-03-24 MED ORDER — EMPAGLIFLOZIN 25 MG PO TABS
25.0000 mg | ORAL_TABLET | Freq: Every day | ORAL | 1 refills | Status: AC
  Filled 2024-03-25 – 2024-06-02 (×2): qty 30, 30d supply, fill #0
  Filled ????-??-??: fill #0

## 2024-03-24 MED ORDER — LAMOTRIGINE 100 MG PO TABS
200.0000 mg | ORAL_TABLET | Freq: Two times a day (BID) | ORAL | 3 refills | Status: AC
  Filled 2024-03-24 – 2024-03-25 (×3): qty 360, 90d supply, fill #0
  Filled 2024-04-06: qty 120, 30d supply, fill #0
  Filled 2024-04-30: qty 120, 30d supply, fill #1
  Filled 2024-06-01: qty 120, 30d supply, fill #2
  Filled 2024-07-02: qty 120, 30d supply, fill #3

## 2024-03-24 MED ORDER — GLUCOSE BLOOD VI STRP
ORAL_STRIP | 2 refills | Status: AC
  Filled 2024-06-18: qty 100, 50d supply, fill #0

## 2024-03-24 MED ORDER — LAMOTRIGINE 100 MG PO TABS: 150.0000 mg | ORAL_TABLET | Freq: Two times a day (BID) | ORAL | 1 refills | Status: DC

## 2024-03-24 MED ORDER — LEVETIRACETAM ER 500 MG PO TB24
2000.0000 mg | ORAL_TABLET | Freq: Every day | ORAL | 1 refills | Status: AC
  Filled 2024-03-25 (×2): qty 360, 90d supply, fill #0
  Filled 2024-04-30: qty 120, 30d supply, fill #0
  Filled 2024-06-01: qty 120, 30d supply, fill #1
  Filled 2024-07-02: qty 120, 30d supply, fill #2

## 2024-03-24 MED ORDER — PIOGLITAZONE HCL 15 MG PO TABS
15.0000 mg | ORAL_TABLET | Freq: Every day | ORAL | 1 refills | Status: AC
  Filled 2024-03-25 – 2024-06-02 (×3): qty 30, 30d supply, fill #0
  Filled 2024-07-02: qty 30, 30d supply, fill #1

## 2024-03-25 ENCOUNTER — Other Ambulatory Visit (HOSPITAL_COMMUNITY): Payer: Self-pay

## 2024-03-25 ENCOUNTER — Encounter: Payer: Self-pay | Admitting: Pharmacist

## 2024-03-25 ENCOUNTER — Other Ambulatory Visit: Payer: Self-pay

## 2024-03-25 ENCOUNTER — Telehealth (HOSPITAL_COMMUNITY): Payer: Self-pay

## 2024-03-25 MED ORDER — PIOGLITAZONE HCL 15 MG PO TABS
15.0000 mg | ORAL_TABLET | Freq: Every day | ORAL | 1 refills | Status: AC
  Filled 2024-03-25 – 2024-04-06 (×2): qty 30, 30d supply, fill #0
  Filled 2024-04-30: qty 30, 30d supply, fill #1

## 2024-03-25 MED ORDER — LOSARTAN POTASSIUM 25 MG PO TABS
ORAL_TABLET | ORAL | 0 refills | Status: DC
  Filled 2024-03-25: qty 30, 60d supply, fill #0
  Filled 2024-04-06: qty 15, 30d supply, fill #0
  Filled 2024-04-30: qty 15, 30d supply, fill #1

## 2024-03-25 MED ORDER — PYRIDOXINE HCL 100 MG PO TABS
100.0000 mg | ORAL_TABLET | Freq: Every day | ORAL | 3 refills | Status: AC
  Filled 2024-03-25: qty 90, 90d supply, fill #0
  Filled 2024-04-06: qty 30, 30d supply, fill #0
  Filled 2024-04-30: qty 30, 30d supply, fill #1
  Filled 2024-06-01: qty 30, 30d supply, fill #2
  Filled 2024-07-02: qty 30, 30d supply, fill #3

## 2024-03-25 MED ORDER — PHENTERMINE HCL 37.5 MG PO TABS
18.7500 mg | ORAL_TABLET | Freq: Every day | ORAL | 0 refills | Status: DC
  Filled 2024-03-25 – 2024-03-26 (×2): qty 15, 30d supply, fill #0

## 2024-03-25 MED ORDER — VALACYCLOVIR HCL 1 G PO TABS
1000.0000 mg | ORAL_TABLET | Freq: Every day | ORAL | 0 refills | Status: AC | PRN
  Filled 2024-03-25: qty 3, 3d supply, fill #0

## 2024-03-25 MED ORDER — FOLIC ACID 1 MG PO TABS
1.0000 mg | ORAL_TABLET | Freq: Every day | ORAL | 11 refills | Status: AC
  Filled 2024-03-25 – 2024-04-06 (×3): qty 30, 30d supply, fill #0
  Filled 2024-04-30: qty 30, 30d supply, fill #1
  Filled 2024-06-01: qty 30, 30d supply, fill #2
  Filled 2024-07-02: qty 30, 30d supply, fill #3

## 2024-03-26 ENCOUNTER — Other Ambulatory Visit (HOSPITAL_COMMUNITY): Payer: Self-pay

## 2024-03-26 MED ORDER — MOUNJARO 7.5 MG/0.5ML ~~LOC~~ SOAJ
7.5000 mg | SUBCUTANEOUS | 1 refills | Status: DC
  Filled 2024-03-26 – 2024-04-02 (×2): qty 2, 28d supply, fill #0
  Filled 2024-05-22 – 2024-05-25 (×2): qty 2, 28d supply, fill #1

## 2024-03-27 ENCOUNTER — Other Ambulatory Visit: Payer: Self-pay

## 2024-03-30 ENCOUNTER — Other Ambulatory Visit: Payer: Self-pay

## 2024-03-30 ENCOUNTER — Other Ambulatory Visit (HOSPITAL_COMMUNITY): Payer: Self-pay

## 2024-03-30 MED ORDER — JARDIANCE 25 MG PO TABS
25.0000 mg | ORAL_TABLET | Freq: Every day | ORAL | 2 refills | Status: AC
  Filled 2024-03-30: qty 30, 30d supply, fill #0

## 2024-03-30 MED ORDER — SIMVASTATIN 10 MG PO TABS
10.0000 mg | ORAL_TABLET | Freq: Every day | ORAL | 1 refills | Status: AC
  Filled 2024-03-30 (×2): qty 90, 90d supply, fill #0
  Filled 2024-04-06 (×2): qty 30, 30d supply, fill #0
  Filled 2024-04-30: qty 30, 30d supply, fill #1
  Filled 2024-06-01: qty 30, 30d supply, fill #2
  Filled 2024-07-02: qty 30, 30d supply, fill #3

## 2024-03-31 ENCOUNTER — Other Ambulatory Visit: Payer: Self-pay

## 2024-03-31 ENCOUNTER — Other Ambulatory Visit (HOSPITAL_BASED_OUTPATIENT_CLINIC_OR_DEPARTMENT_OTHER): Payer: Self-pay

## 2024-04-01 ENCOUNTER — Other Ambulatory Visit: Payer: Self-pay

## 2024-04-02 ENCOUNTER — Other Ambulatory Visit (HOSPITAL_COMMUNITY): Payer: Self-pay

## 2024-04-02 ENCOUNTER — Other Ambulatory Visit: Payer: Self-pay

## 2024-04-03 ENCOUNTER — Other Ambulatory Visit: Payer: Self-pay

## 2024-04-03 ENCOUNTER — Other Ambulatory Visit (HOSPITAL_BASED_OUTPATIENT_CLINIC_OR_DEPARTMENT_OTHER): Payer: Self-pay

## 2024-04-03 ENCOUNTER — Encounter: Payer: Self-pay | Admitting: Pharmacist

## 2024-04-03 ENCOUNTER — Other Ambulatory Visit (HOSPITAL_COMMUNITY): Payer: Self-pay

## 2024-04-03 MED ORDER — VALACYCLOVIR HCL 1 G PO TABS
1000.0000 mg | ORAL_TABLET | Freq: Every day | ORAL | 0 refills | Status: AC | PRN
  Filled 2024-04-03 (×2): qty 3, 3d supply, fill #0

## 2024-04-04 ENCOUNTER — Other Ambulatory Visit (HOSPITAL_COMMUNITY): Payer: Self-pay

## 2024-04-06 ENCOUNTER — Other Ambulatory Visit: Payer: Self-pay

## 2024-04-06 ENCOUNTER — Encounter: Payer: Self-pay | Admitting: Pharmacist

## 2024-04-06 ENCOUNTER — Other Ambulatory Visit (HOSPITAL_COMMUNITY): Payer: Self-pay

## 2024-04-07 ENCOUNTER — Other Ambulatory Visit: Payer: Self-pay

## 2024-04-08 ENCOUNTER — Other Ambulatory Visit (HOSPITAL_COMMUNITY): Payer: Self-pay

## 2024-04-08 ENCOUNTER — Other Ambulatory Visit: Payer: Self-pay

## 2024-04-09 ENCOUNTER — Other Ambulatory Visit (HOSPITAL_COMMUNITY): Payer: Self-pay

## 2024-04-09 ENCOUNTER — Other Ambulatory Visit: Payer: Self-pay

## 2024-04-13 ENCOUNTER — Other Ambulatory Visit: Payer: Self-pay

## 2024-04-14 ENCOUNTER — Other Ambulatory Visit: Payer: Self-pay

## 2024-04-14 ENCOUNTER — Other Ambulatory Visit (HOSPITAL_COMMUNITY): Payer: Self-pay

## 2024-04-14 MED ORDER — METRONIDAZOLE 500 MG PO TABS
ORAL_TABLET | ORAL | 0 refills | Status: DC
  Filled 2024-04-14: qty 14, 7d supply, fill #0

## 2024-04-15 ENCOUNTER — Other Ambulatory Visit: Payer: Self-pay

## 2024-04-21 ENCOUNTER — Other Ambulatory Visit: Payer: Self-pay

## 2024-04-22 ENCOUNTER — Other Ambulatory Visit: Payer: Self-pay

## 2024-04-27 ENCOUNTER — Other Ambulatory Visit: Payer: Self-pay

## 2024-04-27 ENCOUNTER — Other Ambulatory Visit (HOSPITAL_COMMUNITY): Payer: Self-pay

## 2024-04-27 MED ORDER — SERTRALINE HCL 50 MG PO TABS
50.0000 mg | ORAL_TABLET | Freq: Every day | ORAL | 1 refills | Status: DC
  Filled 2024-04-27: qty 90, 90d supply, fill #0
  Filled 2024-04-30: qty 30, 30d supply, fill #0
  Filled 2024-06-02: qty 30, 30d supply, fill #1

## 2024-04-27 MED ORDER — JARDIANCE 25 MG PO TABS
25.0000 mg | ORAL_TABLET | Freq: Every day | ORAL | 1 refills | Status: AC
  Filled 2024-04-27: qty 90, 90d supply, fill #0

## 2024-04-28 ENCOUNTER — Other Ambulatory Visit: Payer: Self-pay

## 2024-04-28 ENCOUNTER — Other Ambulatory Visit (HOSPITAL_COMMUNITY): Payer: Self-pay

## 2024-04-28 MED ORDER — VALACYCLOVIR HCL 500 MG PO TABS
500.0000 mg | ORAL_TABLET | Freq: Every day | ORAL | 1 refills | Status: AC
  Filled 2024-04-28 – 2024-04-30 (×2): qty 30, 30d supply, fill #0
  Filled 2024-06-01: qty 30, 30d supply, fill #1
  Filled 2024-07-02: qty 30, 30d supply, fill #2

## 2024-04-28 MED ORDER — JARDIANCE 25 MG PO TABS
25.0000 mg | ORAL_TABLET | Freq: Every day | ORAL | 1 refills | Status: AC
  Filled 2024-04-28: qty 90, 90d supply, fill #0

## 2024-04-30 ENCOUNTER — Other Ambulatory Visit (HOSPITAL_COMMUNITY): Payer: Self-pay

## 2024-04-30 ENCOUNTER — Other Ambulatory Visit: Payer: Self-pay

## 2024-05-01 ENCOUNTER — Other Ambulatory Visit: Payer: Self-pay

## 2024-05-04 ENCOUNTER — Other Ambulatory Visit: Payer: Self-pay

## 2024-05-04 ENCOUNTER — Other Ambulatory Visit (HOSPITAL_COMMUNITY): Payer: Self-pay

## 2024-05-05 ENCOUNTER — Other Ambulatory Visit: Payer: Self-pay

## 2024-05-05 ENCOUNTER — Other Ambulatory Visit (HOSPITAL_COMMUNITY): Payer: Self-pay

## 2024-05-05 MED ORDER — SERTRALINE HCL 100 MG PO TABS
100.0000 mg | ORAL_TABLET | Freq: Every day | ORAL | 1 refills | Status: AC
  Filled 2024-05-23: qty 90, 90d supply, fill #0
  Filled 2024-05-26 – 2024-06-01 (×2): qty 30, 30d supply, fill #0
  Filled 2024-07-02: qty 30, 30d supply, fill #1

## 2024-05-05 MED ORDER — JARDIANCE 25 MG PO TABS: 25.0000 mg | ORAL_TABLET | Freq: Every day | ORAL | 3 refills | Status: AC

## 2024-05-05 MED ORDER — ACCU-CHEK GUIDE TEST VI STRP
ORAL_STRIP | 1 refills | Status: AC
  Filled 2024-05-05: qty 100, 50d supply, fill #0

## 2024-05-05 MED ORDER — ACCU-CHEK GUIDE ME W/DEVICE KIT
1.0000 | PACK | Freq: Every day | 0 refills | Status: AC
  Filled 2024-05-05: qty 1, 30d supply, fill #0

## 2024-05-08 ENCOUNTER — Other Ambulatory Visit (HOSPITAL_COMMUNITY): Payer: Self-pay

## 2024-05-12 ENCOUNTER — Other Ambulatory Visit (HOSPITAL_COMMUNITY): Payer: Self-pay

## 2024-05-14 ENCOUNTER — Other Ambulatory Visit (HOSPITAL_COMMUNITY): Payer: Self-pay

## 2024-05-15 ENCOUNTER — Other Ambulatory Visit (HOSPITAL_COMMUNITY): Payer: Self-pay

## 2024-05-15 MED ORDER — LEVETIRACETAM ER 500 MG PO TB24: 2000.0000 mg | ORAL_TABLET | Freq: Every day | ORAL | 3 refills | Status: AC

## 2024-05-15 MED ORDER — FOLIC ACID 1 MG PO TABS: 1.0000 mg | ORAL_TABLET | Freq: Every day | ORAL | 11 refills | Status: AC

## 2024-05-16 ENCOUNTER — Other Ambulatory Visit (HOSPITAL_COMMUNITY): Payer: Self-pay

## 2024-05-22 ENCOUNTER — Other Ambulatory Visit: Payer: Self-pay

## 2024-05-23 ENCOUNTER — Other Ambulatory Visit (HOSPITAL_COMMUNITY): Payer: Self-pay

## 2024-05-25 ENCOUNTER — Other Ambulatory Visit (HOSPITAL_COMMUNITY): Payer: Self-pay

## 2024-05-25 ENCOUNTER — Other Ambulatory Visit: Payer: Self-pay

## 2024-05-25 MED ORDER — PHENTERMINE HCL 37.5 MG PO TABS
18.7500 mg | ORAL_TABLET | Freq: Every day | ORAL | 0 refills | Status: DC
  Filled 2024-05-25: qty 15, 30d supply, fill #0

## 2024-05-26 ENCOUNTER — Other Ambulatory Visit: Payer: Self-pay

## 2024-05-26 ENCOUNTER — Other Ambulatory Visit (HOSPITAL_COMMUNITY): Payer: Self-pay

## 2024-05-26 MED ORDER — MOUNJARO 7.5 MG/0.5ML ~~LOC~~ SOAJ
7.5000 mg | SUBCUTANEOUS | 1 refills | Status: AC
  Filled 2024-05-26 – 2024-06-29 (×4): qty 2, 28d supply, fill #0

## 2024-05-27 ENCOUNTER — Other Ambulatory Visit (HOSPITAL_COMMUNITY): Payer: Self-pay

## 2024-05-29 ENCOUNTER — Other Ambulatory Visit: Payer: Self-pay

## 2024-06-01 ENCOUNTER — Other Ambulatory Visit: Payer: Self-pay

## 2024-06-01 ENCOUNTER — Other Ambulatory Visit (HOSPITAL_COMMUNITY): Payer: Self-pay

## 2024-06-01 ENCOUNTER — Other Ambulatory Visit (HOSPITAL_BASED_OUTPATIENT_CLINIC_OR_DEPARTMENT_OTHER): Payer: Self-pay

## 2024-06-01 MED ORDER — LOSARTAN POTASSIUM 25 MG PO TABS
12.5000 mg | ORAL_TABLET | ORAL | 0 refills | Status: AC
  Filled 2024-06-01: qty 15, 30d supply, fill #0
  Filled 2024-07-01: qty 15, 30d supply, fill #1

## 2024-06-02 ENCOUNTER — Other Ambulatory Visit: Payer: Self-pay

## 2024-06-02 ENCOUNTER — Other Ambulatory Visit (HOSPITAL_COMMUNITY): Payer: Self-pay

## 2024-06-02 MED ORDER — JARDIANCE 25 MG PO TABS: 25.0000 mg | ORAL_TABLET | Freq: Every day | ORAL | 5 refills | Status: AC

## 2024-06-02 MED ORDER — FLUCONAZOLE 150 MG PO TABS
ORAL_TABLET | ORAL | 0 refills | Status: AC
  Filled 2024-06-02: qty 2, 2d supply, fill #0

## 2024-06-03 ENCOUNTER — Other Ambulatory Visit (HOSPITAL_COMMUNITY): Payer: Self-pay

## 2024-06-03 ENCOUNTER — Other Ambulatory Visit: Payer: Self-pay

## 2024-06-04 ENCOUNTER — Other Ambulatory Visit (HOSPITAL_COMMUNITY): Payer: Self-pay

## 2024-06-04 ENCOUNTER — Other Ambulatory Visit: Payer: Self-pay

## 2024-06-04 MED ORDER — METRONIDAZOLE 500 MG PO TABS
500.0000 mg | ORAL_TABLET | Freq: Two times a day (BID) | ORAL | 0 refills | Status: AC
  Filled 2024-06-04: qty 14, 7d supply, fill #0

## 2024-06-05 ENCOUNTER — Other Ambulatory Visit (HOSPITAL_COMMUNITY): Payer: Self-pay

## 2024-06-05 ENCOUNTER — Other Ambulatory Visit: Payer: Self-pay

## 2024-06-18 ENCOUNTER — Other Ambulatory Visit: Payer: Self-pay

## 2024-06-21 ENCOUNTER — Other Ambulatory Visit (HOSPITAL_COMMUNITY): Payer: Self-pay

## 2024-06-22 ENCOUNTER — Other Ambulatory Visit (HOSPITAL_COMMUNITY): Payer: Self-pay

## 2024-06-29 ENCOUNTER — Other Ambulatory Visit: Payer: Self-pay

## 2024-06-29 ENCOUNTER — Other Ambulatory Visit (HOSPITAL_COMMUNITY): Payer: Self-pay

## 2024-06-29 ENCOUNTER — Ambulatory Visit (HOSPITAL_COMMUNITY): Payer: MEDICAID

## 2024-06-29 DIAGNOSIS — F411 Generalized anxiety disorder: Secondary | ICD-10-CM

## 2024-06-29 DIAGNOSIS — F332 Major depressive disorder, recurrent severe without psychotic features: Secondary | ICD-10-CM
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
## 2024-06-30 ENCOUNTER — Other Ambulatory Visit (HOSPITAL_COMMUNITY): Payer: Self-pay

## 2024-06-30 MED ORDER — PHENTERMINE HCL 37.5 MG PO TABS
18.7500 mg | ORAL_TABLET | Freq: Every day | ORAL | 0 refills | Status: AC
  Filled 2024-06-30: qty 15, 30d supply, fill #0

## 2024-07-01 ENCOUNTER — Other Ambulatory Visit: Payer: Self-pay

## 2024-07-02 ENCOUNTER — Other Ambulatory Visit: Payer: Self-pay

## 2024-07-03 ENCOUNTER — Other Ambulatory Visit (HOSPITAL_COMMUNITY): Payer: Self-pay

## 2024-07-03 ENCOUNTER — Other Ambulatory Visit: Payer: Self-pay

## 2024-07-03 ENCOUNTER — Telehealth (HOSPITAL_COMMUNITY): Payer: Self-pay

## 2024-07-03 NOTE — Telephone Encounter (Signed)
 Call Answered - Informed client to complete ROI for request of medical records. Client agreed to have information released.

## 2024-07-15 ENCOUNTER — Ambulatory Visit (HOSPITAL_COMMUNITY): Payer: MEDICAID

## 2024-09-24 ENCOUNTER — Ambulatory Visit (HOSPITAL_COMMUNITY): Payer: MEDICAID | Admitting: Psychiatry
